# Patient Record
Sex: Male | Born: 1945
Health system: Southern US, Community
[De-identification: ages and names within clinical notes are randomized; demographics above are authoritative.]

## PROBLEM LIST (undated history)

## (undated) DIAGNOSIS — N320 Bladder-neck obstruction: Secondary | ICD-10-CM

## (undated) DIAGNOSIS — G40909 Epilepsy, unspecified, not intractable, without status epilepticus: Secondary | ICD-10-CM

## (undated) DIAGNOSIS — E7800 Pure hypercholesterolemia, unspecified: Secondary | ICD-10-CM

## (undated) DIAGNOSIS — C4491 Basal cell carcinoma of skin, unspecified: Secondary | ICD-10-CM

## (undated) DIAGNOSIS — G2581 Restless legs syndrome: Secondary | ICD-10-CM

## (undated) DIAGNOSIS — K5090 Crohn's disease, unspecified, without complications: Secondary | ICD-10-CM

## (undated) DIAGNOSIS — K219 Gastro-esophageal reflux disease without esophagitis: Secondary | ICD-10-CM

## (undated) DIAGNOSIS — J45909 Unspecified asthma, uncomplicated: Secondary | ICD-10-CM

## (undated) DIAGNOSIS — G4730 Sleep apnea, unspecified: Secondary | ICD-10-CM

## (undated) HISTORY — DX: Unspecified asthma, uncomplicated: J45.909

## (undated) HISTORY — DX: Basal cell carcinoma of skin, unspecified: C44.91

## (undated) HISTORY — DX: Gastro-esophageal reflux disease without esophagitis: K21.9

## (undated) HISTORY — DX: Bladder-neck obstruction: N32.0

## (undated) HISTORY — DX: Epilepsy, unspecified, not intractable, without status epilepticus: G40.909

## (undated) HISTORY — DX: Pure hypercholesterolemia, unspecified: E78.00

## (undated) HISTORY — DX: Sleep apnea, unspecified: G47.30

## (undated) HISTORY — DX: Crohn's disease, unspecified, without complications: K50.90

## (undated) HISTORY — DX: Restless legs syndrome: G25.81

---
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
## 1980-04-30 HISTORY — PX: KNEE ARTHROSCOPY: SUR90

## 1990-04-30 HISTORY — PX: APPENDECTOMY: SHX54

## 1997-04-30 HISTORY — PX: OTHER SURGICAL HISTORY: SHX169

## 2004-11-22 ENCOUNTER — Emergency Department: Payer: Self-pay | Admitting: General Practice

## 2004-11-23 ENCOUNTER — Ambulatory Visit: Payer: Self-pay | Admitting: General Practice

## 2005-05-02 ENCOUNTER — Ambulatory Visit: Payer: Self-pay | Admitting: Internal Medicine

## 2005-05-11 ENCOUNTER — Ambulatory Visit: Payer: Self-pay | Admitting: Internal Medicine

## 2005-07-20 ENCOUNTER — Ambulatory Visit: Payer: Self-pay | Admitting: Internal Medicine

## 2008-06-29 ENCOUNTER — Ambulatory Visit: Payer: Self-pay | Admitting: Unknown Physician Specialty

## 2010-09-27 ENCOUNTER — Ambulatory Visit: Payer: Self-pay | Admitting: Internal Medicine

## 2011-02-15 ENCOUNTER — Ambulatory Visit: Payer: Self-pay | Admitting: Oncology

## 2011-02-16 ENCOUNTER — Ambulatory Visit: Payer: Self-pay | Admitting: Oncology

## 2011-03-01 ENCOUNTER — Ambulatory Visit: Payer: Self-pay | Admitting: Oncology

## 2011-03-09 LAB — PATHOLOGY REPORT

## 2011-03-31 ENCOUNTER — Ambulatory Visit: Payer: Self-pay | Admitting: Oncology

## 2012-01-11 DIAGNOSIS — D131 Benign neoplasm of stomach: Secondary | ICD-10-CM | POA: Diagnosis not present

## 2012-01-11 DIAGNOSIS — K2940 Chronic atrophic gastritis without bleeding: Secondary | ICD-10-CM | POA: Diagnosis not present

## 2012-01-11 DIAGNOSIS — K2210 Ulcer of esophagus without bleeding: Secondary | ICD-10-CM | POA: Diagnosis not present

## 2012-02-02 DIAGNOSIS — J019 Acute sinusitis, unspecified: Secondary | ICD-10-CM | POA: Diagnosis not present

## 2012-02-02 DIAGNOSIS — J209 Acute bronchitis, unspecified: Secondary | ICD-10-CM | POA: Diagnosis not present

## 2012-02-11 ENCOUNTER — Telehealth: Payer: Self-pay | Admitting: Internal Medicine

## 2012-02-11 MED ORDER — PRAMIPEXOLE DIHYDROCHLORIDE 0.25 MG PO TABS: ORAL_TABLET | ORAL | Status: DC

## 2012-02-11 NOTE — Telephone Encounter (Signed)
Ok to refill x 3 

## 2012-02-11 NOTE — Telephone Encounter (Signed)
Pt called checking on his rx that pharmcy sent over [facility] [address] Pramipexole 0.25mg   Take 1 1/2 per day

## 2012-02-26 DIAGNOSIS — H2589 Other age-related cataract: Secondary | ICD-10-CM | POA: Diagnosis not present

## 2012-03-06 DIAGNOSIS — H2589 Other age-related cataract: Secondary | ICD-10-CM | POA: Diagnosis not present

## 2012-04-07 DIAGNOSIS — N281 Cyst of kidney, acquired: Secondary | ICD-10-CM | POA: Insufficient documentation

## 2012-04-07 DIAGNOSIS — R972 Elevated prostate specific antigen [PSA]: Secondary | ICD-10-CM | POA: Insufficient documentation

## 2012-04-07 DIAGNOSIS — N401 Enlarged prostate with lower urinary tract symptoms: Secondary | ICD-10-CM | POA: Insufficient documentation

## 2012-04-07 DIAGNOSIS — N411 Chronic prostatitis: Secondary | ICD-10-CM | POA: Insufficient documentation

## 2012-04-09 DIAGNOSIS — N281 Cyst of kidney, acquired: Secondary | ICD-10-CM | POA: Diagnosis not present

## 2012-04-09 DIAGNOSIS — R972 Elevated prostate specific antigen [PSA]: Secondary | ICD-10-CM | POA: Diagnosis not present

## 2012-04-09 DIAGNOSIS — N411 Chronic prostatitis: Secondary | ICD-10-CM | POA: Diagnosis not present

## 2012-04-09 DIAGNOSIS — N401 Enlarged prostate with lower urinary tract symptoms: Secondary | ICD-10-CM | POA: Diagnosis not present

## 2012-04-29 ENCOUNTER — Encounter: Payer: Self-pay | Admitting: Internal Medicine

## 2012-04-29 ENCOUNTER — Ambulatory Visit (INDEPENDENT_AMBULATORY_CARE_PROVIDER_SITE_OTHER): Payer: Medicare Other | Admitting: Internal Medicine

## 2012-04-29 VITALS — BP 112/68 | HR 63 | Temp 98.2°F | Ht 69.0 in | Wt 232.5 lb

## 2012-04-29 DIAGNOSIS — K219 Gastro-esophageal reflux disease without esophagitis: Secondary | ICD-10-CM | POA: Insufficient documentation

## 2012-04-29 DIAGNOSIS — G4730 Sleep apnea, unspecified: Secondary | ICD-10-CM | POA: Insufficient documentation

## 2012-04-29 DIAGNOSIS — M810 Age-related osteoporosis without current pathological fracture: Secondary | ICD-10-CM | POA: Diagnosis not present

## 2012-04-29 DIAGNOSIS — R945 Abnormal results of liver function studies: Secondary | ICD-10-CM

## 2012-04-29 DIAGNOSIS — J45909 Unspecified asthma, uncomplicated: Secondary | ICD-10-CM

## 2012-04-29 DIAGNOSIS — R635 Abnormal weight gain: Secondary | ICD-10-CM

## 2012-04-29 DIAGNOSIS — G2581 Restless legs syndrome: Secondary | ICD-10-CM

## 2012-04-29 DIAGNOSIS — R7989 Other specified abnormal findings of blood chemistry: Secondary | ICD-10-CM

## 2012-04-29 DIAGNOSIS — E7800 Pure hypercholesterolemia, unspecified: Secondary | ICD-10-CM | POA: Diagnosis not present

## 2012-05-05 ENCOUNTER — Other Ambulatory Visit (INDEPENDENT_AMBULATORY_CARE_PROVIDER_SITE_OTHER): Payer: Medicare Other

## 2012-05-05 DIAGNOSIS — R635 Abnormal weight gain: Secondary | ICD-10-CM

## 2012-05-05 DIAGNOSIS — E7800 Pure hypercholesterolemia, unspecified: Secondary | ICD-10-CM | POA: Diagnosis not present

## 2012-05-05 DIAGNOSIS — M810 Age-related osteoporosis without current pathological fracture: Secondary | ICD-10-CM

## 2012-05-05 LAB — CBC WITH DIFFERENTIAL/PLATELET
Basophils Relative: 1 % (ref 0.0–3.0)
Eosinophils Absolute: 0.2 10*3/uL (ref 0.0–0.7)
HCT: 40.8 % (ref 39.0–52.0)
Hemoglobin: 13.7 g/dL (ref 13.0–17.0)
Lymphs Abs: 1.2 10*3/uL (ref 0.7–4.0)
MCHC: 33.5 g/dL (ref 30.0–36.0)
MCV: 86.8 fl (ref 78.0–100.0)
Monocytes Absolute: 0.5 10*3/uL (ref 0.1–1.0)
Neutro Abs: 2.2 10*3/uL (ref 1.4–7.7)
RBC: 4.7 Mil/uL (ref 4.22–5.81)

## 2012-05-05 LAB — COMPREHENSIVE METABOLIC PANEL
AST: 59 U/L — ABNORMAL HIGH (ref 0–37)
Alkaline Phosphatase: 64 U/L (ref 39–117)
BUN: 15 mg/dL (ref 6–23)
Calcium: 9 mg/dL (ref 8.4–10.5)
Chloride: 103 mEq/L (ref 96–112)
Creatinine, Ser: 1.2 mg/dL (ref 0.4–1.5)
Total Bilirubin: 0.7 mg/dL (ref 0.3–1.2)

## 2012-05-05 LAB — LIPID PANEL
Cholesterol: 232 mg/dL — ABNORMAL HIGH (ref 0–200)
Total CHOL/HDL Ratio: 5
Triglycerides: 127 mg/dL (ref 0.0–149.0)

## 2012-05-06 ENCOUNTER — Encounter: Payer: Self-pay | Admitting: Internal Medicine

## 2012-05-06 ENCOUNTER — Telehealth: Payer: Self-pay | Admitting: Internal Medicine

## 2012-05-06 DIAGNOSIS — R7989 Other specified abnormal findings of blood chemistry: Secondary | ICD-10-CM

## 2012-05-06 DIAGNOSIS — D72819 Decreased white blood cell count, unspecified: Secondary | ICD-10-CM

## 2012-05-06 DIAGNOSIS — R945 Abnormal results of liver function studies: Secondary | ICD-10-CM

## 2012-05-06 NOTE — Telephone Encounter (Signed)
Pt notified of labs (via my chart) and need of a follow up non fasting lab within the next 2-3 weeks.  Please schedule an appt time for him to come in for non fasting labs and notify him of time.  Thanks.

## 2012-05-06 NOTE — Telephone Encounter (Signed)
Appointment 1/22 @ 3  Pt aware of appointment

## 2012-05-19 ENCOUNTER — Encounter: Payer: Self-pay | Admitting: Internal Medicine

## 2012-05-19 DIAGNOSIS — M810 Age-related osteoporosis without current pathological fracture: Secondary | ICD-10-CM | POA: Insufficient documentation

## 2012-05-19 DIAGNOSIS — R945 Abnormal results of liver function studies: Secondary | ICD-10-CM | POA: Insufficient documentation

## 2012-05-19 DIAGNOSIS — R7989 Other specified abnormal findings of blood chemistry: Secondary | ICD-10-CM | POA: Insufficient documentation

## 2012-05-19 NOTE — Assessment & Plan Note (Signed)
Low cholesterol diet.  Desires not to take cholesterol medication.  Check lipid panel.  

## 2012-05-19 NOTE — Assessment & Plan Note (Signed)
Recheck liver panel to confirm stable.

## 2012-05-19 NOTE — Assessment & Plan Note (Signed)
Doing well.  Continue inhalers.  Follow.   

## 2012-05-19 NOTE — Assessment & Plan Note (Signed)
CPAP.  [place].    

## 2012-05-19 NOTE — Assessment & Plan Note (Signed)
Takes calcium and vitamin D.  Desires no other medication.  Recheck bone density.  Check vitamin D level.

## 2012-05-19 NOTE — Assessment & Plan Note (Signed)
Symptoms controlled.  Follow.  Ulceration healed.    

## 2012-05-19 NOTE — Assessment & Plan Note (Signed)
On Requip.  Doing well.  Follow.   

## 2012-05-19 NOTE — Progress Notes (Signed)
  Subjective:    Patient ID: Joseph Kitten., male    DOB: 1945/05/19, 67 y.o.   MRN: [id number]  HPI 67 year old male with past history of asthma/reactive airways disease, sleep apnea, restless leg syndrome and hypercholesterolemia who comes in today for a physical exam.  He states he has been doing well.  The previous ulceration - healed.  He is eating and drinking well.  Not watching his diet.  No sob.  No chest pain or tightness.  Bowels doing well.  Has been seeing Dr Achilles Dunk for elevated PSA.  Highest 8.6.  Treated with cipro - 6.1.  Biopsy negative.  Treated with finasteride - psa 4.3.  Overall he feels good.  Had cataract extraction.  Doing well.    Past Medical History  Diagnosis Date  . Asthma   . Hypercholesterolemia   . Bladder outlet obstruction   . GERD (gastroesophageal reflux disease)     schatzki ring  . Skin cancer, basal cell   . Crohn's disease     appendix, s/p appendectomy  . Restless leg syndrome   . Seizure disorder     age 57, previously on phenobarbital  . Sleep apnea     Review of Systems Patient denies any headache, lightheadedness or dizziness.  No sinus or allergy symptoms.  No chest pain, tightness or palpitations.  No increased shortness of breath, cough or congestion.  No nausea or vomiting.  No acid reflux.  No dysphagia.  No abdominal pain or cramping.  No bowel change, such as diarrhea, constipation, BRBPR or melana.  No urine change.        Objective:   Physical Exam Filed Vitals:   04/29/12 1520  BP: 112/68  Pulse: 63  Temp: 98.2 F (73.39 C)   67 year old male in no acute distress.  HEENT:  Nares - clear.  Oropharynx - without lesions. NECK:  Supple.  Nontender.  No audible carotid bruit.  HEART:  Appears to be regular.   LUNGS:  No crackles or wheezing audible.  Respirations even and unlabored.   RADIAL PULSE:  Equal bilaterally.  ABDOMEN:  Soft.  Nontender.  Bowel sounds present and normal.  No audible abdominal bruit.  GU:  Exam  performed by Dr Achilles Dunk.    EXTREMITIES:  No increased edema present.  DP pulses palpable and equal bilaterally.         Assessment & Plan:  ABDOMINAL WALL HERNIA.  Repair by Dr Ashley Murrain.  Doing well.    CARDIOVASCULAR.  Asymptomatic.    HEALTH MAINTENANCE.  Physical today.  PSA and prostate checks - followed by Dr Achilles Dunk.  Colonoscopy just performed 2013.

## 2012-05-20 ENCOUNTER — Other Ambulatory Visit: Payer: Medicare Other

## 2012-05-21 ENCOUNTER — Other Ambulatory Visit: Payer: Medicare Other

## 2012-05-21 ENCOUNTER — Other Ambulatory Visit (INDEPENDENT_AMBULATORY_CARE_PROVIDER_SITE_OTHER): Payer: Medicare Other

## 2012-05-21 DIAGNOSIS — M810 Age-related osteoporosis without current pathological fracture: Secondary | ICD-10-CM | POA: Diagnosis not present

## 2012-05-21 DIAGNOSIS — R7989 Other specified abnormal findings of blood chemistry: Secondary | ICD-10-CM | POA: Diagnosis not present

## 2012-05-21 DIAGNOSIS — R945 Abnormal results of liver function studies: Secondary | ICD-10-CM

## 2012-05-21 DIAGNOSIS — D72819 Decreased white blood cell count, unspecified: Secondary | ICD-10-CM

## 2012-05-21 LAB — HEPATIC FUNCTION PANEL
Alkaline Phosphatase: 69 U/L (ref 39–117)
Bilirubin, Direct: 0 mg/dL (ref 0.0–0.3)
Total Bilirubin: 0.6 mg/dL (ref 0.3–1.2)

## 2012-05-21 LAB — CBC WITH DIFFERENTIAL/PLATELET
Basophils Absolute: 0 10*3/uL (ref 0.0–0.1)
Basophils Relative: 0.7 % (ref 0.0–3.0)
Eosinophils Absolute: 0.1 10*3/uL (ref 0.0–0.7)
Lymphocytes Relative: 34.4 % (ref 12.0–46.0)
MCHC: 33.6 g/dL (ref 30.0–36.0)
Neutrophils Relative %: 54.2 % (ref 43.0–77.0)
Platelets: 235 10*3/uL (ref 150.0–400.0)
RBC: 4.66 Mil/uL (ref 4.22–5.81)
RDW: 15.7 % — ABNORMAL HIGH (ref 11.5–14.6)

## 2012-05-22 ENCOUNTER — Telehealth: Payer: Self-pay | Admitting: Internal Medicine

## 2012-05-22 DIAGNOSIS — E7800 Pure hypercholesterolemia, unspecified: Secondary | ICD-10-CM

## 2012-05-22 DIAGNOSIS — R945 Abnormal results of liver function studies: Secondary | ICD-10-CM

## 2012-05-22 DIAGNOSIS — R7989 Other specified abnormal findings of blood chemistry: Secondary | ICD-10-CM

## 2012-05-22 NOTE — Telephone Encounter (Signed)
Pt aware of appointment 

## 2012-05-22 NOTE — Telephone Encounter (Signed)
I notified the pt of his labs via my chart.  He needs a follow up lab appt 1-2 days before his 10/17/12 appt.  Please schedule a fasting lab appt and notify him of the date and time.  Thanks.

## 2012-05-22 NOTE — Telephone Encounter (Signed)
Left message for pt to call office needs lab appointment prior to appointment 10/17/12

## 2012-05-22 NOTE — Telephone Encounter (Signed)
Scheduled

## 2012-05-27 ENCOUNTER — Telehealth: Payer: Self-pay | Admitting: Internal Medicine

## 2012-05-27 NOTE — Telephone Encounter (Signed)
Pt notified of bone density results via my chart.  (osteoporosis - improvement)

## 2012-06-04 ENCOUNTER — Encounter: Payer: Self-pay | Admitting: Internal Medicine

## 2012-06-14 ENCOUNTER — Other Ambulatory Visit: Payer: Self-pay

## 2012-07-25 ENCOUNTER — Ambulatory Visit: Payer: Federal, State, Local not specified - PPO | Admitting: Internal Medicine

## 2012-08-28 DIAGNOSIS — R945 Abnormal results of liver function studies: Secondary | ICD-10-CM | POA: Diagnosis not present

## 2012-08-28 DIAGNOSIS — Z713 Dietary counseling and surveillance: Secondary | ICD-10-CM | POA: Diagnosis not present

## 2012-08-28 DIAGNOSIS — R197 Diarrhea, unspecified: Secondary | ICD-10-CM | POA: Diagnosis not present

## 2012-08-28 DIAGNOSIS — K5090 Crohn's disease, unspecified, without complications: Secondary | ICD-10-CM | POA: Diagnosis not present

## 2012-09-03 ENCOUNTER — Ambulatory Visit: Payer: Medicare Other | Admitting: Internal Medicine

## 2012-09-05 ENCOUNTER — Other Ambulatory Visit: Payer: Self-pay | Admitting: *Deleted

## 2012-09-05 MED ORDER — PRAMIPEXOLE DIHYDROCHLORIDE 0.25 MG PO TABS: ORAL_TABLET | ORAL | Status: DC

## 2012-09-05 NOTE — Telephone Encounter (Signed)
Please Advise.... Pt has not been to be seen since 03/2012

## 2012-09-10 DIAGNOSIS — L738 Other specified follicular disorders: Secondary | ICD-10-CM | POA: Diagnosis not present

## 2012-09-10 DIAGNOSIS — Z1283 Encounter for screening for malignant neoplasm of skin: Secondary | ICD-10-CM | POA: Diagnosis not present

## 2012-09-10 DIAGNOSIS — L821 Other seborrheic keratosis: Secondary | ICD-10-CM | POA: Diagnosis not present

## 2012-09-10 DIAGNOSIS — D239 Other benign neoplasm of skin, unspecified: Secondary | ICD-10-CM | POA: Diagnosis not present

## 2012-09-22 ENCOUNTER — Other Ambulatory Visit: Payer: Self-pay | Admitting: Internal Medicine

## 2012-09-23 NOTE — Telephone Encounter (Signed)
Rx sent to pharmacy by escript  

## 2012-10-10 ENCOUNTER — Encounter: Payer: Self-pay | Admitting: Internal Medicine

## 2012-10-10 ENCOUNTER — Other Ambulatory Visit (INDEPENDENT_AMBULATORY_CARE_PROVIDER_SITE_OTHER): Payer: Medicare Other

## 2012-10-10 DIAGNOSIS — E7800 Pure hypercholesterolemia, unspecified: Secondary | ICD-10-CM | POA: Diagnosis not present

## 2012-10-10 DIAGNOSIS — R7989 Other specified abnormal findings of blood chemistry: Secondary | ICD-10-CM | POA: Diagnosis not present

## 2012-10-10 DIAGNOSIS — R945 Abnormal results of liver function studies: Secondary | ICD-10-CM

## 2012-10-10 LAB — HEPATIC FUNCTION PANEL
AST: 46 U/L — ABNORMAL HIGH (ref 0–37)
Albumin: 3.4 g/dL — ABNORMAL LOW (ref 3.5–5.2)
Alkaline Phosphatase: 68 U/L (ref 39–117)
Total Bilirubin: 0.8 mg/dL (ref 0.3–1.2)

## 2012-10-10 LAB — LIPID PANEL
HDL: 41.9 mg/dL (ref >39.00)
LDL Cholesterol: 130 mg/dL — ABNORMAL HIGH (ref 0–99)
Total CHOL/HDL Ratio: 5
Triglycerides: 127 mg/dL (ref 0.0–149.0)
VLDL: 25.4 mg/dL (ref 0.0–40.0)

## 2012-10-17 ENCOUNTER — Ambulatory Visit: Payer: Medicare Other | Admitting: Internal Medicine

## 2012-10-22 DIAGNOSIS — N281 Cyst of kidney, acquired: Secondary | ICD-10-CM | POA: Diagnosis not present

## 2012-10-22 DIAGNOSIS — N411 Chronic prostatitis: Secondary | ICD-10-CM | POA: Diagnosis not present

## 2012-10-22 DIAGNOSIS — N401 Enlarged prostate with lower urinary tract symptoms: Secondary | ICD-10-CM | POA: Diagnosis not present

## 2012-10-22 DIAGNOSIS — R972 Elevated prostate specific antigen [PSA]: Secondary | ICD-10-CM | POA: Diagnosis not present

## 2012-10-22 DIAGNOSIS — N139 Obstructive and reflux uropathy, unspecified: Secondary | ICD-10-CM | POA: Diagnosis not present

## 2012-11-10 DIAGNOSIS — R197 Diarrhea, unspecified: Secondary | ICD-10-CM | POA: Diagnosis not present

## 2012-11-13 ENCOUNTER — Encounter: Payer: Self-pay | Admitting: Internal Medicine

## 2012-11-13 ENCOUNTER — Ambulatory Visit (INDEPENDENT_AMBULATORY_CARE_PROVIDER_SITE_OTHER): Payer: Medicare Other | Admitting: Internal Medicine

## 2012-11-13 VITALS — BP 130/90 | HR 58 | Temp 97.9°F | Ht 69.0 in | Wt 219.5 lb

## 2012-11-13 DIAGNOSIS — E7800 Pure hypercholesterolemia, unspecified: Secondary | ICD-10-CM

## 2012-11-13 DIAGNOSIS — M810 Age-related osteoporosis without current pathological fracture: Secondary | ICD-10-CM

## 2012-11-13 DIAGNOSIS — K219 Gastro-esophageal reflux disease without esophagitis: Secondary | ICD-10-CM | POA: Diagnosis not present

## 2012-11-13 DIAGNOSIS — G2581 Restless legs syndrome: Secondary | ICD-10-CM

## 2012-11-13 DIAGNOSIS — R7989 Other specified abnormal findings of blood chemistry: Secondary | ICD-10-CM

## 2012-11-13 DIAGNOSIS — R5383 Other fatigue: Secondary | ICD-10-CM

## 2012-11-13 DIAGNOSIS — G4730 Sleep apnea, unspecified: Secondary | ICD-10-CM

## 2012-11-13 DIAGNOSIS — R945 Abnormal results of liver function studies: Secondary | ICD-10-CM

## 2012-11-13 DIAGNOSIS — R5381 Other malaise: Secondary | ICD-10-CM

## 2012-11-15 ENCOUNTER — Encounter: Payer: Self-pay | Admitting: Internal Medicine

## 2012-11-15 NOTE — Assessment & Plan Note (Signed)
Symptoms controlled.  Follow.  Ulceration healed.    

## 2012-11-15 NOTE — Assessment & Plan Note (Signed)
CPAP.  [place].    

## 2012-11-15 NOTE — Addendum Note (Signed)
Addended by: Charm Barges on: 11/15/2012 10:40 PM   Modules accepted: Orders

## 2012-11-15 NOTE — Assessment & Plan Note (Signed)
On Requip.  Doing well.  Follow.   

## 2012-11-15 NOTE — Assessment & Plan Note (Signed)
Low cholesterol diet.  Desires not to take cholesterol medication.  Follow lipid panel.  Lipid panel 10/10/12 revealed total cholesterol 197, triglycerides 127, HDL 42 and LDL 130.  Better.

## 2012-11-15 NOTE — Assessment & Plan Note (Signed)
Takes calcium and vitamin D.  Desires no other medication.  Follow vitamin D level.    

## 2012-11-15 NOTE — Assessment & Plan Note (Signed)
Doing well.  Continue inhalers.  Follow.   

## 2012-11-15 NOTE — Progress Notes (Signed)
Subjective:    Patient ID: Joseph Kitten., male    DOB: Jan 06, 1946, 67 y.o.   MRN: [id number]  HPI 67 year old male with past history of asthma/reactive airways disease, sleep apnea, restless leg syndrome and hypercholesterolemia who comes in today for a scheduled follow up.  He states he has been doing well.  The previous ulceration - healed.  He is eating and drinking well.  Trying to watch his diet.  Once a month for the previous five months, he  Has had some diarrhea.  Self limited.  First two episodes were associated with fever (101.8).  Minimal nausea.  Decreased appetite - during the episode.  No abdominal pain.  Once the flare has resolved, symptoms return to normal.  Saw Dr Barbie Banner [facility]).  Recently turned in stool studies.  Does not have results yet.  Better currently.  No sob.  No chest pain or tightness.  Has been seeing Dr Achilles Dunk for elevated PSA.  Highest 8.6.  Treated with cipro - 6.1.  Biopsy negative.  Treated with finasteride - psa 4.3.  Overall he feels good.  Had cataract extraction.  Doing well.  Has well water.  Tested negative.  He does have an area on his left foot.  Itches.  He scratches a lot.     Past Medical History  Diagnosis Date  . Asthma   . Hypercholesterolemia   . Bladder outlet obstruction   . GERD (gastroesophageal reflux disease)     schatzki ring  . Skin cancer, basal cell   . Crohn's disease     appendix, s/p appendectomy  . Restless leg syndrome   . Seizure disorder     age 38, previously on phenobarbital  . Sleep apnea     Outpatient Encounter Prescriptions as of 11/13/2012  Medication Sig Dispense Refill  . ADVAIR DISKUS 100-50 MCG/DOSE AEPB INHALE 1 PUFF EVERY DAY  180 each  0  . albuterol (PROVENTIL) 2 MG tablet Take 2 mg by mouth as needed.      . calcium gluconate 500 MG tablet Take 500 mg by mouth 2 (two) times daily.      . Cholecalciferol (VITAMIN D-3) 1000 UNITS CAPS Take 1 capsule by mouth daily.      . finasteride (PROSCAR) 5 MG  tablet Take 5 mg by mouth daily.      . Magnesium 250 MG TABS Two tablets q day      . omeprazole (PRILOSEC) 20 MG capsule Take 20 mg by mouth daily.      . pramipexole (MIRAPEX) 0.25 MG tablet Take 1 and 1/2 tablets daily  45 tablet  3  . [DISCONTINUED] aspirin 81 MG tablet Take 81 mg by mouth daily.      . [DISCONTINUED] fish oil-omega-3 fatty acids 1000 MG capsule Take 1 g by mouth daily.      . [DISCONTINUED] Flaxseed, Linseed, (FLAX SEED OIL) 1000 MG CAPS Take 1 capsule by mouth 2 (two) times daily.       No facility-administered encounter medications on file as of 11/13/2012.    Review of Systems Patient denies any headache, lightheadedness or dizziness.  No sinus or allergy symptoms.  No chest pain, tightness or palpitations.  No increased shortness of breath, cough or congestion.  Minimal nausea.  Decreased appetite - associated with these episodes.  No acid reflux.  No dysphagia.  No abdominal pain or cramping.  No bowel change, such as constipation, BRBPR or melana.  Does report some  intermittent diarrhea as outlined.  Left foot itching as outlined.  No urine change.        Objective:   Physical Exam  Filed Vitals:   11/13/12 1111  BP: 130/90  Pulse: 58  Temp: 97.9 F (36.6 C)   Blood pressure recheck;  132/78, pulse 90  67 year old male in no acute distress.  HEENT:  Nares - clear.  Oropharynx - without lesions. NECK:  Supple.  Nontender.  No audible carotid bruit.  HEART:  Appears to be regular.   LUNGS:  No crackles or wheezing audible.  Respirations even and unlabored.   RADIAL PULSE:  Equal bilaterally.  ABDOMEN:  Soft.  Nontender.  Bowel sounds present and normal.  No audible abdominal bruit.    EXTREMITIES:  No increased edema present.  DP pulses palpable and equal bilaterally.         Assessment & Plan:  DIARRHEA.  Persistent intermittent episodes.  Add Align daily.  Await stool test results.  Continue to follow up with Dr Barbie Banner.    LEFT FOOT ITCHING.   Unclear as the exact etiology. Apply Lotrimin cream bid.  Avoid scratching.  Keep dry.   ABDOMINAL WALL HERNIA.  Repair by Dr Ashley Murrain.  Doing well.    CARDIOVASCULAR.  Asymptomatic.    HEALTH MAINTENANCE.  Physical last visit.  PSA and prostate checks - followed by Dr Achilles Dunk.  Colonoscopy just performed 2013.

## 2012-11-15 NOTE — Assessment & Plan Note (Signed)
Follow liver panel to confirm stable.   

## 2012-11-30 ENCOUNTER — Other Ambulatory Visit: Payer: Self-pay | Admitting: Internal Medicine

## 2012-12-03 ENCOUNTER — Other Ambulatory Visit: Payer: Self-pay

## 2013-01-05 DIAGNOSIS — Z961 Presence of intraocular lens: Secondary | ICD-10-CM | POA: Diagnosis not present

## 2013-01-20 ENCOUNTER — Encounter: Payer: Self-pay | Admitting: Adult Health

## 2013-01-20 ENCOUNTER — Ambulatory Visit (INDEPENDENT_AMBULATORY_CARE_PROVIDER_SITE_OTHER): Payer: Medicare Other | Admitting: Adult Health

## 2013-01-20 VITALS — BP 124/74 | HR 62 | Temp 98.9°F | Resp 12 | Wt 222.0 lb

## 2013-01-20 DIAGNOSIS — J329 Chronic sinusitis, unspecified: Secondary | ICD-10-CM

## 2013-01-20 MED ORDER — AMOXICILLIN-POT CLAVULANATE 875-125 MG PO TABS: 1.0000 | ORAL_TABLET | Freq: Two times a day (BID) | ORAL | Status: DC

## 2013-01-20 NOTE — Progress Notes (Signed)
  Subjective:    Patient ID: Joseph Good., male    DOB: 02-12-1946, 67 y.o.   MRN: [id number]  HPI  Thursday and Fri - sore throat and low grade fever Saturday - sinus congestions Yesterday began with some chest congestion and coughing up yellow. Coughing during the night  He has taken robitussin with some improvement.  Review of Systems  Constitutional: Positive for fever.  HENT: Positive for congestion, rhinorrhea, postnasal drip and sinus pressure. Negative for sore throat.   Respiratory: Positive for cough. Negative for shortness of breath and wheezing.   Cardiovascular: Negative.   Gastrointestinal: Negative.   Psychiatric/Behavioral: Negative.        Objective:   Physical Exam  Constitutional: He is oriented to person, place, and time. He appears well-developed and well-nourished. No distress.  HENT:  Head: Normocephalic and atraumatic.  Mouth/Throat: No oropharyngeal exudate.  Cardiovascular: Normal rate, regular rhythm, normal heart sounds and intact distal pulses.  Exam reveals no gallop and no friction rub.   No murmur heard. Pulmonary/Chest: Effort normal and breath sounds normal. No respiratory distress. He has no wheezes. He has no rales.  Lymphadenopathy:    He has no cervical adenopathy.  Neurological: He is alert and oriented to person, place, and time.  Psychiatric: He has a normal mood and affect. His behavior is normal. Judgment and thought content normal.          Assessment & Plan:

## 2013-01-20 NOTE — Patient Instructions (Addendum)
  Start augmentin twice a day for 10 days  Continue Robitussin for cough and expectorant   Please call if no improvement in 3-4 days.

## 2013-01-20 NOTE — Assessment & Plan Note (Signed)
Start Augmentin bid x 10 days. Continue with Robitussin for cough. RTC if no improvement in 3-4 days.

## 2013-01-21 ENCOUNTER — Telehealth: Payer: Self-pay | Admitting: Internal Medicine

## 2013-01-21 NOTE — Telephone Encounter (Signed)
Discussed with pt that a diagnosis is on his visit note.

## 2013-01-21 NOTE — Telephone Encounter (Signed)
Pt saw Raquel 9/23.  States on discharge paperwork the diagnosis section states "none".  Pt is concerned about this regarding insurance and would like a call.

## 2013-01-28 ENCOUNTER — Encounter: Payer: Self-pay | Admitting: Adult Health

## 2013-01-28 ENCOUNTER — Ambulatory Visit (INDEPENDENT_AMBULATORY_CARE_PROVIDER_SITE_OTHER): Payer: Medicare Other | Admitting: Adult Health

## 2013-01-28 ENCOUNTER — Ambulatory Visit (INDEPENDENT_AMBULATORY_CARE_PROVIDER_SITE_OTHER)
Admission: RE | Admit: 2013-01-28 | Discharge: 2013-01-28 | Disposition: A | Discharge status: Home / Self Care | Payer: Medicare Other | Source: Ambulatory Visit | Attending: Adult Health | Admitting: Adult Health

## 2013-01-28 VITALS — BP 122/72 | HR 60 | Temp 98.1°F | Resp 12 | Wt 225.0 lb

## 2013-01-28 DIAGNOSIS — R05 Cough: Secondary | ICD-10-CM

## 2013-01-28 DIAGNOSIS — R059 Cough, unspecified: Secondary | ICD-10-CM

## 2013-01-28 MED ORDER — LEVOFLOXACIN 500 MG PO TABS: 500.0000 mg | ORAL_TABLET | Freq: Every day | ORAL | Status: DC

## 2013-01-28 MED ORDER — PREDNISONE 10 MG PO TABS: ORAL_TABLET | ORAL | Status: DC

## 2013-01-28 NOTE — Progress Notes (Signed)
  Subjective:    Patient ID: Joseph Good., male    DOB: 23-Jan-1946, 67 y.o.   MRN: [id number]  HPI  Patient is a pleasant 67 year old male who presents to clinic with complaints of little improvement on Augmentin. He is still coughing. On Augmentin x 8 days. He was last seen in clinic on 01/20/2013 and started on Augmentin for what appeared to be symptoms of sinusitis. During previous visit patient was complaining of sore throat, low-grade fever, sinus congestion as well as some chest congestion with productive cough of yellow sputum. Some of those symptoms have improved however the coughing persists. He no longer has a low-grade fever. He is still having sinus congestion and mild chest congestion.    Current Outpatient Prescriptions on File Prior to Visit  Medication Sig Dispense Refill  . ADVAIR DISKUS 100-50 MCG/DOSE AEPB INHALE 1 PUFF EVERY DAY  180 each  0  . albuterol (PROVENTIL) 2 MG tablet Take 2 mg by mouth as needed.      . calcium gluconate 500 MG tablet Take 500 mg by mouth 2 (two) times daily.      . Cholecalciferol (VITAMIN D-3) 1000 UNITS CAPS Take 1 capsule by mouth daily.      . finasteride (PROSCAR) 5 MG tablet Take 5 mg by mouth daily.      . Magnesium 250 MG TABS Two tablets q day      . omeprazole (PRILOSEC) 20 MG capsule Take 20 mg by mouth daily.      . pramipexole (MIRAPEX) 0.25 MG tablet TAKE 1 AND 1/2 TABLETS DAILY  45 tablet  5   No current facility-administered medications on file prior to visit.    Review of Systems  HENT: Positive for congestion, rhinorrhea, postnasal drip and sinus pressure.   Respiratory: Positive for cough. Negative for shortness of breath and wheezing.        Objective:   Physical Exam  Constitutional: He is oriented to person, place, and time. He appears well-developed and well-nourished. No distress.  Cardiovascular: Normal rate, regular rhythm and normal heart sounds.  Exam reveals no gallop and no friction rub.   No murmur  heard. Pulmonary/Chest: Effort normal and breath sounds normal. No respiratory distress. He has no wheezes. He has no rales.  Pt observed to have a congested cough.  Neurological: He is alert and oriented to person, place, and time.  Psychiatric: He has a normal mood and affect. His behavior is normal. Judgment and thought content normal.    BP 122/72  Pulse 60  Temp(Src) 98.1 F (36.7 C) (Oral)  Resp 12  Wt 225 lb (102.059 kg)  BMI 33.21 kg/m2  SpO2 97%       Assessment & Plan:

## 2013-01-28 NOTE — Patient Instructions (Addendum)
  Please go to our [facility] for chest xray.  I will notify you of results once I obtain them.  Stop Augmenting and start Levaquin 500 mg for 10 days.  Also start prednisone taper: Start with 60 mg (6 tablets) on the first day and taper by 10 mg (1 tablet) daily until complete  Drink plenty of fluids.  Albuterol inhaler every 6 hours for the next 3 days then as needed.  Continue with your cough syrup.

## 2013-01-28 NOTE — Assessment & Plan Note (Signed)
[facility] office for chest x-ray. Stop Augmentin and start Levaquin 500 mg daily x10 days. Continue with Robitussin for cough. Return to clinic if symptoms are not improved within 4-5 days.

## 2013-02-06 DIAGNOSIS — H5710 Ocular pain, unspecified eye: Secondary | ICD-10-CM | POA: Diagnosis not present

## 2013-03-05 ENCOUNTER — Other Ambulatory Visit: Payer: Self-pay

## 2013-05-08 ENCOUNTER — Other Ambulatory Visit: Payer: Self-pay | Admitting: Internal Medicine

## 2013-05-14 DIAGNOSIS — R972 Elevated prostate specific antigen [PSA]: Secondary | ICD-10-CM | POA: Diagnosis not present

## 2013-05-14 DIAGNOSIS — N401 Enlarged prostate with lower urinary tract symptoms: Secondary | ICD-10-CM | POA: Diagnosis not present

## 2013-05-14 DIAGNOSIS — N411 Chronic prostatitis: Secondary | ICD-10-CM | POA: Diagnosis not present

## 2013-05-14 DIAGNOSIS — N281 Cyst of kidney, acquired: Secondary | ICD-10-CM | POA: Diagnosis not present

## 2013-05-22 ENCOUNTER — Other Ambulatory Visit (INDEPENDENT_AMBULATORY_CARE_PROVIDER_SITE_OTHER): Payer: Medicare Other

## 2013-05-22 DIAGNOSIS — R5381 Other malaise: Secondary | ICD-10-CM | POA: Diagnosis not present

## 2013-05-22 DIAGNOSIS — R7989 Other specified abnormal findings of blood chemistry: Secondary | ICD-10-CM

## 2013-05-22 DIAGNOSIS — E7800 Pure hypercholesterolemia, unspecified: Secondary | ICD-10-CM

## 2013-05-22 DIAGNOSIS — G2581 Restless legs syndrome: Secondary | ICD-10-CM | POA: Diagnosis not present

## 2013-05-22 DIAGNOSIS — R5383 Other fatigue: Secondary | ICD-10-CM

## 2013-05-22 DIAGNOSIS — R945 Abnormal results of liver function studies: Secondary | ICD-10-CM

## 2013-05-22 LAB — COMPREHENSIVE METABOLIC PANEL
ALBUMIN: 3.8 g/dL (ref 3.5–5.2)
ALT: 23 U/L (ref 0–53)
AST: 57 U/L — ABNORMAL HIGH (ref 0–37)
Alkaline Phosphatase: 64 U/L (ref 39–117)
BUN: 13 mg/dL (ref 6–23)
CO2: 30 meq/L (ref 19–32)
Calcium: 9 mg/dL (ref 8.4–10.5)
Chloride: 103 mEq/L (ref 96–112)
Creatinine, Ser: 1.2 mg/dL (ref 0.4–1.5)
GFR: 63.1 mL/min (ref >60.00)
GLUCOSE: 86 mg/dL (ref 70–99)
POTASSIUM: 4.6 meq/L (ref 3.5–5.1)
SODIUM: 139 meq/L (ref 135–145)
TOTAL PROTEIN: 7.1 g/dL (ref 6.0–8.3)
Total Bilirubin: 1 mg/dL (ref 0.3–1.2)

## 2013-05-22 LAB — CBC WITH DIFFERENTIAL/PLATELET
BASOS PCT: 0.4 % (ref 0.0–3.0)
Basophils Absolute: 0 10*3/uL (ref 0.0–0.1)
EOS PCT: 3.3 % (ref 0.0–5.0)
Eosinophils Absolute: 0.2 10*3/uL (ref 0.0–0.7)
HCT: 45.5 % (ref 39.0–52.0)
Hemoglobin: 15.7 g/dL (ref 13.0–17.0)
Lymphocytes Relative: 25.4 % (ref 12.0–46.0)
Lymphs Abs: 1.2 10*3/uL (ref 0.7–4.0)
MCHC: 34.4 g/dL (ref 30.0–36.0)
MCV: 91.6 fl (ref 78.0–100.0)
MONO ABS: 0.5 10*3/uL (ref 0.1–1.0)
MONOS PCT: 11.3 % (ref 3.0–12.0)
Neutro Abs: 2.9 10*3/uL (ref 1.4–7.7)
Neutrophils Relative %: 59.6 % (ref 43.0–77.0)
PLATELETS: 230 10*3/uL (ref 150.0–400.0)
RBC: 4.97 Mil/uL (ref 4.22–5.81)
RDW: 13.8 % (ref 11.5–14.6)
WBC: 4.8 10*3/uL (ref 4.5–10.5)

## 2013-05-22 LAB — LIPID PANEL
Cholesterol: 213 mg/dL — ABNORMAL HIGH (ref 0–200)
HDL: 41.5 mg/dL (ref >39.00)
Total CHOL/HDL Ratio: 5
Triglycerides: 100 mg/dL (ref 0.0–149.0)
VLDL: 20 mg/dL (ref 0.0–40.0)

## 2013-05-22 LAB — TSH: TSH: 1.26 u[IU]/mL (ref 0.35–5.50)

## 2013-05-22 LAB — LDL CHOLESTEROL, DIRECT: Direct LDL: 154.7 mg/dL

## 2013-05-23 ENCOUNTER — Encounter: Payer: Self-pay | Admitting: Internal Medicine

## 2013-05-23 ENCOUNTER — Other Ambulatory Visit: Payer: Self-pay | Admitting: Internal Medicine

## 2013-05-29 ENCOUNTER — Encounter: Payer: Medicare Other | Admitting: Internal Medicine

## 2013-07-22 ENCOUNTER — Encounter: Payer: Self-pay | Admitting: Internal Medicine

## 2013-07-22 ENCOUNTER — Ambulatory Visit (INDEPENDENT_AMBULATORY_CARE_PROVIDER_SITE_OTHER): Payer: Medicare Other | Admitting: Internal Medicine

## 2013-07-22 VITALS — BP 130/70 | HR 79 | Temp 98.0°F | Ht 68.5 in | Wt 224.2 lb

## 2013-07-22 DIAGNOSIS — J45909 Unspecified asthma, uncomplicated: Secondary | ICD-10-CM | POA: Diagnosis not present

## 2013-07-22 DIAGNOSIS — E7800 Pure hypercholesterolemia, unspecified: Secondary | ICD-10-CM

## 2013-07-22 DIAGNOSIS — R7989 Other specified abnormal findings of blood chemistry: Secondary | ICD-10-CM

## 2013-07-22 DIAGNOSIS — K219 Gastro-esophageal reflux disease without esophagitis: Secondary | ICD-10-CM | POA: Diagnosis not present

## 2013-07-22 DIAGNOSIS — G4730 Sleep apnea, unspecified: Secondary | ICD-10-CM

## 2013-07-22 DIAGNOSIS — M810 Age-related osteoporosis without current pathological fracture: Secondary | ICD-10-CM

## 2013-07-22 DIAGNOSIS — R945 Abnormal results of liver function studies: Secondary | ICD-10-CM

## 2013-07-22 DIAGNOSIS — Z23 Encounter for immunization: Secondary | ICD-10-CM

## 2013-07-22 DIAGNOSIS — G2581 Restless legs syndrome: Secondary | ICD-10-CM

## 2013-07-22 NOTE — Progress Notes (Signed)
Pre-visit discussion using our clinic review tool. No additional management support is needed unless otherwise documented below in the visit note.  

## 2013-07-22 NOTE — Progress Notes (Signed)
Subjective:    Patient ID: Joseph Good., male    DOB: Sep 02, 1945, 68 y.o.   MRN: [id number]  HPI 68 year old male with past history of asthma/reactive airways disease, sleep apnea, restless leg syndrome and hypercholesterolemia who comes in today to follow up on these issues as well as for a complete physical exam.   He states he has been doing well.  The previous ulceration - healed.  He is eating and drinking well.  Trying to watch his diet now.  Just started.  Has lost some weight.   No sob.  No chest pain or tightness.  Has been seeing Dr Jacqlyn Larsen for elevated PSA.  Highest 8.6.  Treated with cipro - 6.1.  Biopsy negative.  Treated with finasteride - psa 4.3.  Overall he feels good.     Past Medical History  Diagnosis Date  . Asthma   . Hypercholesterolemia   . Bladder outlet obstruction   . GERD (gastroesophageal reflux disease)     schatzki ring  . Skin cancer, basal cell   . Crohn's disease     appendix, s/p appendectomy  . Restless leg syndrome   . Seizure disorder     age 87, previously on phenobarbital  . Sleep apnea     Outpatient Encounter Prescriptions as of 07/22/2013  Medication Sig  . ADVAIR DISKUS 100-50 MCG/DOSE AEPB INHALE 1 PUFF EVERY DAY  . albuterol (PROVENTIL) 2 MG tablet Take 2 mg by mouth as needed.  . calcium gluconate 500 MG tablet Take 500 mg by mouth 2 (two) times daily.  . Cholecalciferol (VITAMIN D-3) 1000 UNITS CAPS Take 1 capsule by mouth daily.  . finasteride (PROSCAR) 5 MG tablet Take 5 mg by mouth daily.  . Magnesium 250 MG TABS Two tablets q day  . omeprazole (PRILOSEC) 20 MG capsule Take 20 mg by mouth daily.  . pramipexole (MIRAPEX) 0.25 MG tablet TAKE 1 AND 1/2 TABLETS DAILY  . [DISCONTINUED] levofloxacin (LEVAQUIN) 500 MG tablet Take 1 tablet (500 mg total) by mouth daily. Take 1 tablet daily for 10 days.  . [DISCONTINUED] predniSONE (DELTASONE) 10 MG tablet Start with 60 mg (6 tablets) on the first day and taper by 10 mg (1 tablet) daily  until complete    Review of Systems Patient denies any headache, lightheadedness or dizziness.  No sinus or allergy symptoms.  No chest pain, tightness or palpitations.  No increased shortness of breath, cough or congestion.   No acid reflux.  No dysphagia.  No abdominal pain or cramping.  No nausea or vomiting.  No bowel change, such as constipation, BRBPR or melana.  No diarrhea.   No urine change.        Objective:   Physical Exam  Filed Vitals:   07/22/13 1408  BP: 130/70  Pulse: 79  Temp: 98 F (36.7 C)   Blood pressure recheck;  31/1  68 year old male in no acute distress.  HEENT:  Nares - clear.  Oropharynx - without lesions. NECK:  Supple.  Nontender.  No audible carotid bruit.  HEART:  Appears to be regular.   LUNGS:  No crackles or wheezing audible.  Respirations even and unlabored.   RADIAL PULSE:  Equal bilaterally.  ABDOMEN:  Soft.  Nontender.  Bowel sounds present and normal.  No audible abdominal bruit.    EXTREMITIES:  No increased edema present.  DP pulses palpable and equal bilaterally.         Assessment & Plan:  ABDOMINAL WALL HERNIA.  Repair by Dr Rogelia Boga.  Doing well.    CARDIOVASCULAR.  Asymptomatic.    HEALTH MAINTENANCE.  Physical today.  PSA and prostate checks - followed by Dr Jacqlyn Larsen.  Colonoscopy just performed 2013.

## 2013-07-25 ENCOUNTER — Encounter: Payer: Self-pay | Admitting: Internal Medicine

## 2013-07-25 NOTE — Assessment & Plan Note (Signed)
CPAP.  [place].

## 2013-07-25 NOTE — Assessment & Plan Note (Signed)
Takes calcium and vitamin D.  Desires no other medication.  Follow vitamin D level.    

## 2013-07-25 NOTE — Assessment & Plan Note (Signed)
Low cholesterol diet.  Desires not to take cholesterol medication.  Follow lipid panel.  Has recently adjusted his diet an dis losing weight.  Follow.

## 2013-07-25 NOTE — Assessment & Plan Note (Signed)
Follow liver panel to confirm stable.

## 2013-07-25 NOTE — Assessment & Plan Note (Signed)
Symptoms controlled.  Follow.  Ulceration healed.    

## 2013-07-25 NOTE — Assessment & Plan Note (Signed)
On Requip.  Doing well.  Follow.   

## 2013-07-25 NOTE — Assessment & Plan Note (Signed)
Doing well.  Continue inhalers.  Follow.   

## 2013-07-29 ENCOUNTER — Encounter: Payer: Medicare Other | Admitting: Internal Medicine

## 2013-09-16 DIAGNOSIS — D0439 Carcinoma in situ of skin of other parts of face: Secondary | ICD-10-CM | POA: Diagnosis not present

## 2013-09-16 DIAGNOSIS — D485 Neoplasm of uncertain behavior of skin: Secondary | ICD-10-CM | POA: Diagnosis not present

## 2013-09-16 DIAGNOSIS — L570 Actinic keratosis: Secondary | ICD-10-CM | POA: Diagnosis not present

## 2013-09-16 DIAGNOSIS — Z1283 Encounter for screening for malignant neoplasm of skin: Secondary | ICD-10-CM | POA: Diagnosis not present

## 2013-09-16 DIAGNOSIS — D0430 Carcinoma in situ of skin of unspecified part of face: Secondary | ICD-10-CM | POA: Diagnosis not present

## 2013-09-16 DIAGNOSIS — L821 Other seborrheic keratosis: Secondary | ICD-10-CM | POA: Diagnosis not present

## 2013-10-22 ENCOUNTER — Other Ambulatory Visit (INDEPENDENT_AMBULATORY_CARE_PROVIDER_SITE_OTHER): Payer: Medicare Other

## 2013-10-22 DIAGNOSIS — R945 Abnormal results of liver function studies: Secondary | ICD-10-CM

## 2013-10-22 DIAGNOSIS — M810 Age-related osteoporosis without current pathological fracture: Secondary | ICD-10-CM

## 2013-10-22 DIAGNOSIS — R7989 Other specified abnormal findings of blood chemistry: Secondary | ICD-10-CM | POA: Diagnosis not present

## 2013-10-22 DIAGNOSIS — E7800 Pure hypercholesterolemia, unspecified: Secondary | ICD-10-CM | POA: Diagnosis not present

## 2013-10-22 LAB — HEPATIC FUNCTION PANEL
ALT: 17 U/L (ref 0–53)
AST: 44 U/L — ABNORMAL HIGH (ref 0–37)
Albumin: 3.8 g/dL (ref 3.5–5.2)
Alkaline Phosphatase: 61 U/L (ref 39–117)
BILIRUBIN DIRECT: 0.1 mg/dL (ref 0.0–0.3)
Total Bilirubin: 0.7 mg/dL (ref 0.2–1.2)
Total Protein: 6.7 g/dL (ref 6.0–8.3)

## 2013-10-22 LAB — LIPID PANEL
CHOLESTEROL: 219 mg/dL — AB (ref 0–200)
HDL: 45.8 mg/dL (ref >39.00)
LDL Cholesterol: 151 mg/dL — ABNORMAL HIGH (ref 0–99)
NonHDL: 173.2
Total CHOL/HDL Ratio: 5
Triglycerides: 110 mg/dL (ref 0.0–149.0)
VLDL: 22 mg/dL (ref 0.0–40.0)

## 2013-10-23 LAB — VITAMIN D 25 HYDROXY (VIT D DEFICIENCY, FRACTURES): Vit D, 25-Hydroxy: 39 ng/mL (ref 30–89)

## 2013-10-24 ENCOUNTER — Encounter: Payer: Self-pay | Admitting: Internal Medicine

## 2013-11-16 ENCOUNTER — Other Ambulatory Visit: Payer: Self-pay | Admitting: Internal Medicine

## 2013-11-27 DIAGNOSIS — N411 Chronic prostatitis: Secondary | ICD-10-CM | POA: Diagnosis not present

## 2013-11-27 DIAGNOSIS — R972 Elevated prostate specific antigen [PSA]: Secondary | ICD-10-CM | POA: Diagnosis not present

## 2013-11-27 DIAGNOSIS — N400 Enlarged prostate without lower urinary tract symptoms: Secondary | ICD-10-CM | POA: Diagnosis not present

## 2013-11-27 DIAGNOSIS — N139 Obstructive and reflux uropathy, unspecified: Secondary | ICD-10-CM | POA: Diagnosis not present

## 2013-11-27 DIAGNOSIS — M545 Low back pain, unspecified: Secondary | ICD-10-CM | POA: Diagnosis not present

## 2013-11-27 DIAGNOSIS — N281 Cyst of kidney, acquired: Secondary | ICD-10-CM | POA: Diagnosis not present

## 2013-11-27 DIAGNOSIS — N401 Enlarged prostate with lower urinary tract symptoms: Secondary | ICD-10-CM | POA: Diagnosis not present

## 2013-11-27 DIAGNOSIS — R339 Retention of urine, unspecified: Secondary | ICD-10-CM | POA: Diagnosis not present

## 2013-11-27 DIAGNOSIS — G40909 Epilepsy, unspecified, not intractable, without status epilepticus: Secondary | ICD-10-CM | POA: Diagnosis not present

## 2013-11-29 DIAGNOSIS — R339 Retention of urine, unspecified: Secondary | ICD-10-CM | POA: Insufficient documentation

## 2013-12-01 DIAGNOSIS — Z85828 Personal history of other malignant neoplasm of skin: Secondary | ICD-10-CM | POA: Insufficient documentation

## 2013-12-01 DIAGNOSIS — C44320 Squamous cell carcinoma of skin of unspecified parts of face: Secondary | ICD-10-CM | POA: Diagnosis not present

## 2013-12-17 ENCOUNTER — Other Ambulatory Visit: Payer: Self-pay | Admitting: *Deleted

## 2013-12-17 MED ORDER — PRAVASTATIN SODIUM 10 MG PO TABS: 10.0000 mg | ORAL_TABLET | Freq: Every day | ORAL | Status: DC

## 2013-12-17 MED ORDER — FLUTICASONE-SALMETEROL 100-50 MCG/DOSE IN AEPB: INHALATION_SPRAY | RESPIRATORY_TRACT | Status: DC

## 2013-12-17 NOTE — Telephone Encounter (Signed)
Prescription sent in for pravastatin 10mg  #30 with one refill.  Pt agreed to try pravastatin.  See my chart message.

## 2013-12-22 ENCOUNTER — Other Ambulatory Visit: Payer: Self-pay | Admitting: *Deleted

## 2013-12-22 MED ORDER — PRAMIPEXOLE DIHYDROCHLORIDE 0.25 MG PO TABS: ORAL_TABLET | ORAL | Status: DC

## 2014-01-22 ENCOUNTER — Encounter: Payer: Self-pay | Admitting: Internal Medicine

## 2014-01-22 ENCOUNTER — Ambulatory Visit (INDEPENDENT_AMBULATORY_CARE_PROVIDER_SITE_OTHER): Payer: Medicare Other | Admitting: Internal Medicine

## 2014-01-22 VITALS — BP 110/64 | HR 59 | Temp 97.7°F | Resp 14 | Ht 68.5 in | Wt 219.8 lb

## 2014-01-22 DIAGNOSIS — Z23 Encounter for immunization: Secondary | ICD-10-CM | POA: Diagnosis not present

## 2014-01-22 DIAGNOSIS — E7800 Pure hypercholesterolemia, unspecified: Secondary | ICD-10-CM

## 2014-01-22 DIAGNOSIS — R945 Abnormal results of liver function studies: Secondary | ICD-10-CM

## 2014-01-22 DIAGNOSIS — M810 Age-related osteoporosis without current pathological fracture: Secondary | ICD-10-CM

## 2014-01-22 DIAGNOSIS — J45909 Unspecified asthma, uncomplicated: Secondary | ICD-10-CM

## 2014-01-22 DIAGNOSIS — G4730 Sleep apnea, unspecified: Secondary | ICD-10-CM

## 2014-01-22 DIAGNOSIS — G2581 Restless legs syndrome: Secondary | ICD-10-CM

## 2014-01-22 DIAGNOSIS — R7989 Other specified abnormal findings of blood chemistry: Secondary | ICD-10-CM | POA: Diagnosis not present

## 2014-01-22 DIAGNOSIS — K219 Gastro-esophageal reflux disease without esophagitis: Secondary | ICD-10-CM

## 2014-01-22 DIAGNOSIS — J4520 Mild intermittent asthma, uncomplicated: Secondary | ICD-10-CM

## 2014-01-22 LAB — HEPATIC FUNCTION PANEL
ALT: 21 U/L (ref 0–53)
AST: 58 U/L — ABNORMAL HIGH (ref 0–37)
Albumin: 3.9 g/dL (ref 3.5–5.2)
Alkaline Phosphatase: 66 U/L (ref 39–117)
Bilirubin, Direct: 0.1 mg/dL (ref 0.0–0.3)
Total Bilirubin: 0.6 mg/dL (ref 0.2–1.2)
Total Protein: 6.8 g/dL (ref 6.0–8.3)

## 2014-01-22 NOTE — Progress Notes (Signed)
Pre visit review using our clinic review tool, if applicable. No additional management support is needed unless otherwise documented below in the visit note. 

## 2014-01-24 ENCOUNTER — Other Ambulatory Visit: Payer: Self-pay | Admitting: Internal Medicine

## 2014-01-24 ENCOUNTER — Encounter: Payer: Self-pay | Admitting: Internal Medicine

## 2014-01-24 DIAGNOSIS — R945 Abnormal results of liver function studies: Secondary | ICD-10-CM

## 2014-01-24 DIAGNOSIS — R7989 Other specified abnormal findings of blood chemistry: Secondary | ICD-10-CM

## 2014-01-24 NOTE — Assessment & Plan Note (Signed)
Takes calcium and vitamin D.  Desires no other medication.  Follow vitamin D level.

## 2014-01-24 NOTE — Assessment & Plan Note (Signed)
Doing well.  Continue inhalers.  Follow.

## 2014-01-24 NOTE — Progress Notes (Signed)
Subjective:    Patient ID: Joseph Good., male    DOB: 1945/07/24, 68 y.o.   MRN: [id number]  HPI 68 year old male with past history of asthma/reactive airways disease, sleep apnea, restless leg syndrome and hypercholesterolemia who comes in today for a scheduled follow up.  He states he has been doing well.  The previous ulceration - healed.  He is eating and drinking well.  Trying to watch his diet better.  No sob.  No chest pain or tightness.  Has been seeing Dr Jacqlyn Larsen for elevated PSA.  Highest 8.6.  Treated with cipro - 6.1.  Biopsy negative.  Treated with finasteride - psa decreased.  Overall he feels good.  On pravastatin.  Tolerating.     Past Medical History  Diagnosis Date  . Asthma   . Hypercholesterolemia   . Bladder outlet obstruction   . GERD (gastroesophageal reflux disease)     schatzki ring  . Skin cancer, basal cell   . Crohn's disease     appendix, s/p appendectomy  . Restless leg syndrome   . Seizure disorder     age 52, previously on phenobarbital  . Sleep apnea     Outpatient Encounter Prescriptions as of 01/22/2014  Medication Sig  . albuterol (PROVENTIL) 2 MG tablet Take 2 mg by mouth as needed.  . calcium gluconate 500 MG tablet Take 500 mg by mouth 2 (two) times daily.  . Cholecalciferol (VITAMIN D-3) 1000 UNITS CAPS Take 1 capsule by mouth daily.  . finasteride (PROSCAR) 5 MG tablet Take 5 mg by mouth daily.  . Fluticasone-Salmeterol (ADVAIR DISKUS) 100-50 MCG/DOSE AEPB INHALE 1 PUFF EVERY DAY  . Magnesium 250 MG TABS Two tablets q day  . omeprazole (PRILOSEC) 20 MG capsule Take 20 mg by mouth daily.  . pramipexole (MIRAPEX) 0.25 MG tablet TAKE 1 AND 1/2 TABLETS DAILY  . pravastatin (PRAVACHOL) 10 MG tablet Take 1 tablet (10 mg total) by mouth daily.    Review of Systems Patient denies any headache, lightheadedness or dizziness.  No sinus or allergy symptoms.  No chest pain, tightness or palpitations.  No increased shortness of breath, cough or  congestion.   No acid reflux.  No dysphagia.  No abdominal pain or cramping.  No nausea or vomiting.  No bowel change, such as constipation, BRBPR or melana.  No diarrhea.   No urine change.  Tolerating pravastatin.       Objective:   Physical Exam  Filed Vitals:   01/22/14 1405  BP: 110/64  Pulse: 59  Temp: 97.7 F (36.5 C)  Resp: 14   Blood pressure recheck;  43/62  68 year old male in no acute distress.  HEENT:  Nares - clear.  Oropharynx - without lesions. NECK:  Supple.  Nontender.  No audible carotid bruit.  HEART:  Appears to be regular.   LUNGS:  No crackles or wheezing audible.  Respirations even and unlabored.   RADIAL PULSE:  Equal bilaterally.  ABDOMEN:  Soft.  Nontender.  Bowel sounds present and normal.  No audible abdominal bruit.    EXTREMITIES:  No increased edema present.  DP pulses palpable and equal bilaterally.         Assessment & Plan:  ABDOMINAL WALL HERNIA.  Repair by Dr Rogelia Boga.  Doing well.    CARDIOVASCULAR.  Asymptomatic.    HEALTH MAINTENANCE.  Physical 07/22/13.  PSA and prostate checks - followed by Dr Jacqlyn Larsen.  PSA down.   Colonoscopy -  2013.   

## 2014-01-24 NOTE — Assessment & Plan Note (Signed)
On Requip.  Doing well.  Follow.

## 2014-01-24 NOTE — Assessment & Plan Note (Signed)
Using CPAP.  [place].

## 2014-01-24 NOTE — Assessment & Plan Note (Signed)
Low cholesterol diet.  On pravastatin.  Tolerating.  Follow lipid panel.  Check liver panel today.

## 2014-01-24 NOTE — Progress Notes (Signed)
F/u liver panel ordered.  

## 2014-01-24 NOTE — Assessment & Plan Note (Signed)
Symptoms controlled.  Follow.  Ulceration healed.

## 2014-01-24 NOTE — Assessment & Plan Note (Addendum)
Follow liver panel to confirm stable.  Recheck today since starting pravastatin.

## 2014-01-25 ENCOUNTER — Encounter: Payer: Self-pay | Admitting: *Deleted

## 2014-01-27 DIAGNOSIS — H2510 Age-related nuclear cataract, unspecified eye: Secondary | ICD-10-CM | POA: Diagnosis not present

## 2014-01-27 DIAGNOSIS — Z961 Presence of intraocular lens: Secondary | ICD-10-CM | POA: Diagnosis not present

## 2014-02-15 ENCOUNTER — Other Ambulatory Visit: Payer: Self-pay | Admitting: Internal Medicine

## 2014-02-22 ENCOUNTER — Other Ambulatory Visit: Payer: Federal, State, Local not specified - PPO

## 2014-02-23 ENCOUNTER — Other Ambulatory Visit (INDEPENDENT_AMBULATORY_CARE_PROVIDER_SITE_OTHER): Payer: Medicare Other

## 2014-02-23 DIAGNOSIS — R7989 Other specified abnormal findings of blood chemistry: Secondary | ICD-10-CM

## 2014-02-23 DIAGNOSIS — R945 Abnormal results of liver function studies: Secondary | ICD-10-CM

## 2014-02-24 ENCOUNTER — Encounter: Payer: Self-pay | Admitting: Internal Medicine

## 2014-02-24 LAB — HEPATIC FUNCTION PANEL
ALT: 23 U/L (ref 0–53)
AST: 59 U/L — ABNORMAL HIGH (ref 0–37)
Albumin: 3.3 g/dL — ABNORMAL LOW (ref 3.5–5.2)
Alkaline Phosphatase: 65 U/L (ref 39–117)
BILIRUBIN DIRECT: 0.1 mg/dL (ref 0.0–0.3)
BILIRUBIN TOTAL: 0.8 mg/dL (ref 0.2–1.2)
Total Protein: 6.7 g/dL (ref 6.0–8.3)

## 2014-04-07 ENCOUNTER — Telehealth: Payer: Self-pay | Admitting: *Deleted

## 2014-04-07 DIAGNOSIS — R945 Abnormal results of liver function studies: Secondary | ICD-10-CM

## 2014-04-07 DIAGNOSIS — E7800 Pure hypercholesterolemia, unspecified: Secondary | ICD-10-CM

## 2014-04-07 DIAGNOSIS — R7989 Other specified abnormal findings of blood chemistry: Secondary | ICD-10-CM

## 2014-04-07 NOTE — Telephone Encounter (Signed)
Pt is coming tomorrow what labs and dx?  

## 2014-04-08 ENCOUNTER — Other Ambulatory Visit (INDEPENDENT_AMBULATORY_CARE_PROVIDER_SITE_OTHER): Payer: Medicare Other

## 2014-04-08 DIAGNOSIS — R945 Abnormal results of liver function studies: Secondary | ICD-10-CM

## 2014-04-08 DIAGNOSIS — D0439 Carcinoma in situ of skin of other parts of face: Secondary | ICD-10-CM | POA: Diagnosis not present

## 2014-04-08 DIAGNOSIS — E7800 Pure hypercholesterolemia, unspecified: Secondary | ICD-10-CM

## 2014-04-08 DIAGNOSIS — R7989 Other specified abnormal findings of blood chemistry: Secondary | ICD-10-CM | POA: Diagnosis not present

## 2014-04-08 LAB — COMPREHENSIVE METABOLIC PANEL
ALK PHOS: 65 U/L (ref 39–117)
ALT: 25 U/L (ref 0–53)
AST: 58 U/L — AB (ref 0–37)
Albumin: 3.8 g/dL (ref 3.5–5.2)
BUN: 15 mg/dL (ref 6–23)
CO2: 30 mEq/L (ref 19–32)
CREATININE: 1 mg/dL (ref 0.4–1.5)
Calcium: 8.9 mg/dL (ref 8.4–10.5)
Chloride: 101 mEq/L (ref 96–112)
GFR: 77.37 mL/min (ref >60.00)
Glucose, Bld: 85 mg/dL (ref 70–99)
POTASSIUM: 4.5 meq/L (ref 3.5–5.1)
Sodium: 136 mEq/L (ref 135–145)
Total Bilirubin: 1 mg/dL (ref 0.2–1.2)
Total Protein: 6.5 g/dL (ref 6.0–8.3)

## 2014-04-08 LAB — LIPID PANEL
CHOL/HDL RATIO: 4
Cholesterol: 209 mg/dL — ABNORMAL HIGH (ref 0–200)
HDL: 48.4 mg/dL (ref >39.00)
LDL CALC: 143 mg/dL — AB (ref 0–99)
NONHDL: 160.6
TRIGLYCERIDES: 90 mg/dL (ref 0.0–149.0)
VLDL: 18 mg/dL (ref 0.0–40.0)

## 2014-04-08 NOTE — Telephone Encounter (Signed)
Order placed for labs.

## 2014-04-09 ENCOUNTER — Telehealth: Payer: Self-pay | Admitting: Internal Medicine

## 2014-04-09 ENCOUNTER — Encounter: Payer: Self-pay | Admitting: Internal Medicine

## 2014-04-09 DIAGNOSIS — E7800 Pure hypercholesterolemia, unspecified: Secondary | ICD-10-CM

## 2014-04-09 DIAGNOSIS — R945 Abnormal results of liver function studies: Secondary | ICD-10-CM

## 2014-04-09 DIAGNOSIS — R7989 Other specified abnormal findings of blood chemistry: Secondary | ICD-10-CM

## 2014-04-09 NOTE — Telephone Encounter (Signed)
Pt notified of lab results via my chart.  Needs a non fasting lab appointment in 2 months.  Please schedule and contact him with an appt date and time.  Thanks.    Dr Nicki Reaper

## 2014-05-12 DIAGNOSIS — Z86010 Personal history of colonic polyps: Secondary | ICD-10-CM | POA: Insufficient documentation

## 2014-05-12 DIAGNOSIS — Z8709 Personal history of other diseases of the respiratory system: Secondary | ICD-10-CM | POA: Insufficient documentation

## 2014-05-12 DIAGNOSIS — Z8719 Personal history of other diseases of the digestive system: Secondary | ICD-10-CM | POA: Insufficient documentation

## 2014-05-12 DIAGNOSIS — E7800 Pure hypercholesterolemia, unspecified: Secondary | ICD-10-CM | POA: Insufficient documentation

## 2014-05-13 DIAGNOSIS — R05 Cough: Secondary | ICD-10-CM | POA: Diagnosis not present

## 2014-05-13 DIAGNOSIS — R945 Abnormal results of liver function studies: Secondary | ICD-10-CM | POA: Diagnosis not present

## 2014-05-13 DIAGNOSIS — Z8719 Personal history of other diseases of the digestive system: Secondary | ICD-10-CM | POA: Diagnosis not present

## 2014-05-14 MED ORDER — FLUTICASONE-SALMETEROL 100-50 MCG/DOSE IN AEPB: INHALATION_SPRAY | RESPIRATORY_TRACT | Status: DC

## 2014-06-10 ENCOUNTER — Other Ambulatory Visit (INDEPENDENT_AMBULATORY_CARE_PROVIDER_SITE_OTHER): Payer: Medicare Other

## 2014-06-10 DIAGNOSIS — R945 Abnormal results of liver function studies: Secondary | ICD-10-CM

## 2014-06-10 DIAGNOSIS — R7989 Other specified abnormal findings of blood chemistry: Secondary | ICD-10-CM | POA: Diagnosis not present

## 2014-06-10 DIAGNOSIS — E7800 Pure hypercholesterolemia, unspecified: Secondary | ICD-10-CM

## 2014-06-10 LAB — HEPATIC FUNCTION PANEL
ALT: 28 U/L (ref 0–53)
AST: 63 U/L — ABNORMAL HIGH (ref 0–37)
Albumin: 3.9 g/dL (ref 3.5–5.2)
Alkaline Phosphatase: 66 U/L (ref 39–117)
BILIRUBIN DIRECT: 0.1 mg/dL (ref 0.0–0.3)
TOTAL PROTEIN: 7.2 g/dL (ref 6.0–8.3)
Total Bilirubin: 0.8 mg/dL (ref 0.2–1.2)

## 2014-06-11 ENCOUNTER — Telehealth: Payer: Self-pay | Admitting: Internal Medicine

## 2014-06-11 ENCOUNTER — Encounter: Payer: Self-pay | Admitting: Internal Medicine

## 2014-06-11 DIAGNOSIS — R7989 Other specified abnormal findings of blood chemistry: Secondary | ICD-10-CM

## 2014-06-11 DIAGNOSIS — G4730 Sleep apnea, unspecified: Secondary | ICD-10-CM

## 2014-06-11 DIAGNOSIS — R945 Abnormal results of liver function studies: Secondary | ICD-10-CM

## 2014-06-11 DIAGNOSIS — E7800 Pure hypercholesterolemia, unspecified: Secondary | ICD-10-CM

## 2014-06-11 NOTE — Telephone Encounter (Signed)
Pt notified of lab results via my chart.  Needs fasting labs 1-2 days before 07/29/14 appt.  Please schedule and contact pt with a lab appt date and time.   Thanks.

## 2014-07-02 DIAGNOSIS — R05 Cough: Secondary | ICD-10-CM | POA: Diagnosis not present

## 2014-07-02 DIAGNOSIS — K319 Disease of stomach and duodenum, unspecified: Secondary | ICD-10-CM | POA: Diagnosis not present

## 2014-07-02 DIAGNOSIS — K2210 Ulcer of esophagus without bleeding: Secondary | ICD-10-CM | POA: Diagnosis not present

## 2014-07-02 DIAGNOSIS — K229 Disease of esophagus, unspecified: Secondary | ICD-10-CM | POA: Diagnosis not present

## 2014-07-02 DIAGNOSIS — K317 Polyp of stomach and duodenum: Secondary | ICD-10-CM | POA: Diagnosis not present

## 2014-07-02 DIAGNOSIS — K222 Esophageal obstruction: Secondary | ICD-10-CM | POA: Diagnosis not present

## 2014-07-26 ENCOUNTER — Other Ambulatory Visit (INDEPENDENT_AMBULATORY_CARE_PROVIDER_SITE_OTHER): Payer: Medicare Other

## 2014-07-26 ENCOUNTER — Encounter: Payer: Self-pay | Admitting: Internal Medicine

## 2014-07-26 DIAGNOSIS — R7989 Other specified abnormal findings of blood chemistry: Secondary | ICD-10-CM

## 2014-07-26 DIAGNOSIS — E7800 Pure hypercholesterolemia, unspecified: Secondary | ICD-10-CM

## 2014-07-26 DIAGNOSIS — G4730 Sleep apnea, unspecified: Secondary | ICD-10-CM

## 2014-07-26 DIAGNOSIS — R945 Abnormal results of liver function studies: Secondary | ICD-10-CM

## 2014-07-26 LAB — LIPID PANEL
Cholesterol: 197 mg/dL (ref 0–200)
HDL: 53.5 mg/dL (ref >39.00)
LDL CALC: 123 mg/dL — AB (ref 0–99)
NonHDL: 143.5
Total CHOL/HDL Ratio: 4
Triglycerides: 105 mg/dL (ref 0.0–149.0)
VLDL: 21 mg/dL (ref 0.0–40.0)

## 2014-07-26 LAB — BASIC METABOLIC PANEL
BUN: 13 mg/dL (ref 6–23)
CO2: 30 mEq/L (ref 19–32)
Calcium: 9.5 mg/dL (ref 8.4–10.5)
Chloride: 100 mEq/L (ref 96–112)
Creatinine, Ser: 1.11 mg/dL (ref 0.40–1.50)
GFR: 70.12 mL/min (ref >60.00)
GLUCOSE: 99 mg/dL (ref 70–99)
POTASSIUM: 4.4 meq/L (ref 3.5–5.1)
Sodium: 136 mEq/L (ref 135–145)

## 2014-07-26 LAB — HEPATIC FUNCTION PANEL
ALBUMIN: 4 g/dL (ref 3.5–5.2)
ALK PHOS: 68 U/L (ref 39–117)
ALT: 24 U/L (ref 0–53)
AST: 53 U/L — AB (ref 0–37)
BILIRUBIN TOTAL: 0.7 mg/dL (ref 0.2–1.2)
Bilirubin, Direct: 0.1 mg/dL (ref 0.0–0.3)
Total Protein: 7.3 g/dL (ref 6.0–8.3)

## 2014-07-28 DIAGNOSIS — N401 Enlarged prostate with lower urinary tract symptoms: Secondary | ICD-10-CM | POA: Diagnosis not present

## 2014-07-28 DIAGNOSIS — R972 Elevated prostate specific antigen [PSA]: Secondary | ICD-10-CM | POA: Diagnosis not present

## 2014-07-28 DIAGNOSIS — R339 Retention of urine, unspecified: Secondary | ICD-10-CM | POA: Diagnosis not present

## 2014-07-28 DIAGNOSIS — N411 Chronic prostatitis: Secondary | ICD-10-CM | POA: Diagnosis not present

## 2014-07-28 DIAGNOSIS — N400 Enlarged prostate without lower urinary tract symptoms: Secondary | ICD-10-CM | POA: Diagnosis not present

## 2014-07-29 ENCOUNTER — Ambulatory Visit (INDEPENDENT_AMBULATORY_CARE_PROVIDER_SITE_OTHER): Payer: Medicare Other | Admitting: Internal Medicine

## 2014-07-29 ENCOUNTER — Encounter: Payer: Self-pay | Admitting: Internal Medicine

## 2014-07-29 VITALS — BP 124/72 | HR 73 | Temp 98.3°F | Resp 12 | Ht 68.5 in | Wt 229.8 lb

## 2014-07-29 DIAGNOSIS — M810 Age-related osteoporosis without current pathological fracture: Secondary | ICD-10-CM | POA: Diagnosis not present

## 2014-07-29 DIAGNOSIS — R7989 Other specified abnormal findings of blood chemistry: Secondary | ICD-10-CM

## 2014-07-29 DIAGNOSIS — K219 Gastro-esophageal reflux disease without esophagitis: Secondary | ICD-10-CM | POA: Diagnosis not present

## 2014-07-29 DIAGNOSIS — G4730 Sleep apnea, unspecified: Secondary | ICD-10-CM

## 2014-07-29 DIAGNOSIS — Z0000 Encounter for general adult medical examination without abnormal findings: Secondary | ICD-10-CM

## 2014-07-29 DIAGNOSIS — E7800 Pure hypercholesterolemia, unspecified: Secondary | ICD-10-CM

## 2014-07-29 DIAGNOSIS — R945 Abnormal results of liver function studies: Secondary | ICD-10-CM

## 2014-07-29 DIAGNOSIS — J4520 Mild intermittent asthma, uncomplicated: Secondary | ICD-10-CM

## 2014-07-29 DIAGNOSIS — Z1211 Encounter for screening for malignant neoplasm of colon: Secondary | ICD-10-CM

## 2014-07-29 NOTE — Progress Notes (Signed)
Pre visit review using our clinic review tool, if applicable. No additional management support is needed unless otherwise documented below in the visit note. 

## 2014-08-01 ENCOUNTER — Encounter: Payer: Self-pay | Admitting: Internal Medicine

## 2014-08-01 DIAGNOSIS — Z0000 Encounter for general adult medical examination without abnormal findings: Secondary | ICD-10-CM | POA: Insufficient documentation

## 2014-08-01 NOTE — Progress Notes (Signed)
Patient ID: Joseph Good., male   DOB: 25-Jun-1945, 69 y.o.   MRN: [id number]   Subjective:    Patient ID: Joseph Good., male    DOB: [birth year], 69 y.o.   MRN: [id number]  HPI  Patient here for his scheduled physical exam with me.  Just saw Dr Jacqlyn Larsen yesterday.  He follows his prostate and psa's.  States he is doing well.  They have moved.  Still trying to go through things at their other house.  Some increased work and stress related to this.  He feels he is doing well.  No cardiac symptoms with increased activity or exertion.  Breathing stable.  Discussed diet and exercise.  Bowels stable.  No blood.  Using CPAP.    Past Medical History  Diagnosis Date  . Asthma   . Hypercholesterolemia   . Bladder outlet obstruction   . GERD (gastroesophageal reflux disease)     schatzki ring  . Skin cancer, basal cell   . Crohn's disease     appendix, s/p appendectomy  . Restless leg syndrome   . Seizure disorder     age 45, previously on phenobarbital  . Sleep apnea     Outpatient Encounter Prescriptions as of 07/29/2014  Medication Sig  . albuterol (PROVENTIL HFA;VENTOLIN HFA) 108 (90 BASE) MCG/ACT inhaler Inhale 2 puffs into the lungs every 6 (six) hours as needed for wheezing or shortness of breath.  . calcium gluconate 500 MG tablet Take 500 mg by mouth 2 (two) times daily.  . Cholecalciferol (VITAMIN D-3) 1000 UNITS CAPS Take 1 capsule by mouth daily.  . finasteride (PROSCAR) 5 MG tablet Take 5 mg by mouth daily.  . Fluticasone-Salmeterol (ADVAIR DISKUS) 100-50 MCG/DOSE AEPB INHALE 1 PUFF EVERY DAY  . Magnesium 250 MG TABS Two tablets q day  . omeprazole (PRILOSEC) 20 MG capsule Take 20 mg by mouth daily.  . pramipexole (MIRAPEX) 0.25 MG tablet TAKE 1 AND 1/2 TABLETS DAILY  . pravastatin (PRAVACHOL) 10 MG tablet TAKE 1 TABLET (10 MG TOTAL) BY MOUTH DAILY.  Marland Kitchen Glucosamine & Fish Oil 500-400-60-40 MG CAPS Take by mouth.  . [DISCONTINUED] albuterol (PROVENTIL) 2 MG tablet Take 2 mg  by mouth as needed.    Review of Systems  Constitutional: Negative for appetite change and unexpected weight change.  HENT: Negative for congestion and sinus pressure.   Eyes: Negative for pain and visual disturbance.  Respiratory: Negative for cough, chest tightness and shortness of breath.   Cardiovascular: Negative for chest pain, palpitations and leg swelling.  Gastrointestinal: Negative for nausea, vomiting, abdominal pain and diarrhea.  Genitourinary: Negative for dysuria and difficulty urinating.  Musculoskeletal: Negative for back pain and joint swelling.  Skin: Negative for color change and rash.  Neurological: Negative for dizziness, light-headedness and headaches.  Hematological: Negative for adenopathy. Does not bruise/bleed easily.  Psychiatric/Behavioral: Negative for dysphoric mood and agitation.       Objective:     Blood pressure recheck:  120/72  Physical Exam  Constitutional: He is oriented to person, place, and time. He appears well-developed and well-nourished. No distress.  HENT:  Head: Normocephalic and atraumatic.  Nose: Nose normal.  Mouth/Throat: Oropharynx is clear and moist. No oropharyngeal exudate.  Eyes: Conjunctivae are normal. Right eye exhibits no discharge. Left eye exhibits no discharge.  Neck: Neck supple. No thyromegaly present.  Cardiovascular: Normal rate and regular rhythm.   Pulmonary/Chest: Breath sounds normal. No respiratory distress. He has no wheezes.  Abdominal: Soft.  Bowel sounds are normal. There is no tenderness.  Musculoskeletal: He exhibits no edema or tenderness.  Lymphadenopathy:    He has no cervical adenopathy.  Neurological: He is alert and oriented to person, place, and time.  Skin: Skin is warm and dry. No rash noted.  Psychiatric: He has a normal mood and affect. His behavior is normal.    BP 124/72 mmHg  Pulse 73  Temp(Src) 98.3 F (36.8 C) (Oral)  Resp 12  Ht 5' 8.5" (1.74 m)  Wt 229 lb 12.8 oz (104.237 kg)   BMI 34.43 kg/m2  SpO2 97% Wt Readings from Last 3 Encounters:  07/29/14 229 lb 12.8 oz (104.237 kg)  01/22/14 219 lb 12 oz (99.678 kg)  07/22/13 224 lb 4 oz (101.719 kg)     Lab Results  Component Value Date   WBC 4.8 05/22/2013   HGB 15.7 05/22/2013   HCT 45.5 05/22/2013   PLT 230.0 05/22/2013   GLUCOSE 99 07/26/2014   CHOL 197 07/26/2014   TRIG 105.0 07/26/2014   HDL 53.50 07/26/2014   LDLDIRECT 154.7 05/22/2013   LDLCALC 123* 07/26/2014   ALT 24 07/26/2014   AST 53* 07/26/2014   NA 136 07/26/2014   K 4.4 07/26/2014   CL 100 07/26/2014   CREATININE 1.11 07/26/2014   BUN 13 07/26/2014   CO2 30 07/26/2014   TSH 1.26 05/22/2013       Assessment & Plan:   Problem List Items Addressed This Visit    Abnormal liver function test    Liver panel just checked - stable.  Follow.        Asthma    Breathing doing well.  Follow.       Relevant Medications   albuterol (PROVENTIL HFA;VENTOLIN HFA) 108 (90 BASE) MCG/ACT inhaler   GERD (gastroesophageal reflux disease)    Just evaluated by GI.  Had EGD 07/02/14 - hiatal hernia with multiple gastric polyps.  No evidence of dysplasia or Barrett's on pathology.  Symptoms controlled.  On prilosec.  Needs to continue.  Has return of symptoms even if he misses a dose.  Follow.       Relevant Orders   CBC with Differential/Platelet   Health care maintenance    Physical here today.  Just saw Dr Jacqlyn Larsen yesterday.  He follows prostate and PSA checks.  Colonoscopy 2013.  IFOB today.       Hypercholesterolemia    On pravastatin.  Follow lipid panel and liver function tests.  Low cholesterol diet and exercise.  LDL just checked and improved - 123.  Follow.        Relevant Orders   Comprehensive metabolic panel   Lipid panel   Osteoporosis    Continue calcium and vitamin D and weight bearing exercise.  Desires no other medication.  Follow.        Relevant Orders   Vit D  25 hydroxy (rtn osteoporosis monitoring)   Sleep apnea     Using CPAP.  Follow.       Relevant Orders   TSH    Other Visit Diagnoses    Colon cancer screening    -  Primary    Relevant Orders    Fecal occult blood, imunochemical       Einar Pheasant, MD

## 2014-08-01 NOTE — Assessment & Plan Note (Signed)
Physical here today.  Just saw Dr Jacqlyn Larsen yesterday.  He follows prostate and PSA checks.  Colonoscopy 2013.  IFOB today.

## 2014-08-01 NOTE — Assessment & Plan Note (Signed)
Liver panel just checked - stable.  Follow.

## 2014-08-01 NOTE — Assessment & Plan Note (Signed)
On pravastatin.  Follow lipid panel and liver function tests.  Low cholesterol diet and exercise.  LDL just checked and improved - 123.  Follow.

## 2014-08-01 NOTE — Assessment & Plan Note (Signed)
Just evaluated by GI.  Had EGD 07/02/14 - hiatal hernia with multiple gastric polyps.  No evidence of dysplasia or Barrett's on pathology.  Symptoms controlled.  On prilosec.  Needs to continue.  Has return of symptoms even if he misses a dose.  Follow.

## 2014-08-01 NOTE — Assessment & Plan Note (Signed)
Continue calcium and vitamin D and weight bearing exercise.  Desires no other medication.  Follow.

## 2014-08-01 NOTE — Assessment & Plan Note (Signed)
Breathing doing well.  Follow.   

## 2014-08-01 NOTE — Assessment & Plan Note (Signed)
Using CPAP.  Follow.

## 2014-08-06 ENCOUNTER — Other Ambulatory Visit (INDEPENDENT_AMBULATORY_CARE_PROVIDER_SITE_OTHER): Payer: Medicare Other

## 2014-08-06 DIAGNOSIS — Z1211 Encounter for screening for malignant neoplasm of colon: Secondary | ICD-10-CM | POA: Diagnosis not present

## 2014-08-06 LAB — FECAL OCCULT BLOOD, IMMUNOCHEMICAL: Fecal Occult Bld: NEGATIVE

## 2014-08-07 ENCOUNTER — Encounter: Payer: Self-pay | Admitting: Internal Medicine

## 2014-08-09 NOTE — Telephone Encounter (Signed)
unread mychart message mailed to patient

## 2014-08-16 ENCOUNTER — Ambulatory Visit: Payer: Federal, State, Local not specified - PPO | Admitting: Internal Medicine

## 2014-08-16 ENCOUNTER — Encounter: Payer: Self-pay | Admitting: Internal Medicine

## 2014-08-16 ENCOUNTER — Ambulatory Visit (INDEPENDENT_AMBULATORY_CARE_PROVIDER_SITE_OTHER): Payer: Medicare Other | Admitting: Internal Medicine

## 2014-08-16 VITALS — BP 131/69 | HR 78 | Temp 99.2°F | Ht 68.5 in | Wt 230.2 lb

## 2014-08-16 DIAGNOSIS — J069 Acute upper respiratory infection, unspecified: Secondary | ICD-10-CM | POA: Diagnosis not present

## 2014-08-16 MED ORDER — CEFDINIR 300 MG PO CAPS: 300.0000 mg | ORAL_CAPSULE | Freq: Two times a day (BID) | ORAL | Status: DC

## 2014-08-16 MED ORDER — ALBUTEROL SULFATE HFA 108 (90 BASE) MCG/ACT IN AERS
2.0000 | INHALATION_SPRAY | Freq: Four times a day (QID) | RESPIRATORY_TRACT | Status: DC | PRN
Start: 2014-08-16 — End: 2018-03-11

## 2014-08-16 MED ORDER — LEVOFLOXACIN 0.5 % OP SOLN: 1.0000 [drp] | Freq: Four times a day (QID) | OPHTHALMIC | Status: DC

## 2014-08-16 NOTE — Progress Notes (Signed)
Patient ID: Joseph Glimpse., male   DOB: 1945-09-05, 69 y.o.   MRN: [id number]   Subjective:    Patient ID: Joseph Glimpse., male    DOB: 11/21/1945, 69 y.o.   MRN: [id number]  HPI  Patient here as a work in with concerns regarding sore throat, chest congestion and productive cough.  States was working outside this week.  Dust flying.  Developed sore throat several days ago.  Fever - tmax 100.8.  Symptoms have been gradually worsening.  Increased nasal congestion and drainage.  Increased chest congestion and cough - productive of colored mucus.  Eyes draining.  Mucus.  Taking tussin.  No vomiting or diarrhea.  Has been using abx eye drops and steroid eye drops.  Wife reports some wheezing.     Past Medical History  Diagnosis Date  . Asthma   . Hypercholesterolemia   . Bladder outlet obstruction   . GERD (gastroesophageal reflux disease)     schatzki ring  . Skin cancer, basal cell   . Crohn's disease     appendix, s/p appendectomy  . Restless leg syndrome   . Seizure disorder     age 84, previously on phenobarbital  . Sleep apnea     Current Outpatient Prescriptions on File Prior to Visit  Medication Sig Dispense Refill  . calcium gluconate 500 MG tablet Take 500 mg by mouth 2 (two) times daily.    . Cholecalciferol (VITAMIN D-3) 1000 UNITS CAPS Take 1 capsule by mouth daily.    . finasteride (PROSCAR) 5 MG tablet Take 5 mg by mouth daily.    . Fluticasone-Salmeterol (ADVAIR DISKUS) 100-50 MCG/DOSE AEPB INHALE 1 PUFF EVERY DAY 180 each 3  . Glucosamine & Fish Oil 500-400-60-40 MG CAPS Take by mouth.    . Magnesium 250 MG TABS Two tablets q day    . omeprazole (PRILOSEC) 20 MG capsule Take 20 mg by mouth daily.    . pramipexole (MIRAPEX) 0.25 MG tablet TAKE 1 AND 1/2 TABLETS DAILY 135 tablet 2  . pravastatin (PRAVACHOL) 10 MG tablet TAKE 1 TABLET (10 MG TOTAL) BY MOUTH DAILY. 30 tablet 5   No current facility-administered medications on file prior to visit.    Review of  Systems  Constitutional: Negative for appetite change and unexpected weight change.  HENT: Positive for congestion, postnasal drip, sinus pressure and sore throat.   Respiratory: Positive for cough (productive of colored mucus. ) and wheezing. Negative for chest tightness and shortness of breath.   Cardiovascular: Negative for chest pain, palpitations and leg swelling.  Gastrointestinal: Negative for nausea, vomiting and diarrhea.  Neurological: Negative for dizziness and headaches.       Objective:    Physical Exam  Constitutional: He appears well-developed and well-nourished. No distress.  HENT:  Mouth/Throat: Oropharynx is clear and moist.  Nares - slightly erythematous turbinates.   Neck: Neck supple.  Cardiovascular: Normal rate and regular rhythm.   Pulmonary/Chest: Effort normal and breath sounds normal. No respiratory distress. He has no wheezes.  Abdominal: Soft. Bowel sounds are normal.  Lymphadenopathy:    He has no cervical adenopathy.  Psychiatric: He has a normal mood and affect. His behavior is normal.    BP 131/69 mmHg  Pulse 78  Temp(Src) 99.2 F (37.3 C) (Oral)  Ht 5' 8.5" (1.74 m)  Wt 230 lb 4 oz (104.441 kg)  BMI 34.50 kg/m2  SpO2 96% Wt Readings from Last 3 Encounters:  08/16/14 230 lb 4 oz (104.441  kg)  07/29/14 229 lb 12.8 oz (104.237 kg)  01/22/14 219 lb 12 oz (99.678 kg)     Lab Results  Component Value Date   WBC 4.8 05/22/2013   HGB 15.7 05/22/2013   HCT 45.5 05/22/2013   PLT 230.0 05/22/2013   GLUCOSE 99 07/26/2014   CHOL 197 07/26/2014   TRIG 105.0 07/26/2014   HDL 53.50 07/26/2014   LDLDIRECT 154.7 05/22/2013   LDLCALC 123* 07/26/2014   ALT 24 07/26/2014   AST 53* 07/26/2014   NA 136 07/26/2014   K 4.4 07/26/2014   CL 100 07/26/2014   CREATININE 1.11 07/26/2014   BUN 13 07/26/2014   CO2 30 07/26/2014   TSH 1.26 05/22/2013       Assessment & Plan:   Problem List Items Addressed This Visit    URI (upper respiratory  infection) - Primary    With increased congestion and productive cough.  Fever.  Do not feel cxr warranted.  Omnicef as directed.  Saline nasal spray and nasacort as directed.  Mucinex in the am and robitussin in the evening.  Rest.  Fluids.  Explained to him if symptoms changed, worsened or did not resolve - needs to be reevaluated.        Relevant Medications   cefdinir (OMNICEF) 300 MG capsule       Einar Pheasant, Joseph Good

## 2014-08-16 NOTE — Progress Notes (Signed)
Pre visit review using our clinic review tool, if applicable. No additional management support is needed unless otherwise documented below in the visit note. 

## 2014-08-17 NOTE — Telephone Encounter (Signed)
See other my chart message.  Addressed.

## 2014-08-18 ENCOUNTER — Ambulatory Visit (INDEPENDENT_AMBULATORY_CARE_PROVIDER_SITE_OTHER)
Admission: RE | Admit: 2014-08-18 | Discharge: 2014-08-18 | Disposition: A | Discharge status: Home / Self Care | Payer: Medicare Other | Source: Ambulatory Visit | Attending: Internal Medicine | Admitting: Internal Medicine

## 2014-08-18 ENCOUNTER — Ambulatory Visit (INDEPENDENT_AMBULATORY_CARE_PROVIDER_SITE_OTHER): Payer: Medicare Other | Admitting: Internal Medicine

## 2014-08-18 ENCOUNTER — Encounter: Payer: Self-pay | Admitting: Internal Medicine

## 2014-08-18 ENCOUNTER — Encounter: Payer: Self-pay | Admitting: *Deleted

## 2014-08-18 VITALS — BP 122/78 | HR 98 | Temp 98.8°F | Ht 68.5 in | Wt 230.2 lb

## 2014-08-18 DIAGNOSIS — R6889 Other general symptoms and signs: Secondary | ICD-10-CM | POA: Diagnosis not present

## 2014-08-18 DIAGNOSIS — R05 Cough: Secondary | ICD-10-CM

## 2014-08-18 DIAGNOSIS — J069 Acute upper respiratory infection, unspecified: Secondary | ICD-10-CM

## 2014-08-18 DIAGNOSIS — R509 Fever, unspecified: Secondary | ICD-10-CM | POA: Diagnosis not present

## 2014-08-18 DIAGNOSIS — R059 Cough, unspecified: Secondary | ICD-10-CM

## 2014-08-18 LAB — POCT INFLUENZA A/B
INFLUENZA B, POC: NEGATIVE
Influenza A, POC: NEGATIVE

## 2014-08-18 MED ORDER — LEVOFLOXACIN 500 MG PO TABS: 500.0000 mg | ORAL_TABLET | Freq: Every day | ORAL | Status: DC

## 2014-08-18 MED ORDER — PREDNISONE 10 MG PO TABS: ORAL_TABLET | ORAL | Status: DC

## 2014-08-18 NOTE — Assessment & Plan Note (Signed)
Worsening symptoms.  Increased cough - productive.  Check cxr.  Stop omnicef.  Start levaquin 500ng q day.  Prednisone taper as directed.  Increase advair to bid.  Rescue inhaler if needed.  Continue tussin as he is doing.  Rest.  Fluids.

## 2014-08-18 NOTE — Assessment & Plan Note (Signed)
With increased congestion and productive cough.  Fever.  Do not feel cxr warranted.  Omnicef as directed.  Saline nasal spray and nasacort as directed.  Mucinex in the am and robitussin in the evening.  Rest.  Fluids.  Explained to him if symptoms changed, worsened or did not resolve - needs to be reevaluated.

## 2014-08-18 NOTE — Progress Notes (Signed)
Patient ID: Joseph Glimpse., male   DOB: 01-31-1946, 69 y.o.   MRN: [id number]   Subjective:    Patient ID: Joseph Glimpse., male    DOB: 12-08-1945, 69 y.o.   MRN: [id number]  HPI  Patient here as a work in with concerns regarding persistent fever, cough and congestion.  Was seen a few days ago.  Placed on omnicef.  Persistent worsening symptoms.  Increased cough productive of thick mucus.  Chokes on mucus.  No sinus pressure. Tmax yesterday 102.  101.2 this am.  Some wheezing.  No chest tightness.  No sob.  Eating and drinking well.  No nausea or vomiting.  No diarrhea.     Past Medical History  Diagnosis Date  . Asthma   . Hypercholesterolemia   . Bladder outlet obstruction   . GERD (gastroesophageal reflux disease)     schatzki ring  . Skin cancer, basal cell   . Crohn's disease     appendix, s/p appendectomy  . Restless leg syndrome   . Seizure disorder     age 21, previously on phenobarbital  . Sleep apnea     Current Outpatient Prescriptions on File Prior to Visit  Medication Sig Dispense Refill  . albuterol (PROVENTIL HFA;VENTOLIN HFA) 108 (90 BASE) MCG/ACT inhaler Inhale 2 puffs into the lungs every 6 (six) hours as needed for wheezing or shortness of breath. 1 Inhaler 1  . calcium gluconate 500 MG tablet Take 500 mg by mouth 2 (two) times daily.    . Cholecalciferol (VITAMIN D-3) 1000 UNITS CAPS Take 1 capsule by mouth daily.    . finasteride (PROSCAR) 5 MG tablet Take 5 mg by mouth daily.    . Fluticasone-Salmeterol (ADVAIR DISKUS) 100-50 MCG/DOSE AEPB INHALE 1 PUFF EVERY DAY 180 each 3  . Glucosamine & Fish Oil 500-400-60-40 MG CAPS Take by mouth.    . levofloxacin (QUIXIN) 0.5 % ophthalmic solution Place 1 drop into both eyes every 6 (six) hours. 5 mL 0  . Magnesium 250 MG TABS Two tablets q day    . omeprazole (PRILOSEC) 20 MG capsule Take 20 mg by mouth daily.    . pramipexole (MIRAPEX) 0.25 MG tablet TAKE 1 AND 1/2 TABLETS DAILY 135 tablet 2  . pravastatin  (PRAVACHOL) 10 MG tablet TAKE 1 TABLET (10 MG TOTAL) BY MOUTH DAILY. 30 tablet 5   No current facility-administered medications on file prior to visit.    Review of Systems  Constitutional: Negative for fever and appetite change.  HENT: Positive for congestion. Negative for sinus pressure and sore throat (better now. ).   Respiratory: Positive for cough (productive mucus production.  colored.  thick.  ) and wheezing. Negative for chest tightness and shortness of breath.   Cardiovascular: Negative for chest pain and leg swelling.  Gastrointestinal: Negative for nausea, vomiting and diarrhea.       Objective:    Physical Exam  HENT:  Mouth/Throat: Oropharynx is clear and moist.  Nares - slightly erythematous turbinates.  No sinus tenderness.   Eyes:  Previous redness and discharge.    Neck: Neck supple.  Cardiovascular: Normal rate and regular rhythm.   Pulmonary/Chest:  Increased cough with forced expiration.  No increased wheezing.  Minimal with forced expiration.   Lymphadenopathy:    He has no cervical adenopathy.    BP 122/78 mmHg  Pulse 98  Temp(Src) 98.8 F (37.1 C) (Oral)  Ht 5' 8.5" (1.74 m)  Wt 230 lb 4 oz (104.441  kg)  BMI 34.50 kg/m2  SpO2 95% Wt Readings from Last 3 Encounters:  08/18/14 230 lb 4 oz (104.441 kg)  08/16/14 230 lb 4 oz (104.441 kg)  07/29/14 229 lb 12.8 oz (104.237 kg)     Lab Results  Component Value Date   WBC 4.8 05/22/2013   HGB 15.7 05/22/2013   HCT 45.5 05/22/2013   PLT 230.0 05/22/2013   GLUCOSE 99 07/26/2014   CHOL 197 07/26/2014   TRIG 105.0 07/26/2014   HDL 53.50 07/26/2014   LDLDIRECT 154.7 05/22/2013   LDLCALC 123* 07/26/2014   ALT 24 07/26/2014   AST 53* 07/26/2014   NA 136 07/26/2014   K 4.4 07/26/2014   CL 100 07/26/2014   CREATININE 1.11 07/26/2014   BUN 13 07/26/2014   CO2 30 07/26/2014   TSH 1.26 05/22/2013    Dg Chest 2 View  08/18/2014   CLINICAL DATA:  Fever.  Productive cough .  EXAM: CHEST  2 VIEW   COMPARISON:  01/28/2013.  FINDINGS: Mediastinum and hilar structures normal. Heart size normal. Mild right base subsegmental atelectasis versus infiltrate. No pleural effusion or pneumothorax. No acute bony abnormality .  IMPRESSION: Mild right base subsegmental atelectasis versus infiltrate.   Electronically Signed   By: Marcello Moores  Register   On: 08/18/2014 14:54       Assessment & Plan:   Problem List Items Addressed This Visit    URI (upper respiratory infection)    Worsening symptoms.  Increased cough - productive.  Check cxr.  Stop omnicef.  Start levaquin 500ng q day.  Prednisone taper as directed.  Increase advair to bid.  Rescue inhaler if needed.  Continue tussin as he is doing.  Rest.  Fluids.         Other Visit Diagnoses    Flu-like symptoms    -  Primary    Relevant Orders    POCT Influenza A/B (Completed)    Cough        Relevant Orders    DG Chest 2 View (Completed)    Fever, unspecified fever cause        Relevant Orders    DG Chest 2 View (Completed)        Einar Pheasant, MD

## 2014-08-18 NOTE — Progress Notes (Signed)
Pre visit review using our clinic review tool, if applicable. No additional management support is needed unless otherwise documented below in the visit note. 

## 2014-09-12 ENCOUNTER — Other Ambulatory Visit: Payer: Self-pay | Admitting: Internal Medicine

## 2014-09-22 DIAGNOSIS — Z85828 Personal history of other malignant neoplasm of skin: Secondary | ICD-10-CM | POA: Diagnosis not present

## 2014-09-22 DIAGNOSIS — Z1283 Encounter for screening for malignant neoplasm of skin: Secondary | ICD-10-CM | POA: Diagnosis not present

## 2014-09-22 DIAGNOSIS — L570 Actinic keratosis: Secondary | ICD-10-CM | POA: Diagnosis not present

## 2014-09-22 DIAGNOSIS — Z08 Encounter for follow-up examination after completed treatment for malignant neoplasm: Secondary | ICD-10-CM | POA: Diagnosis not present

## 2014-10-04 ENCOUNTER — Other Ambulatory Visit: Payer: Self-pay | Admitting: Internal Medicine

## 2014-10-05 ENCOUNTER — Other Ambulatory Visit: Payer: Self-pay | Admitting: *Deleted

## 2014-10-05 MED ORDER — PRAVASTATIN SODIUM 10 MG PO TABS: ORAL_TABLET | ORAL | Status: DC

## 2014-12-28 DIAGNOSIS — H6123 Impacted cerumen, bilateral: Secondary | ICD-10-CM | POA: Diagnosis not present

## 2014-12-28 DIAGNOSIS — H903 Sensorineural hearing loss, bilateral: Secondary | ICD-10-CM | POA: Diagnosis not present

## 2015-01-28 ENCOUNTER — Ambulatory Visit (INDEPENDENT_AMBULATORY_CARE_PROVIDER_SITE_OTHER): Payer: Medicare Other | Admitting: Internal Medicine

## 2015-01-28 ENCOUNTER — Encounter: Payer: Self-pay | Admitting: Internal Medicine

## 2015-01-28 VITALS — BP 126/70 | HR 73 | Temp 98.0°F | Wt 236.0 lb

## 2015-01-28 DIAGNOSIS — R938 Abnormal findings on diagnostic imaging of other specified body structures: Secondary | ICD-10-CM

## 2015-01-28 DIAGNOSIS — R7989 Other specified abnormal findings of blood chemistry: Secondary | ICD-10-CM | POA: Diagnosis not present

## 2015-01-28 DIAGNOSIS — R945 Abnormal results of liver function studies: Secondary | ICD-10-CM

## 2015-01-28 DIAGNOSIS — R9389 Abnormal findings on diagnostic imaging of other specified body structures: Secondary | ICD-10-CM

## 2015-01-28 DIAGNOSIS — E7800 Pure hypercholesterolemia, unspecified: Secondary | ICD-10-CM

## 2015-01-28 DIAGNOSIS — K219 Gastro-esophageal reflux disease without esophagitis: Secondary | ICD-10-CM

## 2015-01-28 DIAGNOSIS — Z23 Encounter for immunization: Secondary | ICD-10-CM

## 2015-01-28 DIAGNOSIS — G2581 Restless legs syndrome: Secondary | ICD-10-CM

## 2015-01-28 DIAGNOSIS — G4730 Sleep apnea, unspecified: Secondary | ICD-10-CM

## 2015-01-28 DIAGNOSIS — J4520 Mild intermittent asthma, uncomplicated: Secondary | ICD-10-CM | POA: Diagnosis not present

## 2015-01-28 NOTE — Progress Notes (Signed)
Patient ID: Joseph Glimpse., male   DOB: 1946-03-23, 69 y.o.   MRN: [id number]   Subjective:    Patient ID: Joseph Glimpse., male    DOB: 03/18/1946, 69 y.o.   MRN: [id number]  HPI  Patient with past history of GERD, abnormal liver function tests, sleep apnea and hypercholesterolemia.  He comes in today for scheduled follow up.  He is accompanied by his wife.  History obtained from both of them.  He is doing well.  Working.  Handling stress.  Discussed diet and exercise.  No cardiac symptoms with increased activity or exertion.  No sob.  No upper GI symptoms reported.  No abdominal pain or cramping.  Bowels stable.  Previously treated for pneumonia.  Symptoms resolved.  Never had repeat cxr.     Past Medical History  Diagnosis Date  . Asthma   . Hypercholesterolemia   . Bladder outlet obstruction   . GERD (gastroesophageal reflux disease)     schatzki ring  . Skin cancer, basal cell   . Crohn's disease ([place])     appendix, s/p appendectomy  . Restless leg syndrome   . Seizure disorder [facility])     age 71, previously on phenobarbital  . Sleep apnea    Past Surgical History  Procedure Laterality Date  . Tonsillectomy and adenoidectomy  1956  . Nasal polyp surgery  1963  . Knee arthroscopy  1982  . Appendectomy  1992    lysis of adhesions (bowel blockage)  . Inguinal hernia surgery  1999    left   Family History  Problem Relation Age of Onset  . Heart disease Father   . Congestive Heart Failure Mother   . Colon cancer Neg Hx   . Prostate cancer Neg Hx    Social History   Social History  . Marital Status: Married    Spouse Name: N/A  . Number of Children: 0  . Years of Education: N/A   Social History Main Topics  . Smoking status: Never Smoker   . Smokeless tobacco: Never Used  . Alcohol Use: No  . Drug Use: No  . Sexual Activity: Not Asked   Other Topics Concern  . None   Social History Narrative    Outpatient Encounter Prescriptions as of 01/28/2015    Medication Sig  . albuterol (PROVENTIL HFA;VENTOLIN HFA) 108 (90 BASE) MCG/ACT inhaler Inhale 2 puffs into the lungs every 6 (six) hours as needed for wheezing or shortness of breath.  . calcium gluconate 500 MG tablet Take 500 mg by mouth 2 (two) times daily.  . Cholecalciferol (VITAMIN D-3) 1000 UNITS CAPS Take 1 capsule by mouth daily.  . finasteride (PROSCAR) 5 MG tablet Take 5 mg by mouth daily.  . Fluticasone-Salmeterol (ADVAIR DISKUS) 100-50 MCG/DOSE AEPB INHALE 1 PUFF EVERY DAY  . Glucosamine & Fish Oil 500-400-60-40 MG CAPS Take by mouth.  . levofloxacin (LEVAQUIN) 500 MG tablet Take 1 tablet (500 mg total) by mouth daily. (Patient not taking: Reported on 01/28/2015)  . levofloxacin (QUIXIN) 0.5 % ophthalmic solution Place 1 drop into both eyes every 6 (six) hours. (Patient not taking: Reported on 01/28/2015)  . Magnesium 250 MG TABS Two tablets q day  . omeprazole (PRILOSEC) 20 MG capsule Take 20 mg by mouth daily.  . pramipexole (MIRAPEX) 0.25 MG tablet TAKE 1 AND 1/2 TABLETS DAILY  . pravastatin (PRAVACHOL) 10 MG tablet TAKE 1 TABLET (10 MG TOTAL) BY MOUTH DAILY.  Marland Kitchen predniSONE (DELTASONE) 10 MG  tablet Take 6 tablets x 1 day and then decrease by 1/2 tablet per day until down to zero mg. (Patient not taking: Reported on 01/28/2015)   No facility-administered encounter medications on file as of 01/28/2015.    Review of Systems  Constitutional: Negative for appetite change and unexpected weight change.  HENT: Negative for congestion and sinus pressure.   Eyes: Negative for pain and visual disturbance.  Respiratory: Negative for cough, chest tightness and shortness of breath.   Cardiovascular: Negative for chest pain, palpitations and leg swelling.  Gastrointestinal: Negative for nausea, vomiting, abdominal pain and diarrhea.  Genitourinary: Negative for dysuria and difficulty urinating.  Musculoskeletal: Negative for back pain and joint swelling.  Skin: Negative for color change and  rash.  Neurological: Negative for dizziness, light-headedness and headaches.  Psychiatric/Behavioral: Negative for dysphoric mood and agitation.       Objective:    Physical Exam  Constitutional: He appears well-developed and well-nourished. No distress.  HENT:  Nose: Nose normal.  Mouth/Throat: Oropharynx is clear and moist.  Eyes: Conjunctivae are normal. Right eye exhibits no discharge. Left eye exhibits no discharge.  Neck: Neck supple. No thyromegaly present.  Cardiovascular: Normal rate and regular rhythm.   Pulmonary/Chest: Effort normal and breath sounds normal. No respiratory distress.  Abdominal: Soft. Bowel sounds are normal. There is no tenderness.  Musculoskeletal: He exhibits no edema or tenderness.  Lymphadenopathy:    He has no cervical adenopathy.  Skin: No rash noted. No erythema.  Psychiatric: He has a normal mood and affect. His behavior is normal.    BP 126/70 mmHg  Pulse 73  Temp(Src) 98 F (36.7 C) (Oral)  Wt 236 lb (107.049 kg)  SpO2 98% Wt Readings from Last 3 Encounters:  01/28/15 236 lb (107.049 kg)  08/18/14 230 lb 4 oz (104.441 kg)  08/16/14 230 lb 4 oz (104.441 kg)     Lab Results  Component Value Date   WBC 4.8 05/22/2013   HGB 15.7 05/22/2013   HCT 45.5 05/22/2013   PLT 230.0 05/22/2013   GLUCOSE 99 07/26/2014   CHOL 197 07/26/2014   TRIG 105.0 07/26/2014   HDL 53.50 07/26/2014   LDLDIRECT 154.7 05/22/2013   LDLCALC 123* 07/26/2014   ALT 24 07/26/2014   AST 53* 07/26/2014   NA 136 07/26/2014   K 4.4 07/26/2014   CL 100 07/26/2014   CREATININE 1.11 07/26/2014   BUN 13 07/26/2014   CO2 30 07/26/2014   TSH 1.26 05/22/2013    Dg Chest 2 View  08/18/2014   CLINICAL DATA:  Fever.  Productive cough .  EXAM: CHEST  2 VIEW  COMPARISON:  01/28/2013.  FINDINGS: Mediastinum and hilar structures normal. Heart size normal. Mild right base subsegmental atelectasis versus infiltrate. No pleural effusion or pneumothorax. No acute bony  abnormality .  IMPRESSION: Mild right base subsegmental atelectasis versus infiltrate.   Electronically Signed   By: Marcello Moores  Register   On: 08/18/2014 14:54       Assessment & Plan:   Problem List Items Addressed This Visit    Abnormal liver function test    Liver panel has been stable.  Follow liver function tests.   Lab Results  Component Value Date   ALT 24 07/26/2014   AST 53* 07/26/2014   ALKPHOS 68 07/26/2014   BILITOT 0.7 07/26/2014        Asthma    Breathing stable.  Received flu shot today.  Pneumovax will be given at his lab appt.  GERD (gastroesophageal reflux disease)    Evaluated by GI.  Had EGD 07/02/14 - hiatal hernia with multiple gastric polyps.  Symptoms controlled.  On prilosec.        Hypercholesterolemia    Low cholesterol diet and exercise.  On pravastatin.  Follow lipid panel and liver function tests.        Restless leg syndrome    On requip.  Doing well.  Follow.       Sleep apnea    Using CPAP.  Follow.        Other Visit Diagnoses    Abnormal CXR    -  Primary    Relevant Orders    DG Chest 2 View        Einar Pheasant, MD

## 2015-01-31 ENCOUNTER — Encounter: Payer: Self-pay | Admitting: Internal Medicine

## 2015-01-31 ENCOUNTER — Ambulatory Visit (INDEPENDENT_AMBULATORY_CARE_PROVIDER_SITE_OTHER)
Admission: RE | Admit: 2015-01-31 | Discharge: 2015-01-31 | Disposition: A | Discharge status: Home / Self Care | Payer: Medicare Other | Source: Ambulatory Visit | Attending: Internal Medicine | Admitting: Internal Medicine

## 2015-01-31 DIAGNOSIS — Z23 Encounter for immunization: Secondary | ICD-10-CM | POA: Diagnosis not present

## 2015-01-31 DIAGNOSIS — R9389 Abnormal findings on diagnostic imaging of other specified body structures: Secondary | ICD-10-CM

## 2015-01-31 DIAGNOSIS — R938 Abnormal findings on diagnostic imaging of other specified body structures: Secondary | ICD-10-CM

## 2015-01-31 DIAGNOSIS — R918 Other nonspecific abnormal finding of lung field: Secondary | ICD-10-CM | POA: Diagnosis not present

## 2015-01-31 NOTE — Assessment & Plan Note (Signed)
Using CPAP.  Follow.  

## 2015-01-31 NOTE — Assessment & Plan Note (Signed)
Liver panel has been stable.  Follow liver function tests.   Lab Results  Component Value Date   ALT 24 07/26/2014   AST 53* 07/26/2014   ALKPHOS 68 07/26/2014   BILITOT 0.7 07/26/2014

## 2015-01-31 NOTE — Assessment & Plan Note (Signed)
Evaluated by GI.  Had EGD 07/02/14 - hiatal hernia with multiple gastric polyps.  Symptoms controlled.  On prilosec.

## 2015-01-31 NOTE — Assessment & Plan Note (Signed)
Low cholesterol diet and exercise.  On pravastatin.  Follow lipid panel and liver function tests.   

## 2015-01-31 NOTE — Addendum Note (Signed)
Addended byCloyd Stagers B on: 01/31/2015 08:19 AM   Modules accepted: Orders

## 2015-01-31 NOTE — Assessment & Plan Note (Signed)
On requip.  Doing well.  Follow.

## 2015-01-31 NOTE — Assessment & Plan Note (Addendum)
Breathing stable.  Received flu shot today.  Pneumovax will be given at his lab appt.

## 2015-02-01 ENCOUNTER — Encounter: Payer: Self-pay | Admitting: Internal Medicine

## 2015-02-11 ENCOUNTER — Other Ambulatory Visit (INDEPENDENT_AMBULATORY_CARE_PROVIDER_SITE_OTHER): Payer: Medicare Other

## 2015-02-11 DIAGNOSIS — E7800 Pure hypercholesterolemia, unspecified: Secondary | ICD-10-CM

## 2015-02-11 DIAGNOSIS — M810 Age-related osteoporosis without current pathological fracture: Secondary | ICD-10-CM | POA: Diagnosis not present

## 2015-02-11 DIAGNOSIS — G4730 Sleep apnea, unspecified: Secondary | ICD-10-CM

## 2015-02-11 DIAGNOSIS — K219 Gastro-esophageal reflux disease without esophagitis: Secondary | ICD-10-CM | POA: Diagnosis not present

## 2015-02-11 DIAGNOSIS — Z23 Encounter for immunization: Secondary | ICD-10-CM

## 2015-02-11 LAB — CBC WITH DIFFERENTIAL/PLATELET
BASOS PCT: 0.7 % (ref 0.0–3.0)
Basophils Absolute: 0 10*3/uL (ref 0.0–0.1)
Eosinophils Absolute: 0.2 10*3/uL (ref 0.0–0.7)
Eosinophils Relative: 2.4 % (ref 0.0–5.0)
HCT: 45.9 % (ref 39.0–52.0)
HEMOGLOBIN: 15.7 g/dL (ref 13.0–17.0)
Lymphocytes Relative: 21.6 % (ref 12.0–46.0)
Lymphs Abs: 1.4 10*3/uL (ref 0.7–4.0)
MCHC: 34.2 g/dL (ref 30.0–36.0)
MCV: 92.6 fl (ref 78.0–100.0)
MONO ABS: 0.6 10*3/uL (ref 0.1–1.0)
Monocytes Relative: 9.2 % (ref 3.0–12.0)
NEUTROS PCT: 66.1 % (ref 43.0–77.0)
Neutro Abs: 4.4 10*3/uL (ref 1.4–7.7)
Platelets: 239 10*3/uL (ref 150.0–400.0)
RBC: 4.96 Mil/uL (ref 4.22–5.81)
RDW: 13.5 % (ref 11.5–15.5)
WBC: 6.7 10*3/uL (ref 4.0–10.5)

## 2015-02-11 LAB — LIPID PANEL
CHOL/HDL RATIO: 4
Cholesterol: 200 mg/dL (ref 0–200)
HDL: 49.5 mg/dL (ref >39.00)
LDL CALC: 128 mg/dL — AB (ref 0–99)
NONHDL: 150.41
Triglycerides: 114 mg/dL (ref 0.0–149.0)
VLDL: 22.8 mg/dL (ref 0.0–40.0)

## 2015-02-11 LAB — TSH: TSH: 2 u[IU]/mL (ref 0.35–4.50)

## 2015-02-11 LAB — VITAMIN D 25 HYDROXY (VIT D DEFICIENCY, FRACTURES): VITD: 26.3 ng/mL — AB (ref 30.00–100.00)

## 2015-02-11 LAB — COMPREHENSIVE METABOLIC PANEL
ALBUMIN: 3.8 g/dL (ref 3.5–5.2)
ALT: 19 U/L (ref 0–53)
AST: 42 U/L — AB (ref 0–37)
Alkaline Phosphatase: 70 U/L (ref 39–117)
BUN: 14 mg/dL (ref 6–23)
CHLORIDE: 102 meq/L (ref 96–112)
CO2: 30 mEq/L (ref 19–32)
CREATININE: 1.12 mg/dL (ref 0.40–1.50)
Calcium: 9.3 mg/dL (ref 8.4–10.5)
GFR: 69.28 mL/min (ref >60.00)
GLUCOSE: 84 mg/dL (ref 70–99)
Potassium: 4.4 mEq/L (ref 3.5–5.1)
Sodium: 139 mEq/L (ref 135–145)
Total Bilirubin: 0.8 mg/dL (ref 0.2–1.2)
Total Protein: 6.6 g/dL (ref 6.0–8.3)

## 2015-02-11 NOTE — Addendum Note (Signed)
Addended by: Bevelyn Ngo on: 02/11/2015 09:48 AM   Modules accepted: Orders

## 2015-02-13 ENCOUNTER — Encounter: Payer: Self-pay | Admitting: Internal Medicine

## 2015-04-01 ENCOUNTER — Other Ambulatory Visit: Payer: Self-pay | Admitting: Internal Medicine

## 2015-06-04 ENCOUNTER — Encounter: Payer: Self-pay | Admitting: Internal Medicine

## 2015-06-06 ENCOUNTER — Other Ambulatory Visit: Payer: Self-pay | Admitting: *Deleted

## 2015-06-06 MED ORDER — PRAMIPEXOLE DIHYDROCHLORIDE 0.25 MG PO TABS: 0.3750 mg | ORAL_TABLET | Freq: Every day | ORAL | Status: DC

## 2015-06-06 MED ORDER — PRAVASTATIN SODIUM 10 MG PO TABS: ORAL_TABLET | ORAL | Status: DC

## 2015-06-07 ENCOUNTER — Other Ambulatory Visit: Payer: Self-pay | Admitting: Internal Medicine

## 2015-06-28 ENCOUNTER — Emergency Department
Admission: EM | Admit: 2015-06-28 | Discharge: 2015-06-28 | Disposition: A | Discharge status: Home / Self Care | Payer: Medicare Other | Attending: Emergency Medicine | Admitting: Emergency Medicine

## 2015-06-28 ENCOUNTER — Encounter: Payer: Self-pay | Admitting: Medical Oncology

## 2015-06-28 DIAGNOSIS — A029 Salmonella infection, unspecified: Secondary | ICD-10-CM | POA: Diagnosis not present

## 2015-06-28 DIAGNOSIS — Z7951 Long term (current) use of inhaled steroids: Secondary | ICD-10-CM | POA: Diagnosis not present

## 2015-06-28 DIAGNOSIS — R197 Diarrhea, unspecified: Secondary | ICD-10-CM

## 2015-06-28 DIAGNOSIS — Z79899 Other long term (current) drug therapy: Secondary | ICD-10-CM | POA: Insufficient documentation

## 2015-06-28 DIAGNOSIS — R509 Fever, unspecified: Secondary | ICD-10-CM | POA: Diagnosis not present

## 2015-06-28 LAB — COMPREHENSIVE METABOLIC PANEL
ALK PHOS: 75 U/L (ref 38–126)
ALT: 21 U/L (ref 17–63)
ANION GAP: 9 (ref 5–15)
AST: 53 U/L — ABNORMAL HIGH (ref 15–41)
Albumin: 4.1 g/dL (ref 3.5–5.0)
BILIRUBIN TOTAL: 0.7 mg/dL (ref 0.3–1.2)
BUN: 11 mg/dL (ref 6–20)
CALCIUM: 9.1 mg/dL (ref 8.9–10.3)
CO2: 26 mmol/L (ref 22–32)
CREATININE: 1.26 mg/dL — AB (ref 0.61–1.24)
Chloride: 100 mmol/L — ABNORMAL LOW (ref 101–111)
GFR calc non Af Amer: 57 mL/min — ABNORMAL LOW (ref >60)
GLUCOSE: 95 mg/dL (ref 65–99)
Potassium: 3.2 mmol/L — ABNORMAL LOW (ref 3.5–5.1)
Sodium: 135 mmol/L (ref 135–145)
TOTAL PROTEIN: 8 g/dL (ref 6.5–8.1)

## 2015-06-28 LAB — GASTROINTESTINAL PANEL BY PCR, STOOL (REPLACES STOOL CULTURE)
ADENOVIRUS F40/41: NOT DETECTED
Astrovirus: NOT DETECTED
CAMPYLOBACTER SPECIES: NOT DETECTED
CRYPTOSPORIDIUM: NOT DETECTED
Cyclospora cayetanensis: NOT DETECTED
E. coli O157: NOT DETECTED
ENTEROAGGREGATIVE E COLI (EAEC): NOT DETECTED
ENTEROPATHOGENIC E COLI (EPEC): NOT DETECTED
Entamoeba histolytica: NOT DETECTED
Enterotoxigenic E coli (ETEC): NOT DETECTED
GIARDIA LAMBLIA: NOT DETECTED
NOROVIRUS GI/GII: NOT DETECTED
PLESIMONAS SHIGELLOIDES: NOT DETECTED
ROTAVIRUS A: NOT DETECTED
SHIGA LIKE TOXIN PRODUCING E COLI (STEC): NOT DETECTED
Salmonella species: DETECTED — AB
Sapovirus (I, II, IV, and V): NOT DETECTED
Shigella/Enteroinvasive E coli (EIEC): NOT DETECTED
Vibrio cholerae: NOT DETECTED
Vibrio species: NOT DETECTED
YERSINIA ENTEROCOLITICA: NOT DETECTED

## 2015-06-28 LAB — CBC
HCT: 47.3 % (ref 40.0–52.0)
HEMOGLOBIN: 16.6 g/dL (ref 13.0–18.0)
MCH: 31.5 pg (ref 26.0–34.0)
MCHC: 35 g/dL (ref 32.0–36.0)
MCV: 89.8 fL (ref 80.0–100.0)
PLATELETS: 251 10*3/uL (ref 150–440)
RBC: 5.27 MIL/uL (ref 4.40–5.90)
RDW: 13.2 % (ref 11.5–14.5)
WBC: 7.2 10*3/uL (ref 3.8–10.6)

## 2015-06-28 LAB — LIPASE, BLOOD: Lipase: 38 U/L (ref 11–51)

## 2015-06-28 MED ORDER — CIPROFLOXACIN HCL 500 MG PO TABS: 500.0000 mg | ORAL_TABLET | Freq: Two times a day (BID) | ORAL | Status: DC

## 2015-06-28 NOTE — ED Notes (Signed)
Pt reports that he began having diarrhea Friday. Denies pain. Denies vomiting.

## 2015-06-28 NOTE — ED Provider Notes (Signed)
[facility] Emergency Department Provider Note  ____________________________________________    I have reviewed the triage vital signs and the nursing notes.   HISTORY  Chief Complaint Diarrhea    HPI Joseph Good. is a 70 y.o. male who presents with complaints of diarrhea for 2 days. Patient reports fevers at home. He denies abdominal pain. No nausea or vomiting. He reports he's had this several times in the past and is not sure what causes it. He has appointment with his gastroenterologist next week but is not sure what to do in the meantime. He has not been taking Imodium because he has been told not to take salicylates because of a distant history of an esophageal ulcer. Currently feels well. He denies dizziness. No blood in his stool. No travel. No camping     Past Medical History  Diagnosis Date  . Asthma   . Hypercholesterolemia   . Bladder outlet obstruction   . GERD (gastroesophageal reflux disease)     schatzki ring  . Skin cancer, basal cell   . Crohn's disease ([place])     appendix, s/p appendectomy  . Restless leg syndrome   . Seizure disorder [facility])     age 14, previously on phenobarbital  . Sleep apnea     Patient Active Problem List   Diagnosis Date Noted  . URI (upper respiratory infection) 08/18/2014  . Health care maintenance 08/01/2014  . Abnormal liver function test 05/19/2012  . Osteoporosis 05/19/2012  . Asthma 04/29/2012  . Hypercholesterolemia 04/29/2012  . GERD (gastroesophageal reflux disease) 04/29/2012  . Restless leg syndrome 04/29/2012  . Sleep apnea 04/29/2012    Past Surgical History  Procedure Laterality Date  . Tonsillectomy and adenoidectomy  1956  . Nasal polyp surgery  1963  . Knee arthroscopy  1982  . Appendectomy  1992    lysis of adhesions (bowel blockage)  . Inguinal hernia surgery  1999    left    Current Outpatient Rx  Name  Route  Sig  Dispense  Refill  . albuterol (PROVENTIL  HFA;VENTOLIN HFA) 108 (90 BASE) MCG/ACT inhaler   Inhalation   Inhale 2 puffs into the lungs every 6 (six) hours as needed for wheezing or shortness of breath.   1 Inhaler   1   . calcium gluconate 500 MG tablet   Oral   Take 500 mg by mouth 2 (two) times daily.         . Cholecalciferol (VITAMIN D-3) 1000 UNITS CAPS   Oral   Take 1 capsule by mouth daily.         . finasteride (PROSCAR) 5 MG tablet   Oral   Take 5 mg by mouth daily.         . Fluticasone-Salmeterol (ADVAIR DISKUS) 100-50 MCG/DOSE AEPB      INHALE 1 PUFF EVERY DAY   180 each   3   . Glucosamine & Fish Oil 500-400-60-40 MG CAPS   Oral   Take by mouth.         . levofloxacin (LEVAQUIN) 500 MG tablet   Oral   Take 1 tablet (500 mg total) by mouth daily. Patient not taking: Reported on 01/28/2015   10 tablet   0   . levofloxacin (QUIXIN) 0.5 % ophthalmic solution   Both Eyes   Place 1 drop into both eyes every 6 (six) hours. Patient not taking: Reported on 01/28/2015   5 mL   0   . Magnesium  250 MG TABS      Two tablets q day         . omeprazole (PRILOSEC) 20 MG capsule   Oral   Take 20 mg by mouth daily.         . pramipexole (MIRAPEX) 0.25 MG tablet   Oral   Take 1.5 tablets (0.375 mg total) by mouth daily.   135 tablet   3   . pramipexole (MIRAPEX) 0.25 MG tablet      TAKE 1 AND 1/2 TABLETS DAILY   135 tablet   2   . pravastatin (PRAVACHOL) 10 MG tablet      TAKE 1 TABLET (10 MG TOTAL) BY MOUTH DAILY.   90 tablet   3   . predniSONE (DELTASONE) 10 MG tablet      Take 6 tablets x 1 day and then decrease by 1/2 tablet per day until down to zero mg. Patient not taking: Reported on 01/28/2015   39 tablet   0     Allergies Seldane  Family History  Problem Relation Age of Onset  . Heart disease Father   . Congestive Heart Failure Mother   . Colon cancer Neg Hx   . Prostate cancer Neg Hx     Social History Social History  Substance Use Topics  . Smoking  status: Never Smoker   . Smokeless tobacco: Never Used  . Alcohol Use: No    Review of Systems  Constitutional: Intermittent fevers Eyes: Negative for visual changes. ENT: Negative for sore throat Cardiovascular: Negative for chest pain. Respiratory: Negative for shortness of breath. No cough Gastrointestinal: Negative for abdominal pain Genitourinary: Negative for dysuria. Musculoskeletal: Negative for back pain. Skin: Negative for rash. Neurological: Negative for headaches  Psychiatric: No anxiety    ____________________________________________   PHYSICAL EXAM:  VITAL SIGNS: ED Triage Vitals  Enc Vitals Group     BP 06/28/15 1213 151/101 mmHg     Pulse Rate 06/28/15 1213 79     Resp 06/28/15 1213 18     Temp 06/28/15 1213 98.7 F (37.1 C)     Temp Source 06/28/15 1213 Oral     SpO2 06/28/15 1213 97 %     Weight 06/28/15 1213 222 lb (100.699 kg)     Height 06/28/15 1213 5\' 9"  (1.753 m)     Head Cir --      Peak Flow --      Pain Score --      Pain Loc --      Pain Edu? --      Excl. in [place]? --      Constitutional: Alert and oriented. Well appearing and in no distress. Pleasant and interactive Eyes: Conjunctivae are normal.  ENT   Head: Normocephalic and atraumatic.   Mouth/Throat: Mucous membranes are moist. Cardiovascular: Normal rate, regular rhythm. Normal and symmetric distal pulses are present in all extremities. Respiratory: Normal respiratory effort without tachypnea nor retractions. Breath sounds are clear and equal bilaterally.  Gastrointestinal: Soft and non-tender in all quadrants. No distention. There is no CVA tenderness. No pulsatile mass Genitourinary: deferred Musculoskeletal: Nontender with normal range of motion in all extremities. No lower extremity tenderness nor edema. Neurologic:  Normal speech and language. No gross focal neurologic deficits are appreciated. Skin:  Skin is warm, dry and intact. No rash noted. Psychiatric: Mood  and affect are normal. Patient exhibits appropriate insight and judgment.  ____________________________________________    LABS (pertinent positives/negatives)  Labs Reviewed  COMPREHENSIVE METABOLIC  PANEL - Abnormal; Notable for the following:    Potassium 3.2 (*)    Chloride 100 (*)    Creatinine, Ser 1.26 (*)    AST 53 (*)    GFR calc non Af Amer 57 (*)    All other components within normal limits  GASTROINTESTINAL PANEL BY PCR, STOOL (REPLACES STOOL CULTURE)  LIPASE, BLOOD  CBC    ____________________________________________   EKG  None  ____________________________________________    RADIOLOGY I have personally reviewed any xrays that were ordered on this patient: None  ____________________________________________   PROCEDURES  Procedure(s) performed: none  Critical Care performed: none  ____________________________________________   INITIAL IMPRESSION / ASSESSMENT AND PLAN / ED COURSE  Pertinent labs & imaging results that were available during my care of the patient were reviewed by me and considered in my medical decision making (see chart for details).  Patient presents with complaints of 2 days of diarrhea. His vital signs are reassuring. He does have a mild elevation in his creatinine and a slight decrease in potassium which is to be expected. CBC is reassuring. We will send stool culture. Advised that trial of Imodium is probably okay and may help the symptoms. I will Rx antibiotics to take if no improvement of his symptoms over the next 48 hours   ----------------------------------------- 6:31 PM on 06/28/2015 -----------------------------------------  Stool culture returned positive for Salmonella, I called the patient and informed him to start taking the Cipro now  ____________________________________________   FINAL CLINICAL IMPRESSION(S) / ED DIAGNOSES  Final diagnoses:  Diarrhea, unspecified type   Salmonella   Lavonia Drafts,  MD 06/28/15 [phone]

## 2015-06-28 NOTE — Discharge Instructions (Signed)

## 2015-07-28 ENCOUNTER — Encounter: Payer: Self-pay | Admitting: Internal Medicine

## 2015-07-28 ENCOUNTER — Ambulatory Visit (INDEPENDENT_AMBULATORY_CARE_PROVIDER_SITE_OTHER): Payer: Medicare Other | Admitting: Internal Medicine

## 2015-07-28 VITALS — BP 120/80 | HR 67 | Temp 98.1°F | Resp 18 | Ht 68.0 in | Wt 235.4 lb

## 2015-07-28 DIAGNOSIS — K219 Gastro-esophageal reflux disease without esophagitis: Secondary | ICD-10-CM | POA: Diagnosis not present

## 2015-07-28 DIAGNOSIS — G4730 Sleep apnea, unspecified: Secondary | ICD-10-CM

## 2015-07-28 DIAGNOSIS — J4520 Mild intermittent asthma, uncomplicated: Secondary | ICD-10-CM

## 2015-07-28 DIAGNOSIS — E7800 Pure hypercholesterolemia, unspecified: Secondary | ICD-10-CM

## 2015-07-28 DIAGNOSIS — G2581 Restless legs syndrome: Secondary | ICD-10-CM

## 2015-07-28 DIAGNOSIS — R945 Abnormal results of liver function studies: Secondary | ICD-10-CM

## 2015-07-28 DIAGNOSIS — R7989 Other specified abnormal findings of blood chemistry: Secondary | ICD-10-CM

## 2015-07-28 NOTE — Progress Notes (Signed)
Pre-visit discussion using our clinic review tool. No additional management support is needed unless otherwise documented below in the visit note.  

## 2015-07-28 NOTE — Progress Notes (Signed)
Patient ID: Joseph Glimpse., male   DOB: [birth year], 70 y.o.   MRN: [id number]   Subjective:    Patient ID: Joseph Glimpse., male    DOB: [birth year], 70 y.o.   MRN: [id number]  HPI  Patient here for a scheduled follow up.  He is doing well.  Feels good.  No chest pain or tightness.  No sob.  No acid reflux.  No abdominal pain or cramping.  Bowels stable.  Not watching his diet as well.  Not exercising as much.  Plans to start exercising and watching diet more.     Past Medical History  Diagnosis Date  . Asthma   . Hypercholesterolemia   . Bladder outlet obstruction   . GERD (gastroesophageal reflux disease)     schatzki ring  . Skin cancer, basal cell   . Crohn's disease ([place])     appendix, s/p appendectomy  . Restless leg syndrome   . Seizure disorder [facility])     age 42, previously on phenobarbital  . Sleep apnea    Past Surgical History  Procedure Laterality Date  . Tonsillectomy and adenoidectomy  1956  . Nasal polyp surgery  1963  . Knee arthroscopy  1982  . Appendectomy  1992    lysis of adhesions (bowel blockage)  . Inguinal hernia surgery  1999    left   Family History  Problem Relation Age of Onset  . Heart disease Father   . Congestive Heart Failure Mother   . Colon cancer Neg Hx   . Prostate cancer Neg Hx    Social History   Social History  . Marital Status: Married    Spouse Name: N/A  . Number of Children: 0  . Years of Education: N/A   Social History Main Topics  . Smoking status: Never Smoker   . Smokeless tobacco: Never Used  . Alcohol Use: No  . Drug Use: No  . Sexual Activity: Not Asked   Other Topics Concern  . None   Social History Narrative    Outpatient Encounter Prescriptions as of 07/28/2015  Medication Sig  . albuterol (PROVENTIL HFA;VENTOLIN HFA) 108 (90 BASE) MCG/ACT inhaler Inhale 2 puffs into the lungs every 6 (six) hours as needed for wheezing or shortness of breath.  . calcium gluconate 500 MG tablet Take 500 mg by  mouth 2 (two) times daily.  . Cholecalciferol (VITAMIN D-3) 1000 UNITS CAPS Take 1 capsule by mouth daily.  . finasteride (PROSCAR) 5 MG tablet Take 5 mg by mouth daily.  . Fluticasone-Salmeterol (ADVAIR DISKUS) 100-50 MCG/DOSE AEPB INHALE 1 PUFF EVERY DAY  . Magnesium 250 MG TABS Two tablets q day  . omeprazole (PRILOSEC) 20 MG capsule Take 20 mg by mouth daily.  . pramipexole (MIRAPEX) 0.25 MG tablet TAKE 1 AND 1/2 TABLETS DAILY  . pravastatin (PRAVACHOL) 10 MG tablet TAKE 1 TABLET (10 MG TOTAL) BY MOUTH DAILY.  . [DISCONTINUED] ciprofloxacin (CIPRO) 500 MG tablet Take 1 tablet (500 mg total) by mouth 2 (two) times daily.  . [DISCONTINUED] Glucosamine & Fish Oil 500-400-60-40 MG CAPS Take by mouth.  . [DISCONTINUED] levofloxacin (LEVAQUIN) 500 MG tablet Take 1 tablet (500 mg total) by mouth daily. (Patient not taking: Reported on 01/28/2015)  . [DISCONTINUED] levofloxacin (QUIXIN) 0.5 % ophthalmic solution Place 1 drop into both eyes every 6 (six) hours. (Patient not taking: Reported on 01/28/2015)  . [DISCONTINUED] pramipexole (MIRAPEX) 0.25 MG tablet Take 1.5 tablets (0.375 mg total) by mouth daily.  . [  DISCONTINUED] predniSONE (DELTASONE) 10 MG tablet Take 6 tablets x 1 day and then decrease by 1/2 tablet per day until down to zero mg. (Patient not taking: Reported on 01/28/2015)   No facility-administered encounter medications on file as of 07/28/2015.    Review of Systems  Constitutional: Negative for appetite change and unexpected weight change.  HENT: Negative for congestion and sinus pressure.   Respiratory: Negative for cough, chest tightness and shortness of breath.   Cardiovascular: Negative for chest pain, palpitations and leg swelling.  Gastrointestinal: Negative for nausea, vomiting, abdominal pain and diarrhea.  Genitourinary: Negative for dysuria and difficulty urinating.  Musculoskeletal: Negative for back pain and joint swelling.  Skin: Negative for color change and rash.    Neurological: Negative for dizziness, light-headedness and headaches.  Psychiatric/Behavioral: Negative for dysphoric mood and agitation.       Objective:    Physical Exam  Constitutional: He appears well-developed and well-nourished. No distress.  HENT:  Nose: Nose normal.  Mouth/Throat: Oropharynx is clear and moist.  Neck: Neck supple. No thyromegaly present.  Cardiovascular: Normal rate and regular rhythm.   Pulmonary/Chest: Effort normal and breath sounds normal. No respiratory distress.  Abdominal: Soft. Bowel sounds are normal. There is no tenderness.  Musculoskeletal: He exhibits no edema or tenderness.  Lymphadenopathy:    He has no cervical adenopathy.  Skin: No rash noted. No erythema.  Psychiatric: He has a normal mood and affect. His behavior is normal.    BP 120/80 mmHg  Pulse 67  Temp(Src) 98.1 F (36.7 C) (Oral)  Resp 18  Ht 5\' 8"  (1.727 m)  Wt 235 lb 6 oz (106.765 kg)  BMI 35.80 kg/m2  SpO2 96% Wt Readings from Last 3 Encounters:  07/28/15 235 lb 6 oz (106.765 kg)  06/28/15 222 lb (100.699 kg)  01/28/15 236 lb (107.049 kg)     Lab Results  Component Value Date   WBC 7.2 06/28/2015   HGB 16.6 06/28/2015   HCT 47.3 06/28/2015   PLT 251 06/28/2015   GLUCOSE 95 06/28/2015   CHOL 200 02/11/2015   TRIG 114.0 02/11/2015   HDL 49.50 02/11/2015   LDLDIRECT 154.7 05/22/2013   LDLCALC 128* 02/11/2015   ALT 21 06/28/2015   AST 53* 06/28/2015   NA 135 06/28/2015   K 3.2* 06/28/2015   CL 100* 06/28/2015   CREATININE 1.26* 06/28/2015   BUN 11 06/28/2015   CO2 26 06/28/2015   TSH 2.00 02/11/2015       Assessment & Plan:   Problem List Items Addressed This Visit    Abnormal liver function test    Liver function tests stable.  Has been slightly elevated.  Will follow.        Relevant Orders   Comprehensive metabolic panel   Asthma    Breathing stable.  On advair.  Follow.        GERD (gastroesophageal reflux disease)    On omeprazole.   Follow.  Upper symptoms controlled.        Hypercholesterolemia - Primary    Low cholesterol diet and exercise.  Follow lipid panel.        Relevant Orders   Lipid panel   Restless leg syndrome    mirapex working well.  Follow.        Sleep apnea    Using CPAP.            Einar Pheasant, MD

## 2015-07-31 ENCOUNTER — Emergency Department
Admission: EM | Admit: 2015-07-31 | Discharge: 2015-07-31 | Disposition: A | Discharge status: Home / Self Care | Payer: Medicare Other | Attending: Emergency Medicine | Admitting: Emergency Medicine

## 2015-07-31 ENCOUNTER — Encounter: Payer: Self-pay | Admitting: Emergency Medicine

## 2015-07-31 ENCOUNTER — Emergency Department: Payer: Medicare Other

## 2015-07-31 DIAGNOSIS — E7800 Pure hypercholesterolemia, unspecified: Secondary | ICD-10-CM | POA: Insufficient documentation

## 2015-07-31 DIAGNOSIS — G40909 Epilepsy, unspecified, not intractable, without status epilepticus: Secondary | ICD-10-CM | POA: Diagnosis not present

## 2015-07-31 DIAGNOSIS — Z888 Allergy status to other drugs, medicaments and biological substances status: Secondary | ICD-10-CM | POA: Insufficient documentation

## 2015-07-31 DIAGNOSIS — R0602 Shortness of breath: Secondary | ICD-10-CM | POA: Diagnosis not present

## 2015-07-31 DIAGNOSIS — R112 Nausea with vomiting, unspecified: Secondary | ICD-10-CM

## 2015-07-31 DIAGNOSIS — J45909 Unspecified asthma, uncomplicated: Secondary | ICD-10-CM | POA: Diagnosis not present

## 2015-07-31 DIAGNOSIS — Z79899 Other long term (current) drug therapy: Secondary | ICD-10-CM | POA: Insufficient documentation

## 2015-07-31 DIAGNOSIS — C4491 Basal cell carcinoma of skin, unspecified: Secondary | ICD-10-CM | POA: Diagnosis not present

## 2015-07-31 DIAGNOSIS — E871 Hypo-osmolality and hyponatremia: Secondary | ICD-10-CM | POA: Diagnosis not present

## 2015-07-31 DIAGNOSIS — R197 Diarrhea, unspecified: Secondary | ICD-10-CM | POA: Insufficient documentation

## 2015-07-31 DIAGNOSIS — R509 Fever, unspecified: Secondary | ICD-10-CM | POA: Diagnosis not present

## 2015-07-31 DIAGNOSIS — R05 Cough: Secondary | ICD-10-CM | POA: Diagnosis not present

## 2015-07-31 LAB — CBC
HEMATOCRIT: 46 % (ref 40.0–52.0)
HEMOGLOBIN: 15.8 g/dL (ref 13.0–18.0)
MCH: 32.1 pg (ref 26.0–34.0)
MCHC: 34.4 g/dL (ref 32.0–36.0)
MCV: 93.2 fL (ref 80.0–100.0)
Platelets: 184 10*3/uL (ref 150–440)
RBC: 4.94 MIL/uL (ref 4.40–5.90)
RDW: 13.7 % (ref 11.5–14.5)
WBC: 18.8 10*3/uL — AB (ref 3.8–10.6)

## 2015-07-31 LAB — HEPATIC FUNCTION PANEL
ALBUMIN: 3.9 g/dL (ref 3.5–5.0)
ALK PHOS: 67 U/L (ref 38–126)
ALT: 20 U/L (ref 17–63)
AST: 61 U/L — AB (ref 15–41)
BILIRUBIN TOTAL: 0.9 mg/dL (ref 0.3–1.2)
Bilirubin, Direct: 0.1 mg/dL (ref 0.1–0.5)
Indirect Bilirubin: 0.8 mg/dL (ref 0.3–0.9)
Total Protein: 7.4 g/dL (ref 6.5–8.1)

## 2015-07-31 LAB — BASIC METABOLIC PANEL
ANION GAP: 6 (ref 5–15)
BUN: 16 mg/dL (ref 6–20)
CALCIUM: 8.8 mg/dL — AB (ref 8.9–10.3)
CHLORIDE: 100 mmol/L — AB (ref 101–111)
CO2: 25 mmol/L (ref 22–32)
Creatinine, Ser: 1.05 mg/dL (ref 0.61–1.24)
GFR calc non Af Amer: >60 mL/min (ref >60)
Glucose, Bld: 104 mg/dL — ABNORMAL HIGH (ref 65–99)
Potassium: 3.7 mmol/L (ref 3.5–5.1)
Sodium: 131 mmol/L — ABNORMAL LOW (ref 135–145)

## 2015-07-31 LAB — RAPID INFLUENZA A&B ANTIGENS: Influenza A (ARMC): NEGATIVE

## 2015-07-31 LAB — RAPID INFLUENZA A&B ANTIGENS (ARMC ONLY): INFLUENZA B (ARMC): NEGATIVE

## 2015-07-31 MED ORDER — ONDANSETRON HCL 4 MG/2ML IJ SOLN
4.0000 mg | Freq: Once | INTRAMUSCULAR | Status: AC
  Administered 2015-07-31: 4 mg via INTRAVENOUS
  Filled 2015-07-31: qty 2

## 2015-07-31 MED ORDER — ONDANSETRON 4 MG PO TBDP: 4.0000 mg | ORAL_TABLET | Freq: Three times a day (TID) | ORAL | Status: DC | PRN

## 2015-07-31 MED ORDER — LOPERAMIDE HCL 2 MG PO TABS: 2.0000 mg | ORAL_TABLET | Freq: Four times a day (QID) | ORAL | Status: DC | PRN

## 2015-07-31 MED ORDER — SODIUM CHLORIDE 0.9 % IV BOLUS (SEPSIS)
1000.0000 mL | Freq: Once | INTRAVENOUS | Status: AC
  Administered 2015-07-31: 1000 mL via INTRAVENOUS

## 2015-07-31 NOTE — ED Notes (Signed)
Patient transported to x-ray. ?

## 2015-07-31 NOTE — Assessment & Plan Note (Signed)
Low cholesterol diet and exercise.  Follow lipid panel.   

## 2015-07-31 NOTE — Assessment & Plan Note (Signed)
Liver function tests stable.  Has been slightly elevated.  Will follow.

## 2015-07-31 NOTE — ED Notes (Signed)
Patient Arrives to [facility] ED with c/o N/V/D, Fever, Cough, Chills since this morning. Patient has recent history of Salmonella .

## 2015-07-31 NOTE — ED Provider Notes (Signed)
[facility] Emergency Department Provider Note  ____________________________________________  Time seen: Approximately 3:51 PM  I have reviewed the triage vital signs and the nursing notes.   HISTORY  Chief Complaint Influenza    HPI Joseph Kase. is a 70 y.o. male with a history of asthma presenting for nausea vomiting diarrhea and shortness of breath. The patient reports that this morning he had several episodes of nausea and vomiting as well as loose stool that was nonbloody. There was no associated abdominal pain. He was trying to stay well-hydrated and drink a large amount of water and Gatorade, and developed shortness of breath. He had a large bout of emesis "almost a gallon worth" and after the emesis his shortness of breath completely resolved. He has not had any cough or cold symptoms, congestion or rhinorrhea, fever or chills. No chest pain, palpitations, lightheadedness or syncope. At this time, the patient's symptoms are completely gone and he has not had any further episodes of vomiting or diarrhea since his arrival to the emergency department.   Past Medical History  Diagnosis Date  . Asthma   . Hypercholesterolemia   . Bladder outlet obstruction   . GERD (gastroesophageal reflux disease)     schatzki ring  . Skin cancer, basal cell   . Crohn's disease ([place])     appendix, s/p appendectomy  . Restless leg syndrome   . Seizure disorder [facility])     age 52, previously on phenobarbital  . Sleep apnea     Patient Active Problem List   Diagnosis Date Noted  . URI (upper respiratory infection) 08/18/2014  . Health care maintenance 08/01/2014  . Abnormal liver function test 05/19/2012  . Osteoporosis 05/19/2012  . Asthma 04/29/2012  . Hypercholesterolemia 04/29/2012  . GERD (gastroesophageal reflux disease) 04/29/2012  . Restless leg syndrome 04/29/2012  . Sleep apnea 04/29/2012    Past Surgical History  Procedure Laterality Date  .  Tonsillectomy and adenoidectomy  1956  . Nasal polyp surgery  1963  . Knee arthroscopy  1982  . Appendectomy  1992    lysis of adhesions (bowel blockage)  . Inguinal hernia surgery  1999    left    Current Outpatient Rx  Name  Route  Sig  Dispense  Refill  . albuterol (PROVENTIL HFA;VENTOLIN HFA) 108 (90 BASE) MCG/ACT inhaler   Inhalation   Inhale 2 puffs into the lungs every 6 (six) hours as needed for wheezing or shortness of breath.   1 Inhaler   1   . calcium gluconate 500 MG tablet   Oral   Take 500 mg by mouth 2 (two) times daily.         . Cholecalciferol (VITAMIN D-3) 1000 UNITS CAPS   Oral   Take 1 capsule by mouth daily.         . finasteride (PROSCAR) 5 MG tablet   Oral   Take 5 mg by mouth daily.         . Fluticasone-Salmeterol (ADVAIR DISKUS) 100-50 MCG/DOSE AEPB      INHALE 1 PUFF EVERY DAY   180 each   3   . loperamide (IMODIUM A-D) 2 MG tablet   Oral   Take 1 tablet (2 mg total) by mouth 4 (four) times daily as needed for diarrhea or loose stools.   12 tablet   0   . Magnesium 250 MG TABS      Two tablets q day         .  omeprazole (PRILOSEC) 20 MG capsule   Oral   Take 20 mg by mouth daily.         . ondansetron (ZOFRAN ODT) 4 MG disintegrating tablet   Oral   Take 1 tablet (4 mg total) by mouth every 8 (eight) hours as needed for nausea or vomiting.   20 tablet   0   . pramipexole (MIRAPEX) 0.25 MG tablet      TAKE 1 AND 1/2 TABLETS DAILY   135 tablet   2   . pravastatin (PRAVACHOL) 10 MG tablet      TAKE 1 TABLET (10 MG TOTAL) BY MOUTH DAILY.   90 tablet   3     Allergies Seldane  Family History  Problem Relation Age of Onset  . Heart disease Father   . Congestive Heart Failure Mother   . Colon cancer Neg Hx   . Prostate cancer Neg Hx     Social History Social History  Substance Use Topics  . Smoking status: Never Smoker   . Smokeless tobacco: Never Used  . Alcohol Use: No    Review of  Systems Constitutional: No fever/chills. No lightheadedness or syncope. Eyes: No visual changes. No eye discharge. ENT: No sore throat. No congestion or rhinorrhea. Cardiovascular: Denies chest pain. Denies palpitations. Respiratory: Positive shortness of breath.  No cough. Gastrointestinal: No abdominal pain.  Positive nausea, positive vomiting.  As it diarrhea.  No constipation. Genitourinary: Negative for dysuria. Musculoskeletal: Negative for back pain. Skin: Negative for rash. Neurological: Negative for headaches. No focal numbness, tingling or weakness.   10-point ROS otherwise negative.  ____________________________________________   PHYSICAL EXAM:  VITAL SIGNS: ED Triage Vitals  Enc Vitals Group     BP 07/31/15 1345 148/76 mmHg     Pulse Rate 07/31/15 1345 90     Resp 07/31/15 1345 20     Temp 07/31/15 1345 99.2 F (37.3 C)     Temp Source 07/31/15 1345 Oral     SpO2 07/31/15 1345 94 %     Weight 07/31/15 1345 225 lb (102.059 kg)     Height 07/31/15 1345 5\' 8"  (1.727 m)     Head Cir --      Peak Flow --      Pain Score --      Pain Loc --      Pain Edu? --      Excl. in [place]? --     Constitutional: Alert and oriented. Well appearing and in no acute distress. Answers questions appropriately. Eyes: Conjunctivae are normal.  EOMI. No scleral icterus. Head: Atraumatic. Nose: No congestion/rhinnorhea. Mouth/Throat: Mucous membranes are moist.  Neck: No stridor.  Supple.  JVD Cardiovascular: Normal rate, regular rhythm. No murmurs, rubs or gallops.  Respiratory: Normal respiratory effort.  No accessory muscle use or retractions. Lungs CTAB.  No wheezes, rales or ronchi. Gastrointestinal: Obese. Soft, nontender and nondistended.  No guarding or rebound.  No peritoneal signs. Musculoskeletal: No LE edema. No ttp in the calves or palpable cords.  Negative Homan's sign. Neurologic:  A&Ox3.  Speech is clear.  Face and smile are symmetric.  EOMI.  Moves all extremities  well. Skin:  Skin is warm, dry and intact. No rash noted. Psychiatric: Mood and affect are normal. Speech and behavior are normal.  Normal judgement.  ____________________________________________   LABS (all labs ordered are listed, but only abnormal results are displayed)  Labs Reviewed  CBC - Abnormal; Notable for the following:    WBC  18.8 (*)    All other components within normal limits  BASIC METABOLIC PANEL - Abnormal; Notable for the following:    Sodium 131 (*)    Chloride 100 (*)    Glucose, Bld 104 (*)    Calcium 8.8 (*)    All other components within normal limits  HEPATIC FUNCTION PANEL - Abnormal; Notable for the following:    AST 61 (*)    All other components within normal limits  RAPID INFLUENZA A&B ANTIGENS ([facility] ONLY)   ____________________________________________  EKG  ED ECG REPORT I, Eula Listen, the attending physician, personally viewed and interpreted this ECG.   Date: 07/31/2015  EKG Time: 1600  Rate: 77  Rhythm: normal sinus rhythm  Axis: leftward  Intervals:first-degree A-V block   ST&T Change: No ST elevation.  ____________________________________________  RADIOLOGY  No results found.  ____________________________________________   PROCEDURES  Procedure(s) performed: None  Critical Care performed: No ____________________________________________   INITIAL IMPRESSION / ASSESSMENT AND PLAN / ED COURSE  Pertinent labs & imaging results that were available during my care of the patient were reviewed by me and considered in my medical decision making (see chart for details).  70 y.o. male with a self-limited nausea vomiting and diarrhea, as well as an episode of shortness of breath associated with a large oral intake prior to the shortness of breath and completely resolving after vomiting.  Overall, the patient is well-appearing and his vital signs are stable. He has not had any fever here, or hypoxia. His influenza test is  negative. I will get a chest x-ray, basic labs, and an EKG. If he continues to be asymptomatic and his workup is reassuring, I will plan to discharge him home. He will have close PMD follow-up. We discussed return precautions as well as follow-up instructions.  ----------------------------------------- 4:28 PM on 07/31/2015 -----------------------------------------  The patient has reassuring labs except for a mild hyponatremia. Now that he is able to tolerate by mouth, think he will be able to correct this and his creatinine today is normal. He can follow up with his primary care physician to have it rechecked tomorrow. Influenza test is negative. He does have an elevated white blood cell count, which is likely a reactive marker of his acute nausea vomiting and diarrheal illness. I am awaiting the results of his chest x-ray, but if this is reassuring, I will plan to send the patient home as he is currently symptom-free. Again, he will be given abdominal as well as cardiac and respiratory return precautions, and instructions to follow up with his PMD tomorrow. ____________________________________________  FINAL CLINICAL IMPRESSION(S) / ED DIAGNOSES  Final diagnoses:  Hyponatremia  Nausea vomiting and diarrhea  Shortness of breath      NEW MEDICATIONS STARTED DURING THIS VISIT:  New Prescriptions   LOPERAMIDE (IMODIUM A-D) 2 MG TABLET    Take 1 tablet (2 mg total) by mouth 4 (four) times daily as needed for diarrhea or loose stools.   ONDANSETRON (ZOFRAN ODT) 4 MG DISINTEGRATING TABLET    Take 1 tablet (4 mg total) by mouth every 8 (eight) hours as needed for nausea or vomiting.     Eula Listen, MD 07/31/15 1630

## 2015-07-31 NOTE — Discharge Instructions (Signed)
Please take a clear liquid diet for the next 24-48 hours, then advance to a bland BRAT diet as tolerated.  Please make an appointment to see your primary care physician tomorrow. Your primary care physician will need to recheck your sodium level, and reevaluate you to make sure that you are feeling better.  Return to the emergency department if your develop severe pain, shortness of breath, lightheadedness or fainting, fever, or any other symptoms concerning to you.

## 2015-07-31 NOTE — Assessment & Plan Note (Signed)
mirapex working well.  Follow.

## 2015-07-31 NOTE — Assessment & Plan Note (Signed)
Breathing stable.  On advair.  Follow.

## 2015-07-31 NOTE — Assessment & Plan Note (Signed)
On omeprazole.  Follow.  Upper symptoms controlled.

## 2015-07-31 NOTE — Assessment & Plan Note (Signed)
Using CPAP 

## 2015-08-01 ENCOUNTER — Telehealth: Payer: Self-pay | Admitting: Internal Medicine

## 2015-08-01 NOTE — Telephone Encounter (Signed)
Spoke with Patient, diarrhea remains has had two BM's today, coughing is hurting his chest, but non productive and no SOB today. No vomiting, tolerating solid foods today, 98.5 temperature.  Urinating okay.  Patient  Was told he needed to follow up today with his PCP.  Please advise.

## 2015-08-01 NOTE — Telephone Encounter (Signed)
Pt called regarding going to the ED yesterday. Dx for SOB,diarrhea,vomiting,weakness. Pt was told he does not have the flu. Pt needs a appt to see Dr Nicki Reaper. Pt does not want to see no other provider. I advised pt that we have other providers that can see him tomorrow pt refused. Call pt @ [phone]. Thank you!

## 2015-08-01 NOTE — Telephone Encounter (Signed)
See below

## 2015-08-01 NOTE — Telephone Encounter (Signed)
Need to know how he is doing now.  What symptoms is he having now?  Eating?  Any further vomiting, diarrhea, sob, etc?

## 2015-08-01 NOTE — Telephone Encounter (Signed)
I can work him in the spot tomorrow.  I gave you the information.  See if ok with tomorrow appt.

## 2015-08-01 NOTE — Telephone Encounter (Signed)
ED note in chart for viewing.  Please advise a place on the schedule as he is requesting a follow up with only you. thanks

## 2015-08-02 ENCOUNTER — Encounter: Payer: Self-pay | Admitting: Internal Medicine

## 2015-08-02 ENCOUNTER — Ambulatory Visit
Admission: RE | Admit: 2015-08-02 | Discharge: 2015-08-02 | Disposition: A | Discharge status: Home / Self Care | Payer: Medicare Other | Source: Ambulatory Visit | Attending: Internal Medicine | Admitting: Internal Medicine

## 2015-08-02 ENCOUNTER — Ambulatory Visit (INDEPENDENT_AMBULATORY_CARE_PROVIDER_SITE_OTHER): Payer: Medicare Other | Admitting: Internal Medicine

## 2015-08-02 VITALS — BP 110/70 | HR 73 | Temp 99.0°F | Resp 18 | Ht 68.0 in | Wt 229.5 lb

## 2015-08-02 DIAGNOSIS — D72829 Elevated white blood cell count, unspecified: Secondary | ICD-10-CM | POA: Insufficient documentation

## 2015-08-02 DIAGNOSIS — R7989 Other specified abnormal findings of blood chemistry: Secondary | ICD-10-CM

## 2015-08-02 DIAGNOSIS — E871 Hypo-osmolality and hyponatremia: Secondary | ICD-10-CM

## 2015-08-02 DIAGNOSIS — R197 Diarrhea, unspecified: Secondary | ICD-10-CM

## 2015-08-02 DIAGNOSIS — R945 Abnormal results of liver function studies: Secondary | ICD-10-CM

## 2015-08-02 DIAGNOSIS — R109 Unspecified abdominal pain: Secondary | ICD-10-CM | POA: Diagnosis not present

## 2015-08-02 DIAGNOSIS — K219 Gastro-esophageal reflux disease without esophagitis: Secondary | ICD-10-CM | POA: Diagnosis not present

## 2015-08-02 DIAGNOSIS — R112 Nausea with vomiting, unspecified: Secondary | ICD-10-CM | POA: Diagnosis not present

## 2015-08-02 LAB — CBC WITH DIFFERENTIAL/PLATELET
BASOS ABS: 0 10*3/uL (ref 0.0–0.1)
Basophils Relative: 0 % (ref 0.0–3.0)
EOS ABS: 0.2 10*3/uL (ref 0.0–0.7)
Eosinophils Relative: 1.3 % (ref 0.0–5.0)
HEMATOCRIT: 44.1 % (ref 39.0–52.0)
Hemoglobin: 14.8 g/dL (ref 13.0–17.0)
Lymphs Abs: 0.7 10*3/uL (ref 0.7–4.0)
MCHC: 33.6 g/dL (ref 30.0–36.0)
MCV: 93.3 fl (ref 78.0–100.0)
MONO ABS: 1.1 10*3/uL — AB (ref 0.1–1.0)
Monocytes Relative: 8.7 % (ref 3.0–12.0)
NEUTROS ABS: 10.9 10*3/uL — AB (ref 1.4–7.7)
Neutrophils Relative %: 84.8 % — ABNORMAL HIGH (ref 43.0–77.0)
PLATELETS: 189 10*3/uL (ref 150.0–400.0)
RBC: 4.72 Mil/uL (ref 4.22–5.81)
RDW: 13.7 % (ref 11.5–15.5)
WBC: 12.8 10*3/uL — AB (ref 4.0–10.5)

## 2015-08-02 LAB — BASIC METABOLIC PANEL
BUN: 12 mg/dL (ref 6–23)
CHLORIDE: 100 meq/L (ref 96–112)
CO2: 31 mEq/L (ref 19–32)
Calcium: 8.7 mg/dL (ref 8.4–10.5)
Creatinine, Ser: 1.16 mg/dL (ref 0.40–1.50)
GFR: 66.44 mL/min (ref >60.00)
Glucose, Bld: 91 mg/dL (ref 70–99)
POTASSIUM: 3.3 meq/L — AB (ref 3.5–5.1)
Sodium: 136 mEq/L (ref 135–145)

## 2015-08-02 LAB — HEPATIC FUNCTION PANEL
ALT: 107 U/L — ABNORMAL HIGH (ref 0–53)
AST: 136 U/L — ABNORMAL HIGH (ref 0–37)
Albumin: 3.8 g/dL (ref 3.5–5.2)
Alkaline Phosphatase: 62 U/L (ref 39–117)
BILIRUBIN DIRECT: 0.3 mg/dL (ref 0.0–0.3)
BILIRUBIN TOTAL: 1 mg/dL (ref 0.2–1.2)
TOTAL PROTEIN: 7 g/dL (ref 6.0–8.3)

## 2015-08-02 NOTE — Progress Notes (Signed)
Pre-visit discussion using our clinic review tool. No additional management support is needed unless otherwise documented below in the visit note.  

## 2015-08-02 NOTE — Progress Notes (Signed)
Patient ID: Joseph Glimpse., male   DOB: 1945/07/03, 70 y.o.   MRN: [id number]   Subjective:    Patient ID: Joseph Glimpse., male    DOB: 1945/05/31, 70 y.o.   MRN: [id number]  HPI  Patient here for ER follow up.  Was seen in ER 07/31/15.  Was evaluated for nausea, vomiting and sob.  See ER notes.  Reports that this started a few days ago with him feeling uncomfortable.  Then developed nausea and vomiting.  Had three episodes of emesis - small amount.  Drank gatorade.  No nausea or vomiting now.  Reports persistent diarrhea.  Multiple stools.  Belching and gas.  Some right side pain previously with increased cough.  Cough and sob resolved.  Mainly presents with persistent diarrhea.  Is better.     Past Medical History  Diagnosis Date  . Asthma   . Hypercholesterolemia   . Bladder outlet obstruction   . GERD (gastroesophageal reflux disease)     schatzki ring  . Skin cancer, basal cell   . Crohn's disease ([place])     appendix, s/p appendectomy  . Restless leg syndrome   . Seizure disorder [facility])     age 13, previously on phenobarbital  . Sleep apnea    Past Surgical History  Procedure Laterality Date  . Tonsillectomy and adenoidectomy  1956  . Nasal polyp surgery  1963  . Knee arthroscopy  1982  . Appendectomy  1992    lysis of adhesions (bowel blockage)  . Inguinal hernia surgery  1999    left   Family History  Problem Relation Age of Onset  . Heart disease Father   . Congestive Heart Failure Mother   . Colon cancer Neg Hx   . Prostate cancer Neg Hx    Social History   Social History  . Marital Status: Married    Spouse Name: N/A  . Number of Children: 0  . Years of Education: N/A   Social History Main Topics  . Smoking status: Never Smoker   . Smokeless tobacco: Never Used  . Alcohol Use: No  . Drug Use: No  . Sexual Activity: Not Asked   Other Topics Concern  . None   Social History Narrative    Outpatient Encounter Prescriptions as of 08/02/2015    Medication Sig  . albuterol (PROVENTIL HFA;VENTOLIN HFA) 108 (90 BASE) MCG/ACT inhaler Inhale 2 puffs into the lungs every 6 (six) hours as needed for wheezing or shortness of breath.  . calcium gluconate 500 MG tablet Take 500 mg by mouth 2 (two) times daily.  . Cholecalciferol (VITAMIN D-3) 1000 UNITS CAPS Take 1 capsule by mouth daily.  . finasteride (PROSCAR) 5 MG tablet Take 5 mg by mouth daily.  . Fluticasone-Salmeterol (ADVAIR DISKUS) 100-50 MCG/DOSE AEPB INHALE 1 PUFF EVERY DAY  . Magnesium 250 MG TABS Two tablets q day  . omeprazole (PRILOSEC) 20 MG capsule Take 20 mg by mouth daily.  . pramipexole (MIRAPEX) 0.25 MG tablet TAKE 1 AND 1/2 TABLETS DAILY  . pravastatin (PRAVACHOL) 10 MG tablet TAKE 1 TABLET (10 MG TOTAL) BY MOUTH DAILY.  . [DISCONTINUED] ondansetron (ZOFRAN ODT) 4 MG disintegrating tablet Take 1 tablet (4 mg total) by mouth every 8 (eight) hours as needed for nausea or vomiting.  . loperamide (IMODIUM A-D) 2 MG tablet Take 1 tablet (2 mg total) by mouth 4 (four) times daily as needed for diarrhea or loose stools. (Patient not taking: Reported on 08/02/2015)  No facility-administered encounter medications on file as of 08/02/2015.    Review of Systems  Constitutional: Negative for fever and unexpected weight change.  HENT: Negative for congestion and sinus pressure.   Respiratory: Negative for cough (no cough now. ), chest tightness and shortness of breath (no sob now. ).   Cardiovascular: Negative for chest pain and leg swelling.  Gastrointestinal: Positive for diarrhea.       Previous RUQ pain.  No vomiting now.  No nausea now.    Genitourinary: Negative for dysuria and difficulty urinating.  Musculoskeletal: Negative for back pain and joint swelling.  Skin: Negative for color change and rash.  Neurological: Negative for dizziness, light-headedness and headaches.  Psychiatric/Behavioral: Negative for dysphoric mood and agitation.       Objective:    Physical  Exam  Constitutional: He appears well-developed and well-nourished. No distress.  Cardiovascular: Normal rate and regular rhythm.   Pulmonary/Chest: Effort normal and breath sounds normal. No respiratory distress.  Abdominal: Soft. Bowel sounds are normal.  No significant abdominal pain.   Musculoskeletal: He exhibits no edema or tenderness.  Skin: No rash noted. No erythema.  Psychiatric: He has a normal mood and affect. His behavior is normal.    BP 110/70 mmHg  Pulse 73  Temp(Src) 99 F (37.2 C) (Oral)  Resp 18  Ht 5\' 8"  (1.727 m)  Wt 229 lb 8 oz (104.101 kg)  BMI 34.90 kg/m2  SpO2 95% Wt Readings from Last 3 Encounters:  08/02/15 229 lb 8 oz (104.101 kg)  07/31/15 225 lb (102.059 kg)  07/28/15 235 lb 6 oz (106.765 kg)     Lab Results  Component Value Date   WBC 12.8* 08/02/2015   HGB 14.8 08/02/2015   HCT 44.1 08/02/2015   PLT 189.0 08/02/2015   GLUCOSE 82 08/05/2015   CHOL 149 08/05/2015   TRIG 99.0 08/05/2015   HDL 34.50* 08/05/2015   LDLDIRECT 154.7 05/22/2013   LDLCALC 94 08/05/2015   ALT 84* 08/05/2015   ALT 84* 08/05/2015   AST 87* 08/05/2015   AST 87* 08/05/2015   NA 138 08/05/2015   K 3.5 08/05/2015   K 3.5 08/05/2015   CL 97 08/05/2015   CREATININE 0.97 08/05/2015   BUN 14 08/05/2015   CO2 34* 08/05/2015   TSH 2.00 02/11/2015    Dg Chest 2 View  07/31/2015  CLINICAL DATA:  Nausea, vomiting and diarrhea with fever and cough. EXAM: CHEST  2 VIEW COMPARISON:  From 01/31/2015 FINDINGS: Lungs are adequately inflated without consolidation or effusion. For cardiomediastinal silhouette is within normal. There mild degenerate changes of the spine. IMPRESSION: No active cardiopulmonary disease. Electronically Signed   By: Marin Olp M.D.   On: 07/31/2015 16:26       Assessment & Plan:   Problem List Items Addressed This Visit    Abnormal liver function test    Recheck liver panel today.       Diarrhea - Primary    Will check stool studies.  Start  probiotic.  Had recent episode of persistent diarrhea and found to have salmonella.  Check abdominal xray.  May be viral gastroenteritis.  Stay hydrated.  Bland foods.  Call with update.        Relevant Orders   Hepatic function panel (Completed)   DG Abd 1 View (Completed)   C. difficile GDH and Toxin A/B (Completed)   Stool Culture   Ova and Parasite Examination   GERD (gastroesophageal reflux disease)  Increase prilosec to bid for now.  Follow.        Hyponatremia    Recheck sodium today.  Eating.  Diarrhea is better.        Relevant Orders   Basic metabolic panel (Completed)   DG Abd 1 View (Completed)   C. difficile GDH and Toxin A/B (Completed)   Stool Culture   Ova and Parasite Examination   Leukocytosis    Found in ER.  Recheck cbc today.        Relevant Orders   CBC with Differential/Platelet (Completed)   DG Abd 1 View (Completed)   C. difficile GDH and Toxin A/B (Completed)   Stool Culture   Ova and Parasite Examination       Einar Pheasant, MD

## 2015-08-03 ENCOUNTER — Other Ambulatory Visit: Payer: Self-pay | Admitting: Internal Medicine

## 2015-08-03 ENCOUNTER — Encounter: Payer: Self-pay | Admitting: Internal Medicine

## 2015-08-03 ENCOUNTER — Encounter: Payer: Self-pay | Admitting: *Deleted

## 2015-08-03 DIAGNOSIS — R7989 Other specified abnormal findings of blood chemistry: Secondary | ICD-10-CM

## 2015-08-03 DIAGNOSIS — E871 Hypo-osmolality and hyponatremia: Secondary | ICD-10-CM | POA: Diagnosis not present

## 2015-08-03 DIAGNOSIS — R197 Diarrhea, unspecified: Secondary | ICD-10-CM | POA: Diagnosis not present

## 2015-08-03 DIAGNOSIS — R110 Nausea: Secondary | ICD-10-CM

## 2015-08-03 DIAGNOSIS — R945 Abnormal results of liver function studies: Secondary | ICD-10-CM

## 2015-08-03 DIAGNOSIS — E875 Hyperkalemia: Secondary | ICD-10-CM

## 2015-08-03 DIAGNOSIS — D72829 Elevated white blood cell count, unspecified: Secondary | ICD-10-CM | POA: Diagnosis not present

## 2015-08-03 NOTE — Progress Notes (Signed)
Order placed for labs.

## 2015-08-04 LAB — C. DIFFICILE GDH AND TOXIN A/B
C. DIFF TOXIN A/B: NOT DETECTED
C. difficile GDH: NOT DETECTED

## 2015-08-04 NOTE — Telephone Encounter (Signed)
Order placed for abdominal ultrasound.   

## 2015-08-05 ENCOUNTER — Other Ambulatory Visit (INDEPENDENT_AMBULATORY_CARE_PROVIDER_SITE_OTHER): Payer: Medicare Other

## 2015-08-05 DIAGNOSIS — E7800 Pure hypercholesterolemia, unspecified: Secondary | ICD-10-CM

## 2015-08-05 DIAGNOSIS — R7989 Other specified abnormal findings of blood chemistry: Secondary | ICD-10-CM

## 2015-08-05 DIAGNOSIS — E875 Hyperkalemia: Secondary | ICD-10-CM

## 2015-08-05 DIAGNOSIS — R945 Abnormal results of liver function studies: Secondary | ICD-10-CM

## 2015-08-05 LAB — COMPREHENSIVE METABOLIC PANEL
ALT: 84 U/L — AB (ref 0–53)
AST: 87 U/L — ABNORMAL HIGH (ref 0–37)
Albumin: 3.9 g/dL (ref 3.5–5.2)
Alkaline Phosphatase: 89 U/L (ref 39–117)
BUN: 14 mg/dL (ref 6–23)
CALCIUM: 9.1 mg/dL (ref 8.4–10.5)
CO2: 34 mEq/L — ABNORMAL HIGH (ref 19–32)
Chloride: 97 mEq/L (ref 96–112)
Creatinine, Ser: 0.97 mg/dL (ref 0.40–1.50)
GFR: 81.67 mL/min (ref >60.00)
Glucose, Bld: 82 mg/dL (ref 70–99)
POTASSIUM: 3.5 meq/L (ref 3.5–5.1)
Sodium: 138 mEq/L (ref 135–145)
TOTAL PROTEIN: 6.7 g/dL (ref 6.0–8.3)
Total Bilirubin: 0.8 mg/dL (ref 0.2–1.2)

## 2015-08-05 LAB — HEPATIC FUNCTION PANEL
ALK PHOS: 89 U/L (ref 39–117)
ALT: 84 U/L — AB (ref 0–53)
AST: 87 U/L — AB (ref 0–37)
Albumin: 3.9 g/dL (ref 3.5–5.2)
BILIRUBIN DIRECT: 0.2 mg/dL (ref 0.0–0.3)
BILIRUBIN TOTAL: 0.8 mg/dL (ref 0.2–1.2)
Total Protein: 6.7 g/dL (ref 6.0–8.3)

## 2015-08-05 LAB — LIPID PANEL
Cholesterol: 149 mg/dL (ref 0–200)
HDL: 34.5 mg/dL — AB (ref >39.00)
LDL Cholesterol: 94 mg/dL (ref 0–99)
NonHDL: 114.04
TRIGLYCERIDES: 99 mg/dL (ref 0.0–149.0)
Total CHOL/HDL Ratio: 4
VLDL: 19.8 mg/dL (ref 0.0–40.0)

## 2015-08-05 LAB — POTASSIUM: POTASSIUM: 3.5 meq/L (ref 3.5–5.1)

## 2015-08-06 ENCOUNTER — Encounter: Payer: Self-pay | Admitting: Internal Medicine

## 2015-08-07 ENCOUNTER — Encounter: Payer: Self-pay | Admitting: Internal Medicine

## 2015-08-07 NOTE — Assessment & Plan Note (Signed)
Increase prilosec to bid for now.  Follow.

## 2015-08-07 NOTE — Assessment & Plan Note (Signed)
Recheck liver panel today.  

## 2015-08-07 NOTE — Assessment & Plan Note (Signed)
Found in ER.  Recheck cbc today.

## 2015-08-07 NOTE — Assessment & Plan Note (Signed)
Recheck sodium today.  Eating.  Diarrhea is better.

## 2015-08-07 NOTE — Assessment & Plan Note (Signed)
Will check stool studies.  Start probiotic.  Had recent episode of persistent diarrhea and found to have salmonella.  Check abdominal xray.  May be viral gastroenteritis.  Stay hydrated.  Bland foods.  Call with update.

## 2015-08-10 ENCOUNTER — Other Ambulatory Visit (INDEPENDENT_AMBULATORY_CARE_PROVIDER_SITE_OTHER): Payer: Medicare Other

## 2015-08-10 ENCOUNTER — Encounter: Payer: Self-pay | Admitting: *Deleted

## 2015-08-10 DIAGNOSIS — E876 Hypokalemia: Secondary | ICD-10-CM | POA: Diagnosis not present

## 2015-08-11 ENCOUNTER — Other Ambulatory Visit (INDEPENDENT_AMBULATORY_CARE_PROVIDER_SITE_OTHER): Payer: Medicare Other

## 2015-08-11 ENCOUNTER — Other Ambulatory Visit: Payer: Federal, State, Local not specified - PPO

## 2015-08-11 ENCOUNTER — Telehealth: Payer: Self-pay | Admitting: *Deleted

## 2015-08-11 ENCOUNTER — Other Ambulatory Visit: Payer: Self-pay | Admitting: Internal Medicine

## 2015-08-11 ENCOUNTER — Encounter: Payer: Self-pay | Admitting: Internal Medicine

## 2015-08-11 DIAGNOSIS — R7989 Other specified abnormal findings of blood chemistry: Secondary | ICD-10-CM

## 2015-08-11 DIAGNOSIS — R945 Abnormal results of liver function studies: Secondary | ICD-10-CM

## 2015-08-11 LAB — HEPATIC FUNCTION PANEL
ALK PHOS: 69 U/L (ref 39–117)
ALT: 44 U/L (ref 0–53)
AST: 60 U/L — ABNORMAL HIGH (ref 0–37)
Albumin: 3.8 g/dL (ref 3.5–5.2)
BILIRUBIN DIRECT: 0.1 mg/dL (ref 0.0–0.3)
TOTAL PROTEIN: 6.6 g/dL (ref 6.0–8.3)
Total Bilirubin: 0.4 mg/dL (ref 0.2–1.2)

## 2015-08-11 LAB — POTASSIUM: Potassium: 3.8 mEq/L (ref 3.5–5.1)

## 2015-08-11 NOTE — Telephone Encounter (Signed)
Add on request faxed.

## 2015-08-11 NOTE — Telephone Encounter (Signed)
-----   Message from Einar Pheasant, MD sent at 08/11/2015  1:24 PM EDT ----- Regarding: add on lab Please add liver panel to blood drawn.  Thanks.   Dr Nicki Reaper

## 2015-08-11 NOTE — Progress Notes (Signed)
Order placed for liver panel.  

## 2015-08-12 ENCOUNTER — Ambulatory Visit
Admission: RE | Admit: 2015-08-12 | Discharge: 2015-08-12 | Disposition: A | Discharge status: Home / Self Care | Payer: Medicare Other | Source: Ambulatory Visit | Attending: Internal Medicine | Admitting: Internal Medicine

## 2015-08-12 DIAGNOSIS — K8020 Calculus of gallbladder without cholecystitis without obstruction: Secondary | ICD-10-CM | POA: Diagnosis not present

## 2015-08-12 DIAGNOSIS — R197 Diarrhea, unspecified: Secondary | ICD-10-CM | POA: Insufficient documentation

## 2015-08-12 DIAGNOSIS — R110 Nausea: Secondary | ICD-10-CM | POA: Insufficient documentation

## 2015-08-12 DIAGNOSIS — R945 Abnormal results of liver function studies: Secondary | ICD-10-CM

## 2015-08-12 DIAGNOSIS — R7989 Other specified abnormal findings of blood chemistry: Secondary | ICD-10-CM | POA: Insufficient documentation

## 2015-08-13 ENCOUNTER — Encounter: Payer: Self-pay | Admitting: Internal Medicine

## 2015-08-15 ENCOUNTER — Encounter: Payer: Self-pay | Admitting: Internal Medicine

## 2015-09-14 ENCOUNTER — Ambulatory Visit (INDEPENDENT_AMBULATORY_CARE_PROVIDER_SITE_OTHER): Payer: Medicare Other

## 2015-09-14 VITALS — BP 110/70 | HR 71 | Temp 97.1°F | Resp 14 | Ht 68.0 in | Wt 237.4 lb

## 2015-09-14 DIAGNOSIS — Z0000 Encounter for general adult medical examination without abnormal findings: Secondary | ICD-10-CM

## 2015-09-14 NOTE — Progress Notes (Addendum)
Subjective:   Joseph Good. is a 70 y.o. male who presents for an Initial Medicare Annual Wellness Visit.  Review of Systems   Cardiac Risk Factors include: advanced age (>37men, >78 women);male gender    Objective:    Today's Vitals   09/14/15 1532  BP: 110/70  Pulse: 71  Temp: 97.1 F (36.2 C)  TempSrc: Oral  Resp: 14  Height: 5\' 8"  (1.727 m)  Weight: 237 lb 6.4 oz (107.684 kg)  SpO2: 96%   Body mass index is 36.1 kg/(m^2).  Current Medications (verified) Outpatient Encounter Prescriptions as of 09/14/2015  Medication Sig  . albuterol (PROVENTIL HFA;VENTOLIN HFA) 108 (90 BASE) MCG/ACT inhaler Inhale 2 puffs into the lungs every 6 (six) hours as needed for wheezing or shortness of breath.  . calcium gluconate 500 MG tablet Take 500 mg by mouth 2 (two) times daily.  . Cholecalciferol (VITAMIN D-3) 1000 UNITS CAPS Take 1 capsule by mouth daily.  . finasteride (PROSCAR) 5 MG tablet Take 5 mg by mouth daily.  . Fluticasone-Salmeterol (ADVAIR DISKUS) 100-50 MCG/DOSE AEPB INHALE 1 PUFF EVERY DAY  . Magnesium 250 MG TABS Two tablets q day  . omeprazole (PRILOSEC) 20 MG capsule Take 20 mg by mouth daily.  . pramipexole (MIRAPEX) 0.25 MG tablet TAKE 1 AND 1/2 TABLETS DAILY  . pravastatin (PRAVACHOL) 10 MG tablet TAKE 1 TABLET (10 MG TOTAL) BY MOUTH DAILY.  . [DISCONTINUED] loperamide (IMODIUM A-D) 2 MG tablet Take 1 tablet (2 mg total) by mouth 4 (four) times daily as needed for diarrhea or loose stools.   No facility-administered encounter medications on file as of 09/14/2015.    Allergies (verified) Seldane   History: Past Medical History  Diagnosis Date  . Asthma   . Hypercholesterolemia   . Bladder outlet obstruction   . GERD (gastroesophageal reflux disease)     schatzki ring  . Skin cancer, basal cell   . Crohn's disease ([place])     appendix, s/p appendectomy  . Restless leg syndrome   . Seizure disorder [facility]     age 21, previously on phenobarbital  .  Sleep apnea    Past Surgical History  Procedure Laterality Date  . Tonsillectomy and adenoidectomy  1956  . Nasal polyp surgery  1963  . Knee arthroscopy  1982  . Appendectomy  1992    lysis of adhesions (bowel blockage)  . Inguinal hernia surgery  1999    left   Family History  Problem Relation Age of Onset  . Heart disease Father   . Congestive Heart Failure Mother   . Colon cancer Neg Hx   . Prostate cancer Neg Hx    Social History   Occupational History  . Not on file.   Social History Main Topics  . Smoking status: Never Smoker   . Smokeless tobacco: Never Used  . Alcohol Use: No  . Drug Use: No  . Sexual Activity: Not Currently   Tobacco Counseling Counseling given: Not Answered   Activities of Daily Living In your present state of health, do you have any difficulty performing the following activities: 09/14/2015  Hearing? Y  Vision? N  Difficulty concentrating or making decisions? N  Walking or climbing stairs? Y  Dressing or bathing? N  Doing errands, shopping? N  Preparing Food and eating ? N  Using the Toilet? N  In the past six months, have you accidently leaked urine? N  Do you have problems with loss of bowel control?  N  Managing your Medications? N  Managing your Finances? N  Housekeeping or managing your Housekeeping? N    Immunizations and Health Maintenance Immunization History  Administered Date(s) Administered  . Influenza Split 03/02/2013  . Influenza,inj,Quad PF,36+ Mos 01/22/2014, 01/31/2015  . Pneumococcal Conjugate-13 07/22/2013  . Pneumococcal Polysaccharide-23 02/11/2015   Health Maintenance Due  Topic Date Due  . Hepatitis C Screening  April 11, 1946    Patient Care Team: Einar Pheasant, MD as PCP - General (Internal Medicine)  Indicate any recent Medical Services you may have received from other than [facility] providers in the past year (date may be approximate).    Assessment:   This is a routine wellness examination for  Joseph Good. The goal of the wellness visit is to assist the patient how to close the gaps in care and create a preventative care plan for the patient.   Taking Calcium VIT D3 as appropriate/Osteoporosis reviewed.   Medications reviewed; taking without issues or barriers.  Safety issues reviewed; smoke and carbon monoxide detectors in the home. Firearms are locked in a secure place in the home. Wears seatbelts when driving or riding with others. No violence in the home.  No identified risk were noted; The patient was oriented x 3; appropriate in dress and manner and no objective failures at ADL's or IADL's.   TDAP and ZOSTAVAX vaccine postponed, per patient request.    Patient Concerns:  Continuing to take Prilosec 20mg  and work towards decreasing dosage.  Encouraged to contact PCP prior to decreasing.  Follow up with PCP as needed.   Hearing/Vision screen Hearing Screening Comments: Followed by Dr. Tami Ribas Some difficulty hearing Auditory deferred at this time Educational material provided Vision Screening Comments: Followed by [facility] Bilateral cataracts extracted Wears glasses Last OV 2016  Dietary issues and exercise activities discussed: Current Exercise Habits: Home exercise routine, Type of exercise: walking, Time (Minutes): 20, Frequency (Times/Week): 2, Weekly Exercise (Minutes/Week): 40, Intensity: Mild  Goals    . Healthy Lifestyle     Stay hydrated and drink plenty of fluids.  Increase water intake by 1 bottle (=2cups). Low carb foods.  Lean meats and vegetables. Stay active and continue to walk for exercise.  Begin walking around the neighborhood 3 times a day, 4 days a week, increase as tolerated.      Depression Screen PHQ 2/9 Scores 09/14/2015 07/28/2015 07/29/2014 11/15/2012  PHQ - 2 Score 0 0 1 0    Fall Risk Fall Risk  09/14/2015 07/28/2015 07/29/2014 11/15/2012  Falls in the past year? No No No No    Cognitive Function: MMSE - Mini Mental  State Exam 09/14/2015  Orientation to time 5  Orientation to Place 5  Registration 3  Attention/ Calculation 5  Recall 3  Language- name 2 objects 2  Language- repeat 1  Language- follow 3 step command 3  Language- read & follow direction 1  Write a sentence 1  Copy design 1  Total score 30    Screening Tests Health Maintenance  Topic Date Due  . Hepatitis C Screening  [birth year]  . ZOSTAVAX  01/28/2017 (Originally 01/17/2007)  . TETANUS/TDAP  01/28/2017 (Originally 09/28/2013)  . INFLUENZA VACCINE  11/29/2015  . COLONOSCOPY  12/29/2020  . PNA vac Low Risk Adult  Completed        Plan:   End of life planning; Advance aging; Advanced directives discussed.  He is uncertain if  HCPOA/Living Will has been completed and would like to wait to speak to  wife about it.   Educational material deferred and will follow up with PCP.   During the course of the visit Joseph Good was educated and counseled about the following appropriate screening and preventive services:   Vaccines to include Pneumoccal, Influenza, Hepatitis B, Td, Zostavax, HCV  Electrocardiogram  Colorectal cancer screening  Cardiovascular disease screening  Diabetes screening  Glaucoma screening  Nutrition counseling  Prostate cancer screening  Smoking cessation counseling  Patient Instructions (the written plan) were given to the patient.   Varney Biles, LPN   [id number]     Reviewed above.  Agree with plan.    Dr Nicki Reaper

## 2015-09-14 NOTE — Patient Instructions (Addendum)
Mr. Joseph Good , Thank you for taking time to come for your Medicare Wellness Visit. I appreciate your ongoing commitment to your health goals. Please review the following plan we discussed and let me know if I can assist you in the future.   Follow up with Dr. Nicki Reaper as needed.   This is a list of the screening recommended for you and due dates:  Health Maintenance  Topic Date Due  .  Hepatitis C: One time screening is recommended by Center for Disease Control  (CDC) for  adults born from [birth year] through 1964.   [birth year]  . Shingles Vaccine  01/28/2017*  . Tetanus Vaccine  01/28/2017*  . Flu Shot  11/29/2015  . Colon Cancer Screening  12/29/2020  . Pneumonia vaccines  Completed  *Topic was postponed. The date shown is not the original due date.    Hearing Loss Hearing loss is a partial or total loss of the ability to hear. This can be temporary or permanent, and it can happen in one or both ears. Hearing loss may be referred to as deafness. Medical care is necessary to treat hearing loss properly and to prevent the condition from getting worse. Your hearing may partially or completely come back, depending on what caused your hearing loss and how severe it is. In some cases, hearing loss is permanent. CAUSES Common causes of hearing loss include:   Too much wax in the ear canal.   Infection of the ear canal or middle ear.   Fluid in the middle ear.   Injury to the ear or surrounding area.   An object stuck in the ear.   Prolonged exposure to loud sounds, such as music.  Less common causes of hearing loss include:   Tumors in the ear.   Viral or bacterial infections, such as meningitis.   A hole in the eardrum (perforated eardrum).  Problems with the hearing nerve that sends signals between the brain and the ear.  Certain medicines.  SYMPTOMS  Symptoms of this condition may include:  Difficulty telling the difference between sounds.  Difficulty following a  conversation when there is background noise.  Lack of response to sounds in your environment. This may be most noticeable when you do not respond to startling sounds.  Needing to turn up the volume on the television, radio, etc.  Ringing in the ears.  Dizziness.  Pain in the ears. DIAGNOSIS This condition is diagnosed based on a physical exam and a hearing test (audiometry). The audiometry test will be performed by a hearing specialist (audiologist). You may also be referred to an ear, nose, and throat (ENT) specialist (otolaryngologist).  TREATMENT Treatment for recent onset of hearing loss may include:   Ear wax removal.   Being prescribed medicines to prevent infection (antibiotics).   Being prescribed medicines to reduce inflammation (corticosteroids).  HOME CARE INSTRUCTIONS  If you were prescribed an antibiotic medicine, take it as told by your health care provider. Do not stop taking the antibiotic even if you start to feel better.  Take over-the-counter and prescription medicines only as told by your health care provider.  Avoid loud noises.   Return to your normal activities as told by your health care provider. Ask your health care provider what activities are safe for you.  Keep all follow-up visits as told by your health care provider. This is important. SEEK MEDICAL CARE IF:   You feel dizzy.   You develop new symptoms.   You vomit or  feel nauseous.   You have a fever.  SEEK IMMEDIATE MEDICAL CARE IF:  You develop sudden changes in your vision.   You have severe ear pain.   You have new or increased weakness.  You have a severe headache.   This information is not intended to replace advice given to you by your health care provider. Make sure you discuss any questions you have with your health care provider.   Document Released: 04/16/2005 Document Revised: 01/05/2015 Document Reviewed: 09/01/2014 Elsevier Interactive Patient Education [employer]

## 2015-09-16 DIAGNOSIS — R339 Retention of urine, unspecified: Secondary | ICD-10-CM | POA: Diagnosis not present

## 2015-09-16 DIAGNOSIS — N401 Enlarged prostate with lower urinary tract symptoms: Secondary | ICD-10-CM | POA: Diagnosis not present

## 2015-09-16 DIAGNOSIS — N411 Chronic prostatitis: Secondary | ICD-10-CM | POA: Diagnosis not present

## 2015-09-16 DIAGNOSIS — R972 Elevated prostate specific antigen [PSA]: Secondary | ICD-10-CM | POA: Diagnosis not present

## 2015-10-05 DIAGNOSIS — R233 Spontaneous ecchymoses: Secondary | ICD-10-CM | POA: Diagnosis not present

## 2015-10-05 DIAGNOSIS — L570 Actinic keratosis: Secondary | ICD-10-CM | POA: Diagnosis not present

## 2015-10-05 DIAGNOSIS — Z08 Encounter for follow-up examination after completed treatment for malignant neoplasm: Secondary | ICD-10-CM | POA: Diagnosis not present

## 2015-10-05 DIAGNOSIS — Z1283 Encounter for screening for malignant neoplasm of skin: Secondary | ICD-10-CM | POA: Diagnosis not present

## 2015-10-05 DIAGNOSIS — Z85828 Personal history of other malignant neoplasm of skin: Secondary | ICD-10-CM | POA: Diagnosis not present

## 2015-10-05 DIAGNOSIS — L821 Other seborrheic keratosis: Secondary | ICD-10-CM | POA: Diagnosis not present

## 2015-11-25 ENCOUNTER — Encounter: Payer: Self-pay | Admitting: Internal Medicine

## 2015-11-25 ENCOUNTER — Telehealth: Payer: Self-pay | Admitting: *Deleted

## 2015-11-25 MED ORDER — FLUTICASONE-SALMETEROL 100-50 MCG/DOSE IN AEPB: INHALATION_SPRAY | RESPIRATORY_TRACT | 0 refills | Status: DC

## 2015-11-25 MED ORDER — FLUTICASONE-SALMETEROL 100-50 MCG/DOSE IN AEPB: INHALATION_SPRAY | RESPIRATORY_TRACT | 1 refills | Status: DC

## 2015-11-28 NOTE — Telephone Encounter (Signed)
Refilled medication

## 2016-01-11 DIAGNOSIS — L570 Actinic keratosis: Secondary | ICD-10-CM | POA: Diagnosis not present

## 2016-02-03 ENCOUNTER — Ambulatory Visit (INDEPENDENT_AMBULATORY_CARE_PROVIDER_SITE_OTHER): Payer: Medicare Other | Admitting: Internal Medicine

## 2016-02-03 ENCOUNTER — Encounter: Payer: Self-pay | Admitting: Internal Medicine

## 2016-02-03 VITALS — BP 122/70 | HR 60 | Temp 98.0°F | Ht 68.0 in | Wt 234.0 lb

## 2016-02-03 DIAGNOSIS — G4730 Sleep apnea, unspecified: Secondary | ICD-10-CM | POA: Diagnosis not present

## 2016-02-03 DIAGNOSIS — R945 Abnormal results of liver function studies: Secondary | ICD-10-CM

## 2016-02-03 DIAGNOSIS — E7800 Pure hypercholesterolemia, unspecified: Secondary | ICD-10-CM

## 2016-02-03 DIAGNOSIS — E559 Vitamin D deficiency, unspecified: Secondary | ICD-10-CM

## 2016-02-03 DIAGNOSIS — J4520 Mild intermittent asthma, uncomplicated: Secondary | ICD-10-CM

## 2016-02-03 DIAGNOSIS — Z23 Encounter for immunization: Secondary | ICD-10-CM

## 2016-02-03 DIAGNOSIS — R7989 Other specified abnormal findings of blood chemistry: Secondary | ICD-10-CM

## 2016-02-03 DIAGNOSIS — G2581 Restless legs syndrome: Secondary | ICD-10-CM

## 2016-02-03 DIAGNOSIS — K219 Gastro-esophageal reflux disease without esophagitis: Secondary | ICD-10-CM

## 2016-02-03 NOTE — Progress Notes (Addendum)
Patient ID: Joseph Good., male   DOB: 01-Feb-1946, 70 y.o.   MRN: [id number]   Subjective:    Patient ID: Joseph Good., male    DOB: 10/26/1945, 70 y.o.   MRN: [id number]  HPI  Patient here for a scheduled follow up.  He is accompanied by his wife.  History obtained from both of them.  He is doing well.  Feels good.  No chest pain.  No sob.  No acid reflux.  No abdominal pain or cramping.  Bowels stable.  Has been having some heel pain.  Hurts when he first gets out of bed.  Better after walking.  Just started using his heel cups.  This has helped in the past.  We discussed foot stretches.     Past Medical History:  Diagnosis Date  . Asthma   . Bladder outlet obstruction   . Crohn's disease ([place])    appendix, s/p appendectomy  . GERD (gastroesophageal reflux disease)    schatzki ring  . Hypercholesterolemia   . Restless leg syndrome   . Seizure disorder [facility])    age 68, previously on phenobarbital  . Skin cancer, basal cell   . Sleep apnea    Past Surgical History:  Procedure Laterality Date  . APPENDECTOMY  1992   lysis of adhesions (bowel blockage)  . inguinal hernia surgery  1999   left  . KNEE ARTHROSCOPY  1982  . [place]  . TONSILLECTOMY AND ADENOIDECTOMY  1956   Family History  Problem Relation Age of Onset  . Heart disease Father   . Congestive Heart Failure Mother   . Colon cancer Neg Hx   . Prostate cancer Neg Hx    Social History   Social History  . Marital status: Married    Spouse name: N/A  . Number of children: 0  . Years of education: N/A   Social History Main Topics  . Smoking status: Never Smoker  . Smokeless tobacco: Never Used  . Alcohol use No  . Drug use: No  . Sexual activity: Not Currently   Other Topics Concern  . None   Social History Narrative  . None    Outpatient Encounter Prescriptions as of 02/03/2016  Medication Sig  . albuterol (PROVENTIL HFA;VENTOLIN HFA) 108 (90 BASE) MCG/ACT inhaler Inhale  2 puffs into the lungs every 6 (six) hours as needed for wheezing or shortness of breath.  . calcium gluconate 500 MG tablet Take 500 mg by mouth 2 (two) times daily.  . Cholecalciferol (VITAMIN D-3) 1000 UNITS CAPS Take 1 capsule by mouth daily.  . finasteride (PROSCAR) 5 MG tablet Take 5 mg by mouth daily.  . Fluticasone-Salmeterol (ADVAIR DISKUS) 100-50 MCG/DOSE AEPB INHALE 1 PUFF EVERY DAY  . Magnesium 250 MG TABS Two tablets q day  . omeprazole (PRILOSEC) 20 MG capsule Take 20 mg by mouth daily.  . pramipexole (MIRAPEX) 0.25 MG tablet TAKE 1 AND 1/2 TABLETS DAILY  . pravastatin (PRAVACHOL) 10 MG tablet TAKE 1 TABLET (10 MG TOTAL) BY MOUTH DAILY.   No facility-administered encounter medications on file as of 02/03/2016.     Review of Systems  Constitutional: Negative for appetite change and unexpected weight change.  HENT: Negative for congestion and sinus pressure.   Respiratory: Negative for cough, chest tightness and shortness of breath.   Cardiovascular: Negative for chest pain, palpitations and leg swelling.  Gastrointestinal: Negative for abdominal pain, diarrhea, nausea and vomiting.  Genitourinary: Negative  for difficulty urinating and dysuria.  Musculoskeletal: Negative for back pain.       Heel pain as outlined.    Skin: Negative for color change and rash.  Neurological: Negative for dizziness, light-headedness and headaches.  Psychiatric/Behavioral: Negative for agitation and dysphoric mood.       Objective:    Physical Exam  Constitutional: He appears well-developed and well-nourished. No distress.  HENT:  Nose: Nose normal.  Mouth/Throat: Oropharynx is clear and moist.  Neck: Neck supple. No thyromegaly present.  Cardiovascular: Normal rate and regular rhythm.   Pulmonary/Chest: Effort normal and breath sounds normal. No respiratory distress.  Abdominal: Soft. Bowel sounds are normal. There is no tenderness.  Musculoskeletal: He exhibits no edema or tenderness.    Minimal tenderness to palpation over the heels.    Lymphadenopathy:    He has no cervical adenopathy.  Skin: No rash noted. No erythema.  Psychiatric: He has a normal mood and affect. His behavior is normal.    BP 122/70   Pulse 60   Temp 98 F (36.7 C) (Oral)   Ht 5\' 8"  (1.727 m)   Wt 234 lb (106.1 kg)   SpO2 95%   BMI 35.58 kg/m  Wt Readings from Last 3 Encounters:  02/03/16 234 lb (106.1 kg)  09/14/15 237 lb 6.4 oz (107.7 kg)  08/02/15 229 lb 8 oz (104.1 kg)     Lab Results  Component Value Date   WBC 12.8 (H) 08/02/2015   HGB 14.8 08/02/2015   HCT 44.1 08/02/2015   PLT 189.0 08/02/2015   GLUCOSE 82 08/05/2015   CHOL 149 08/05/2015   TRIG 99.0 08/05/2015   HDL 34.50 (L) 08/05/2015   LDLDIRECT 154.7 05/22/2013   LDLCALC 94 08/05/2015   ALT 44 08/11/2015   AST 60 (H) 08/11/2015   NA 138 08/05/2015   K 3.8 08/10/2015   CL 97 08/05/2015   CREATININE 0.97 08/05/2015   BUN 14 08/05/2015   CO2 34 (H) 08/05/2015   TSH 2.00 02/11/2015    US Abdomen Complete  Result Date: 08/12/2015 CLINICAL DATA:  Abnormal LFT. Nausea and vomiting and diarrhea. Right upper quadrant pain EXAM: ABDOMEN ULTRASOUND COMPLETE COMPARISON:  Ultrasound abdomen 09/27/2010 FINDINGS: Gallbladder: Multiple mobile gallstones. Largest gallstone 7 mm. Gallbladder wall thickness 2.2 mm. Nontender over the gallbladder during scanning. Common bile duct: Diameter: 3.8 mm Liver: 15 mm cyst left lobe of the liver. Liver echogenicity is within normal limits. No liver mass. IVC: No abnormality visualized. Pancreas: Visualized portion unremarkable. Spleen: Size and appearance within normal limits. Right Kidney: Length: 10.3 cm. Echogenicity within normal limits. No mass or hydronephrosis visualized. Left Kidney: Length: 11.6 cm. 5.9 cm cyst left lower pole. Echogenicity within normal limits. No mass or hydronephrosis visualized. Abdominal aorta: No aneurysm visualized. Other findings: None. IMPRESSION:  Cholelithiasis. No evidence of acute cholecystitis. Bile ducts nondilated. Electronically Signed   By: Franchot Gallo M.D.   On: 08/12/2015 10:12       Assessment & Plan:   Problem List Items Addressed This Visit    Abnormal liver function test    Have discussed diet, exercise and weight loss.  Follow liver function tests.       Relevant Orders   Hepatic function panel   Asthma    Breathing stable.  On advair.  Doing well.       GERD (gastroesophageal reflux disease)    On prilosec.  Follow.  Asymptomatic.        Hypercholesterolemia  Low cholesterol diet and exercise.  On pravastatin.  Follow lipid panel and liver function tests.  Discussed diet and exercise.        Relevant Orders   Basic metabolic panel   Lipid panel   TSH   Restless leg syndrome    Stable on mirapex.        Relevant Orders   CBC with Differential/Platelet   Sleep apnea    Using CPAP.        Other Visit Diagnoses    Vitamin D deficiency    -  Primary   Relevant Orders   VITAMIN D 25 Hydroxy (Vit-D Deficiency, Fractures)   Encounter for immunization       Relevant Orders   Flu vaccine HIGH DOSE PF (Completed)       Einar Pheasant, MD '

## 2016-02-03 NOTE — Progress Notes (Signed)
Pre visit review using our clinic review tool, if applicable. No additional management support is needed unless otherwise documented below in the visit note. 

## 2016-02-04 ENCOUNTER — Encounter: Payer: Self-pay | Admitting: Internal Medicine

## 2016-02-04 NOTE — Addendum Note (Signed)
Addended by: Alisa Graff on: 02/04/2016 09:29 PM   Modules accepted: Orders

## 2016-02-04 NOTE — Assessment & Plan Note (Signed)
On prilosec.  Follow.  Asymptomatic.

## 2016-02-04 NOTE — Assessment & Plan Note (Signed)
Breathing stable.  On advair.  Doing well.

## 2016-02-04 NOTE — Assessment & Plan Note (Signed)
Have discussed diet, exercise and weight loss.  Follow liver function tests.

## 2016-02-04 NOTE — Assessment & Plan Note (Signed)
Low cholesterol diet and exercise.  On pravastatin.  Follow lipid panel and liver function tests.  Discussed diet and exercise.

## 2016-02-04 NOTE — Assessment & Plan Note (Signed)
Using CPAP 

## 2016-02-04 NOTE — Assessment & Plan Note (Signed)
Stable on mirapex.  

## 2016-03-01 DIAGNOSIS — L570 Actinic keratosis: Secondary | ICD-10-CM | POA: Diagnosis not present

## 2016-03-01 DIAGNOSIS — L821 Other seborrheic keratosis: Secondary | ICD-10-CM | POA: Diagnosis not present

## 2016-03-01 DIAGNOSIS — L918 Other hypertrophic disorders of the skin: Secondary | ICD-10-CM | POA: Diagnosis not present

## 2016-03-09 ENCOUNTER — Other Ambulatory Visit: Payer: Federal, State, Local not specified - PPO

## 2016-03-15 ENCOUNTER — Other Ambulatory Visit (INDEPENDENT_AMBULATORY_CARE_PROVIDER_SITE_OTHER): Payer: Medicare Other

## 2016-03-15 DIAGNOSIS — G2581 Restless legs syndrome: Secondary | ICD-10-CM | POA: Diagnosis not present

## 2016-03-15 DIAGNOSIS — R7989 Other specified abnormal findings of blood chemistry: Secondary | ICD-10-CM

## 2016-03-15 DIAGNOSIS — E559 Vitamin D deficiency, unspecified: Secondary | ICD-10-CM

## 2016-03-15 DIAGNOSIS — R945 Abnormal results of liver function studies: Secondary | ICD-10-CM

## 2016-03-15 DIAGNOSIS — E7800 Pure hypercholesterolemia, unspecified: Secondary | ICD-10-CM | POA: Diagnosis not present

## 2016-03-15 LAB — BASIC METABOLIC PANEL
BUN: 15 mg/dL (ref 6–23)
CHLORIDE: 102 meq/L (ref 96–112)
CO2: 32 mEq/L (ref 19–32)
CREATININE: 1.15 mg/dL (ref 0.40–1.50)
Calcium: 9.5 mg/dL (ref 8.4–10.5)
GFR: 66.99 mL/min (ref >60.00)
GLUCOSE: 84 mg/dL (ref 70–99)
Potassium: 4.4 mEq/L (ref 3.5–5.1)
Sodium: 140 mEq/L (ref 135–145)

## 2016-03-15 LAB — CBC WITH DIFFERENTIAL/PLATELET
BASOS ABS: 0 10*3/uL (ref 0.0–0.1)
Basophils Relative: 0.6 % (ref 0.0–3.0)
EOS ABS: 0.1 10*3/uL (ref 0.0–0.7)
Eosinophils Relative: 2.1 % (ref 0.0–5.0)
HEMATOCRIT: 47.7 % (ref 39.0–52.0)
HEMOGLOBIN: 16.5 g/dL (ref 13.0–17.0)
LYMPHS PCT: 21.3 % (ref 12.0–46.0)
Lymphs Abs: 1.3 10*3/uL (ref 0.7–4.0)
MCHC: 34.6 g/dL (ref 30.0–36.0)
MCV: 93.3 fl (ref 78.0–100.0)
Monocytes Absolute: 0.6 10*3/uL (ref 0.1–1.0)
Monocytes Relative: 9.4 % (ref 3.0–12.0)
Neutro Abs: 4 10*3/uL (ref 1.4–7.7)
Neutrophils Relative %: 66.6 % (ref 43.0–77.0)
PLATELETS: 246 10*3/uL (ref 150.0–400.0)
RBC: 5.12 Mil/uL (ref 4.22–5.81)
RDW: 12.9 % (ref 11.5–15.5)
WBC: 6 10*3/uL (ref 4.0–10.5)

## 2016-03-15 LAB — HEPATIC FUNCTION PANEL
ALK PHOS: 58 U/L (ref 39–117)
ALT: 15 U/L (ref 0–53)
AST: 42 U/L — ABNORMAL HIGH (ref 0–37)
Albumin: 4.1 g/dL (ref 3.5–5.2)
BILIRUBIN DIRECT: 0.2 mg/dL (ref 0.0–0.3)
BILIRUBIN TOTAL: 0.7 mg/dL (ref 0.2–1.2)
TOTAL PROTEIN: 7.1 g/dL (ref 6.0–8.3)

## 2016-03-15 LAB — VITAMIN D 25 HYDROXY (VIT D DEFICIENCY, FRACTURES): VITD: 39.68 ng/mL (ref 30.00–100.00)

## 2016-03-15 LAB — LIPID PANEL
CHOLESTEROL: 172 mg/dL (ref 0–200)
HDL: 43.5 mg/dL (ref >39.00)
LDL CALC: 105 mg/dL — AB (ref 0–99)
NONHDL: 128.09
Total CHOL/HDL Ratio: 4
Triglycerides: 114 mg/dL (ref 0.0–149.0)
VLDL: 22.8 mg/dL (ref 0.0–40.0)

## 2016-03-15 LAB — TSH: TSH: 1.89 u[IU]/mL (ref 0.35–4.50)

## 2016-03-16 ENCOUNTER — Encounter: Payer: Self-pay | Admitting: Internal Medicine

## 2016-04-05 ENCOUNTER — Telehealth: Payer: Self-pay | Admitting: *Deleted

## 2016-04-05 DIAGNOSIS — J0100 Acute maxillary sinusitis, unspecified: Secondary | ICD-10-CM | POA: Diagnosis not present

## 2016-04-05 NOTE — Telephone Encounter (Signed)
Spoke with patient and he states since Sunday he has has been having  sinus congestion low grade fever , states he has been coughing up yellow phlegm.      Only using OTC Tylenol .   Patient declined appointment with other providers. Please advise.

## 2016-04-05 NOTE — Telephone Encounter (Signed)
Called patient back and spoke with him and advised him that since he was coughing up blood he needed to be seen today for work up.  Patient agreed to go to [place] walk in clinic for evaluation.  Will let us know tomorrow what he finds out.

## 2016-04-05 NOTE — Telephone Encounter (Signed)
Pt spouse called back stating that they were getting annoyed because they have not hear back from anyone. Pt is running a low grade fever, sinus congestion and is spitting up yellow phlegm with blood. Advised pt that Dr. Nicki Reaper did not have anything left for today and offered for another provider to see pt tomorrow. They would really prefer Dr. Nicki Reaper. Please advise, thank you!  Call pt @ [phone]

## 2016-04-05 NOTE — Telephone Encounter (Signed)
Patient requested to see Dr. Nicki Reaper only -pt wife stated that pt has low grade fever 99, he also has congestion  Please give a time and date to place pt  Pt contact [phone]

## 2016-04-05 NOTE — Telephone Encounter (Signed)
I can see him tomorrow at 1:00 for work in for this.  I am unable to work him in today.  If coughing up blood, needs evaluation today.

## 2016-04-06 NOTE — Telephone Encounter (Signed)
Please call pt for f/u and confirm doing ok.  If I need to see him today, let me know.

## 2016-04-06 NOTE — Telephone Encounter (Signed)
Was prescribed Amoxicillin by [facility] walk-in clinic .  Patient states he was able to rest a little better last night still has some blood in nasal mucus and cough.  Continuing to use OTC Robutussin and Mucinex. Told to call back if symptoms worsen.

## 2016-04-06 NOTE — Telephone Encounter (Signed)
Left voice mail for patient to call.

## 2016-05-01 IMAGING — US US ABDOMEN COMPLETE
1 series · 14 of 25 positions shown · non-contrast
Comparison: Ultrasound abdomen 09/27/2010

CLINICAL DATA: Abnormal LFT. Nausea and vomiting and diarrhea.
Right upper quadrant pain

EXAM:
ABDOMEN ULTRASOUND COMPLETE

[Series 1: us abdomen complete · 0.20mm/px · 14 of 104 slices shown]
[im 1/104]
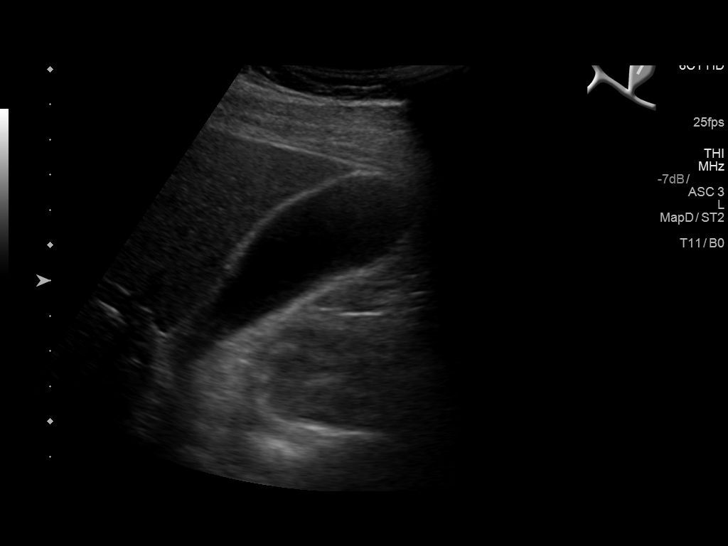
[im 9/104]
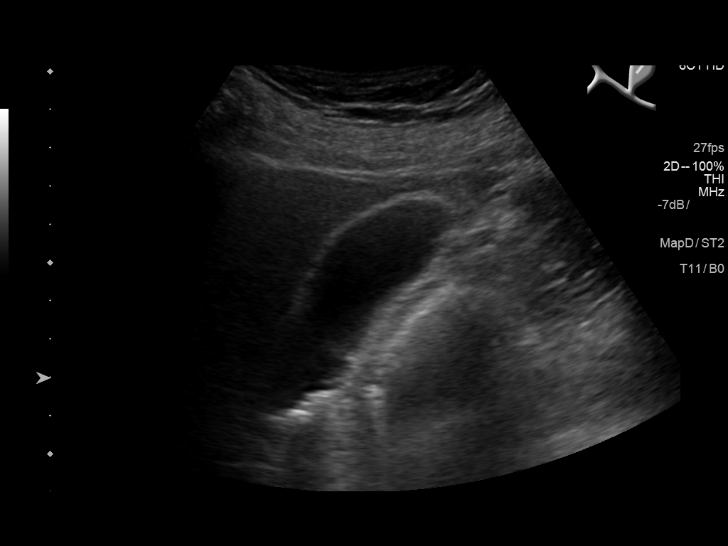
[im 18/104]
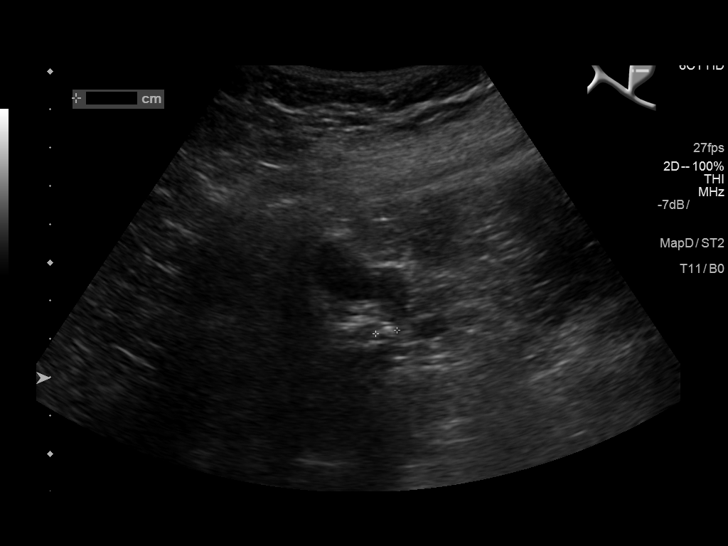
[im 26/104]
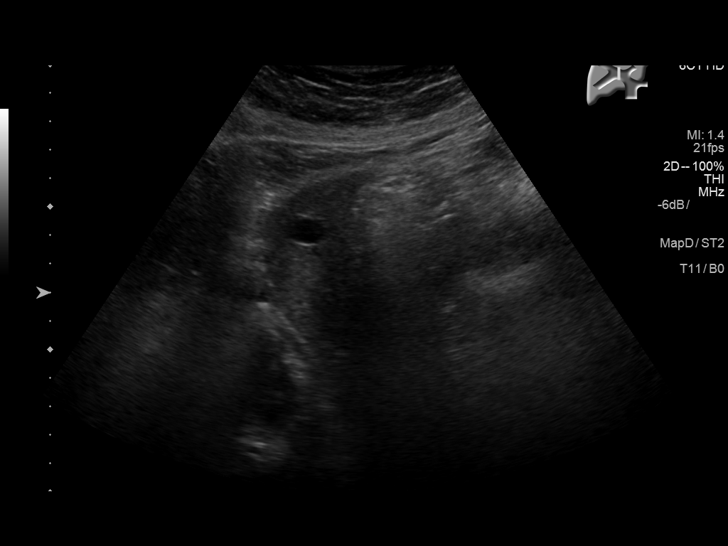
[im 35/104]
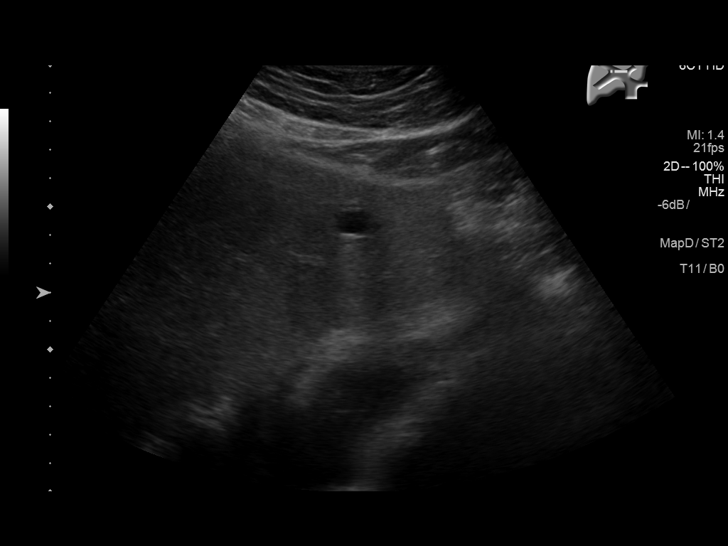
[im 39/104]
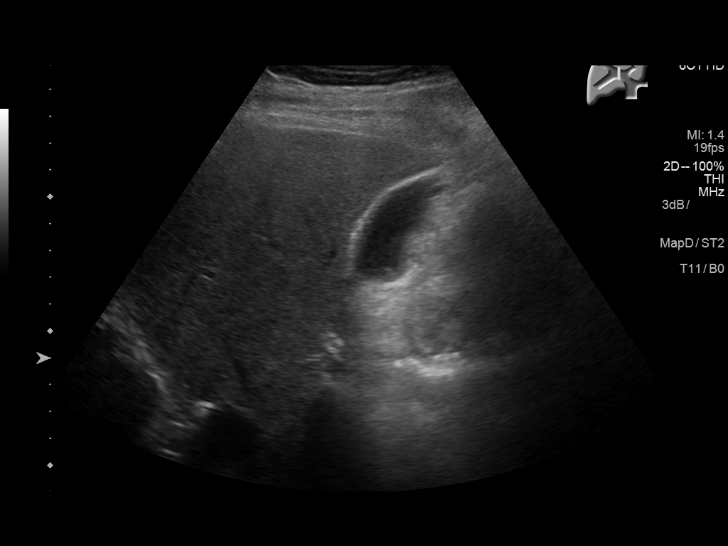
[im 48/104]
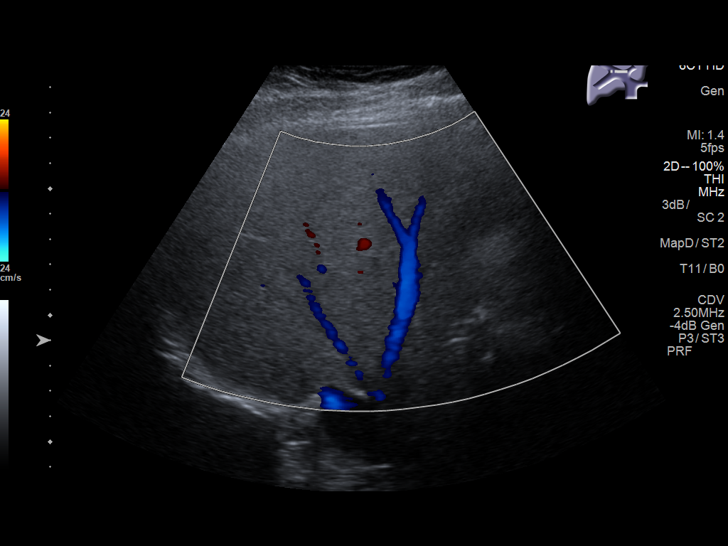
[im 56/104]
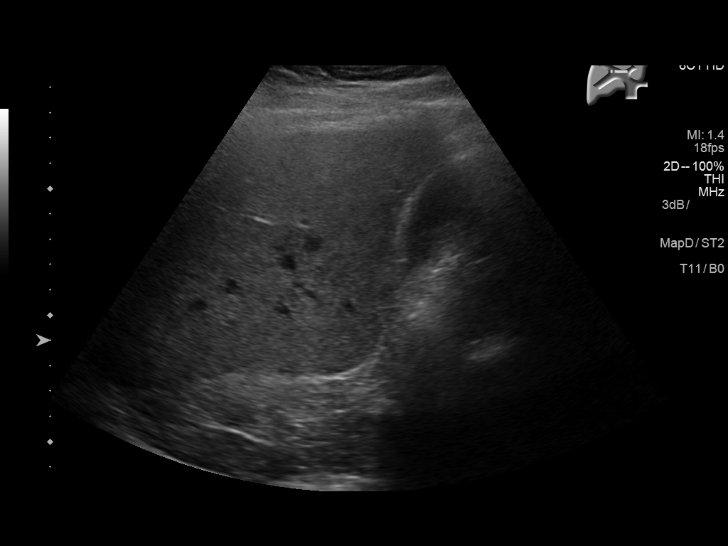
[im 65/104]
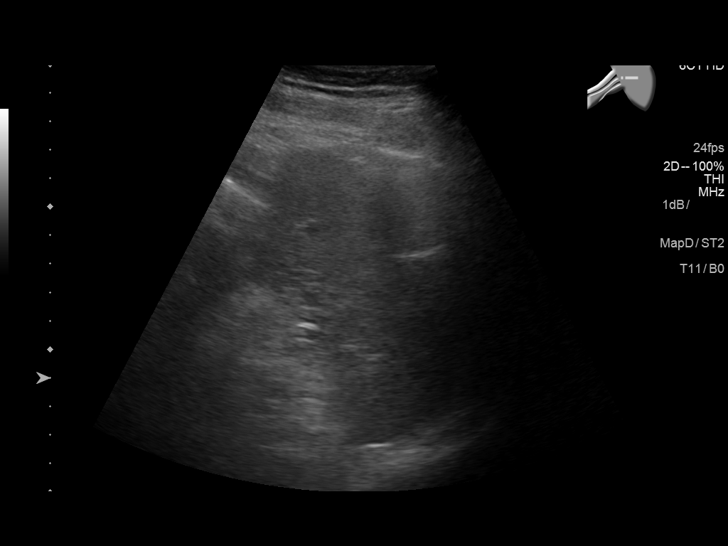
[im 69/104]
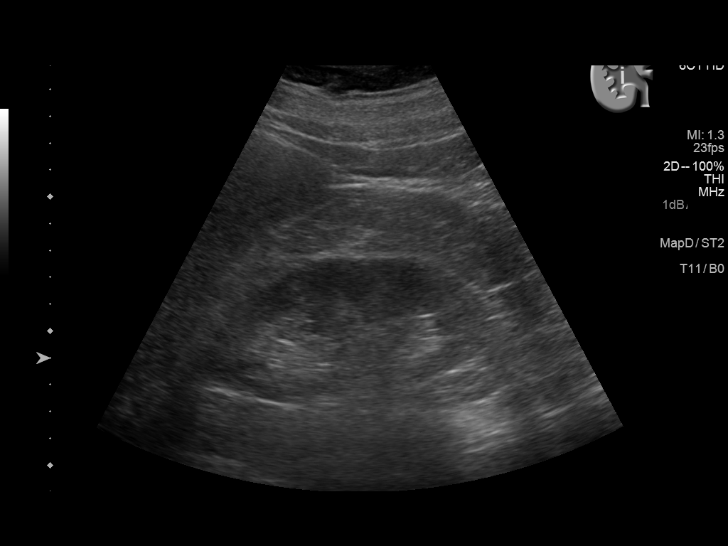
[im 78/104]
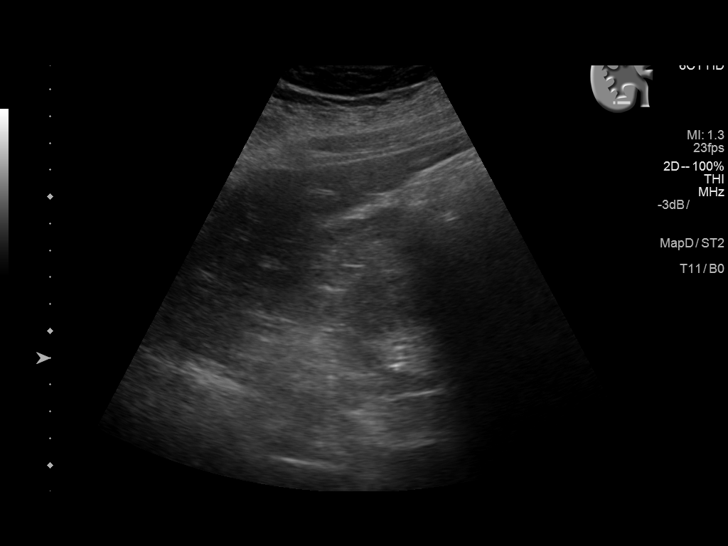
[im 86/104]
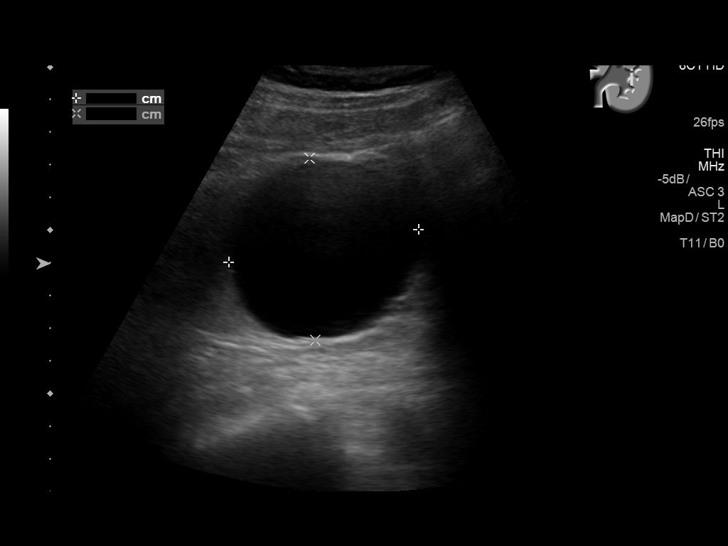
[im 95/104]
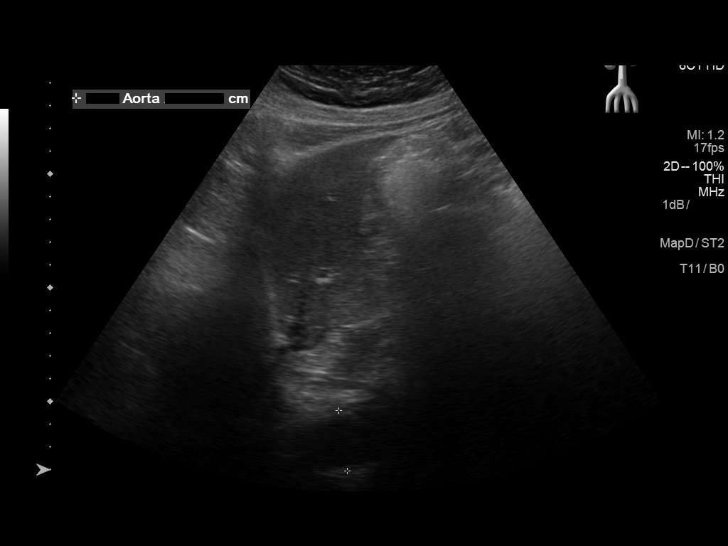
[im 104/104]
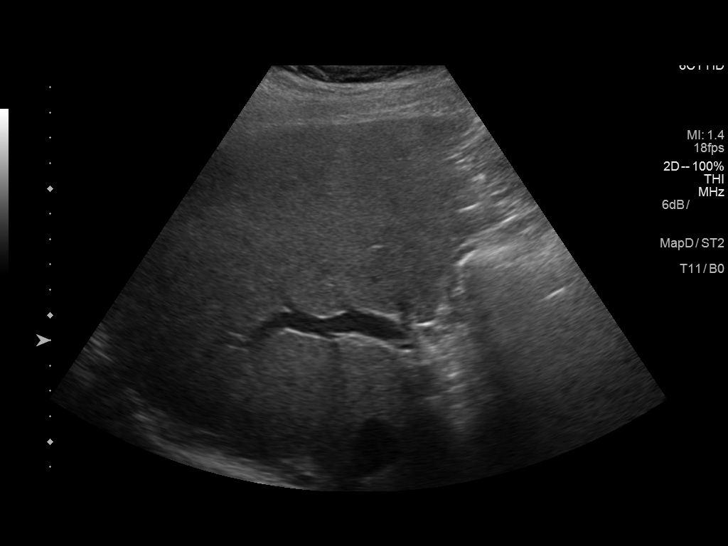

[14 of 25 positions shown; findings below may reference images not displayed]

FINDINGS: Gallbladder: Multiple mobile gallstones. Largest gallstone 7 mm.
Gallbladder wall thickness 2.2 mm. Nontender over the gallbladder
during scanning.

Common bile duct: Diameter: 3.8 mm

Liver: 15 mm cyst left lobe of the liver. Liver echogenicity is
within normal limits. No liver mass.

IVC: No abnormality visualized.

Pancreas: Visualized portion unremarkable.

Spleen: Size and appearance within normal limits.

Right Kidney: Length: 10.3 cm. Echogenicity within normal limits. No
mass or hydronephrosis visualized.

Left Kidney: Length: 11.6 cm. 5.9 cm cyst left lower pole..
Echogenicity within normal limits. No mass or hydronephrosis
visualized.

Abdominal aorta: No aneurysm visualized.

Other findings: None.
IMPRESSION: Cholelithiasis. No evidence of acute cholecystitis. Bile ducts
nondilated.

## 2016-05-10 DIAGNOSIS — J208 Acute bronchitis due to other specified organisms: Secondary | ICD-10-CM | POA: Diagnosis not present

## 2016-05-10 DIAGNOSIS — J4520 Mild intermittent asthma, uncomplicated: Secondary | ICD-10-CM | POA: Diagnosis not present

## 2016-05-10 DIAGNOSIS — R0981 Nasal congestion: Secondary | ICD-10-CM | POA: Diagnosis not present

## 2016-06-02 ENCOUNTER — Other Ambulatory Visit: Payer: Self-pay | Admitting: Internal Medicine

## 2016-06-04 ENCOUNTER — Encounter: Payer: Self-pay | Admitting: Internal Medicine

## 2016-06-05 MED ORDER — PRAMIPEXOLE DIHYDROCHLORIDE 0.25 MG PO TABS: 0.3750 mg | ORAL_TABLET | Freq: Every day | ORAL | 3 refills | Status: DC

## 2016-06-05 NOTE — Telephone Encounter (Signed)
rx sent in for mirapex #[id number] with 3 refills.

## 2016-06-30 ENCOUNTER — Other Ambulatory Visit: Payer: Self-pay | Admitting: Internal Medicine

## 2016-08-03 ENCOUNTER — Encounter: Payer: Self-pay | Admitting: Internal Medicine

## 2016-08-03 ENCOUNTER — Ambulatory Visit (INDEPENDENT_AMBULATORY_CARE_PROVIDER_SITE_OTHER): Payer: Medicare Other | Admitting: Internal Medicine

## 2016-08-03 DIAGNOSIS — G4730 Sleep apnea, unspecified: Secondary | ICD-10-CM

## 2016-08-03 DIAGNOSIS — J4520 Mild intermittent asthma, uncomplicated: Secondary | ICD-10-CM | POA: Diagnosis not present

## 2016-08-03 DIAGNOSIS — R7989 Other specified abnormal findings of blood chemistry: Secondary | ICD-10-CM | POA: Diagnosis not present

## 2016-08-03 DIAGNOSIS — G2581 Restless legs syndrome: Secondary | ICD-10-CM

## 2016-08-03 DIAGNOSIS — K219 Gastro-esophageal reflux disease without esophagitis: Secondary | ICD-10-CM

## 2016-08-03 DIAGNOSIS — R945 Abnormal results of liver function studies: Secondary | ICD-10-CM

## 2016-08-03 DIAGNOSIS — E7800 Pure hypercholesterolemia, unspecified: Secondary | ICD-10-CM

## 2016-08-03 NOTE — Progress Notes (Signed)
Pre-visit discussion using our clinic review tool. No additional management support is needed unless otherwise documented below in the visit note.  

## 2016-08-03 NOTE — Progress Notes (Signed)
Patient ID: Joseph Glimpse., male   DOB: 1946-03-08, 71 y.o.   MRN: [id number]   Subjective:    Patient ID: Joseph Glimpse., male    DOB: 10-Apr-1946, 71 y.o.   MRN: [id number]  HPI  Patient here for a scheduled follow up.  He is accompanied by his wife.  History obtained from both of them.  He has adjusted his diet.  Has lost weight.  Trying to stay active.  No chest pain.  No sob.  No acid reflux.  No abdominal pain or cramping.  Bowels stable.  Feels good.  Sold their house.     Past Medical History:  Diagnosis Date  . Asthma   . Bladder outlet obstruction   . Crohn's disease ([place])    appendix, s/p appendectomy  . GERD (gastroesophageal reflux disease)    schatzki ring  . Hypercholesterolemia   . Restless leg syndrome   . Seizure disorder [facility])    age 71, previously on phenobarbital  . Skin cancer, basal cell   . Sleep apnea    Past Surgical History:  Procedure Laterality Date  . APPENDECTOMY  1992   lysis of adhesions (bowel blockage)  . inguinal hernia surgery  1999   left  . KNEE ARTHROSCOPY  1982  . [place]  . TONSILLECTOMY AND ADENOIDECTOMY  1956   Family History  Problem Relation Age of Onset  . Heart disease Father   . Congestive Heart Failure Mother   . Colon cancer Neg Hx   . Prostate cancer Neg Hx    Social History   Social History  . Marital status: Married    Spouse name: N/A  . Number of children: 0  . Years of education: N/A   Social History Main Topics  . Smoking status: Never Smoker  . Smokeless tobacco: Never Used  . Alcohol use No  . Drug use: No  . Sexual activity: Not Currently   Other Topics Concern  . None   Social History Narrative  . None    Outpatient Encounter Prescriptions as of 08/03/2016  Medication Sig  . albuterol (PROVENTIL HFA;VENTOLIN HFA) 108 (90 BASE) MCG/ACT inhaler Inhale 2 puffs into the lungs every 6 (six) hours as needed for wheezing or shortness of breath.  . calcium gluconate 500 MG  tablet Take 500 mg by mouth 2 (two) times daily.  . Cholecalciferol (VITAMIN D-3) 1000 UNITS CAPS Take 1 capsule by mouth daily.  . finasteride (PROSCAR) 5 MG tablet Take 5 mg by mouth daily.  . Fluticasone-Salmeterol (ADVAIR DISKUS) 100-50 MCG/DOSE AEPB INHALE 1 PUFF EVERY DAY  . Magnesium 250 MG TABS Two tablets q day  . omeprazole (PRILOSEC) 20 MG capsule Take 20 mg by mouth daily.  . pramipexole (MIRAPEX) 0.25 MG tablet Take 1.5 tablets (0.375 mg total) by mouth daily.  . pravastatin (PRAVACHOL) 10 MG tablet TAKE 1 TABLET (10 MG TOTAL) BY MOUTH DAILY.   No facility-administered encounter medications on file as of 08/03/2016.     Review of Systems  Constitutional: Negative for appetite change and unexpected weight change.  HENT: Negative for congestion and sinus pressure.   Respiratory: Negative for cough, chest tightness and shortness of breath.   Cardiovascular: Negative for chest pain, palpitations and leg swelling.  Gastrointestinal: Negative for abdominal pain, diarrhea, nausea and vomiting.  Genitourinary: Negative for difficulty urinating and dysuria.  Musculoskeletal: Negative for back pain and joint swelling.  Skin: Negative for color change and rash.  Neurological: Negative for dizziness, light-headedness and headaches.  Psychiatric/Behavioral: Negative for agitation and dysphoric mood.       Objective:    Physical Exam  Constitutional: He appears well-developed and well-nourished. No distress.  HENT:  Nose: Nose normal.  Mouth/Throat: Oropharynx is clear and moist.  Neck: Neck supple. No thyromegaly present.  Cardiovascular: Normal rate and regular rhythm.   Pulmonary/Chest: Effort normal and breath sounds normal. No respiratory distress.  Abdominal: Soft. Bowel sounds are normal. There is no tenderness.  Musculoskeletal: He exhibits no edema or tenderness.  Lymphadenopathy:    He has no cervical adenopathy.  Skin: No rash noted. No erythema.  Psychiatric: He has  a normal mood and affect. His behavior is normal.    BP 120/78 (BP Location: Left Arm, Patient Position: Sitting, Cuff Size: Normal)   Pulse 74   Temp 98 F (36.7 C) (Oral)   Ht 5\' 8"  (1.727 m)   Wt 219 lb 12.8 oz (99.7 kg)   SpO2 98%   BMI 33.42 kg/m  Wt Readings from Last 3 Encounters:  08/03/16 219 lb 12.8 oz (99.7 kg)  02/03/16 234 lb (106.1 kg)  09/14/15 237 lb 6.4 oz (107.7 kg)     Lab Results  Component Value Date   WBC 6.0 03/15/2016   HGB 16.5 03/15/2016   HCT 47.7 03/15/2016   PLT 246.0 03/15/2016   GLUCOSE 84 03/15/2016   CHOL 172 03/15/2016   TRIG 114.0 03/15/2016   HDL 43.50 03/15/2016   LDLDIRECT 154.7 05/22/2013   LDLCALC 105 (H) 03/15/2016   ALT 15 03/15/2016   AST 42 (H) 03/15/2016   NA 140 03/15/2016   K 4.4 03/15/2016   CL 102 03/15/2016   CREATININE 1.15 03/15/2016   BUN 15 03/15/2016   CO2 32 03/15/2016   TSH 1.89 03/15/2016    US Abdomen Complete  Result Date: 08/12/2015 CLINICAL DATA:  Abnormal LFT. Nausea and vomiting and diarrhea. Right upper quadrant pain EXAM: ABDOMEN ULTRASOUND COMPLETE COMPARISON:  Ultrasound abdomen 09/27/2010 FINDINGS: Gallbladder: Multiple mobile gallstones. Largest gallstone 7 mm. Gallbladder wall thickness 2.2 mm. Nontender over the gallbladder during scanning. Common bile duct: Diameter: 3.8 mm Liver: 15 mm cyst left lobe of the liver. Liver echogenicity is within normal limits. No liver mass. IVC: No abnormality visualized. Pancreas: Visualized portion unremarkable. Spleen: Size and appearance within normal limits. Right Kidney: Length: 10.3 cm. Echogenicity within normal limits. No mass or hydronephrosis visualized. Left Kidney: Length: 11.6 cm. 5.9 cm cyst left lower pole. Echogenicity within normal limits. No mass or hydronephrosis visualized. Abdominal aorta: No aneurysm visualized. Other findings: None. IMPRESSION: Cholelithiasis. No evidence of acute cholecystitis. Bile ducts nondilated. Electronically Signed   By:  Franchot Gallo M.D.   On: 08/12/2015 10:12       Assessment & Plan:   Problem List Items Addressed This Visit    Abnormal liver function test    Has adjusted his diet.  Lost weight.  Follow liver function tests.        Relevant Orders   Hepatic function panel   Asthma    On advair.  Breathing stable.       GERD (gastroesophageal reflux disease)    On prilosec.  Controlled.       Hypercholesterolemia    Has adjusted diet.  Lost weight.  Follow lipid panel.        Relevant Orders   Lipid panel   Basic metabolic panel   Restless leg syndrome    Stable  on mirapex.       Sleep apnea    Continue CPAP.           Einar Pheasant, MD

## 2016-08-04 ENCOUNTER — Encounter: Payer: Self-pay | Admitting: Internal Medicine

## 2016-08-04 NOTE — Assessment & Plan Note (Signed)
Continue CPAP.  

## 2016-08-04 NOTE — Assessment & Plan Note (Signed)
Has adjusted diet.  Lost weight.  Follow lipid panel.  

## 2016-08-04 NOTE — Assessment & Plan Note (Signed)
Has adjusted his diet.  Lost weight.  Follow liver function tests.

## 2016-08-04 NOTE — Assessment & Plan Note (Signed)
On prilosec.  Controlled.   

## 2016-08-04 NOTE — Assessment & Plan Note (Signed)
On advair.  Breathing stable.

## 2016-08-04 NOTE — Assessment & Plan Note (Signed)
Stable on mirapex.

## 2016-09-07 ENCOUNTER — Other Ambulatory Visit (INDEPENDENT_AMBULATORY_CARE_PROVIDER_SITE_OTHER): Payer: Medicare Other

## 2016-09-07 DIAGNOSIS — E7800 Pure hypercholesterolemia, unspecified: Secondary | ICD-10-CM | POA: Diagnosis not present

## 2016-09-07 DIAGNOSIS — R945 Abnormal results of liver function studies: Secondary | ICD-10-CM

## 2016-09-07 DIAGNOSIS — R7989 Other specified abnormal findings of blood chemistry: Secondary | ICD-10-CM

## 2016-09-07 LAB — HEPATIC FUNCTION PANEL
ALK PHOS: 59 U/L (ref 39–117)
ALT: 16 U/L (ref 0–53)
AST: 42 U/L — ABNORMAL HIGH (ref 0–37)
Albumin: 4 g/dL (ref 3.5–5.2)
BILIRUBIN TOTAL: 0.7 mg/dL (ref 0.2–1.2)
Bilirubin, Direct: 0.1 mg/dL (ref 0.0–0.3)
Total Protein: 7.1 g/dL (ref 6.0–8.3)

## 2016-09-07 LAB — BASIC METABOLIC PANEL
BUN: 13 mg/dL (ref 6–23)
CALCIUM: 9.2 mg/dL (ref 8.4–10.5)
CO2: 32 mEq/L (ref 19–32)
Chloride: 103 mEq/L (ref 96–112)
Creatinine, Ser: 1.05 mg/dL (ref 0.40–1.50)
GFR: 74.29 mL/min (ref >60.00)
Glucose, Bld: 95 mg/dL (ref 70–99)
Potassium: 4.2 mEq/L (ref 3.5–5.1)
Sodium: 138 mEq/L (ref 135–145)

## 2016-09-07 LAB — LIPID PANEL
CHOL/HDL RATIO: 4
Cholesterol: 178 mg/dL (ref 0–200)
HDL: 49.5 mg/dL (ref >39.00)
LDL CALC: 109 mg/dL — AB (ref 0–99)
NONHDL: 128.3
TRIGLYCERIDES: 99 mg/dL (ref 0.0–149.0)
VLDL: 19.8 mg/dL (ref 0.0–40.0)

## 2016-09-08 ENCOUNTER — Encounter: Payer: Self-pay | Admitting: Internal Medicine

## 2016-09-13 ENCOUNTER — Ambulatory Visit (INDEPENDENT_AMBULATORY_CARE_PROVIDER_SITE_OTHER): Payer: Medicare Other

## 2016-09-13 VITALS — BP 124/80 | HR 87 | Temp 98.4°F | Resp 12 | Ht 68.0 in | Wt 227.8 lb

## 2016-09-13 DIAGNOSIS — Z1159 Encounter for screening for other viral diseases: Secondary | ICD-10-CM | POA: Diagnosis not present

## 2016-09-13 DIAGNOSIS — Z0000 Encounter for general adult medical examination without abnormal findings: Secondary | ICD-10-CM

## 2016-09-13 NOTE — Progress Notes (Signed)
Subjective:   Joseph Good. is a 71 y.o. male who presents for Medicare Annual/Subsequent preventive examination.  Review of Systems:  .No ROS.  Medicare Wellness Visit.  Cardiac Risk Factors include: advanced age (>61men, >51 women);male gender     Objective:    Vitals: BP 124/80 (BP Location: Left Arm, Patient Position: Sitting, Cuff Size: Normal)   Pulse 87   Temp 98.4 F (36.9 C) (Oral)   Resp 12   Ht 5\' 8"  (1.727 m)   Wt 227 lb 12.8 oz (103.3 kg)   SpO2 96%   BMI 34.64 kg/m   Body mass index is 34.64 kg/m.  Tobacco History  Smoking Status  . Never Smoker  Smokeless Tobacco  . Never Used     Counseling given: Not Answered   Past Medical History:  Diagnosis Date  . Asthma   . Bladder outlet obstruction   . Crohn's disease ([place])    appendix, s/p appendectomy  . GERD (gastroesophageal reflux disease)    schatzki ring  . Hypercholesterolemia   . Restless leg syndrome   . Seizure disorder [facility])    age 43, previously on phenobarbital  . Skin cancer, basal cell   . Sleep apnea    Past Surgical History:  Procedure Laterality Date  . APPENDECTOMY  1992   lysis of adhesions (bowel blockage)  . inguinal hernia surgery  1999   left  . KNEE ARTHROSCOPY  1982  . [place]  . TONSILLECTOMY AND ADENOIDECTOMY  1956   Family History  Problem Relation Age of Onset  . Heart disease Father   . Congestive Heart Failure Mother   . Colon cancer Neg Hx   . Prostate cancer Neg Hx    History  Sexual Activity  . Sexual activity: Not Currently    Outpatient Encounter Prescriptions as of 09/13/2016  Medication Sig  . albuterol (PROVENTIL HFA;VENTOLIN HFA) 108 (90 BASE) MCG/ACT inhaler Inhale 2 puffs into the lungs every 6 (six) hours as needed for wheezing or shortness of breath.  Marland Kitchen CALCIUM CITRATE PO Take 1 tablet by mouth daily.  . Cholecalciferol (VITAMIN D-3) 1000 UNITS CAPS Take 1 capsule by mouth daily.  . finasteride (PROSCAR) 5 MG  tablet Take 5 mg by mouth daily.  . Fluticasone-Salmeterol (ADVAIR DISKUS) 100-50 MCG/DOSE AEPB INHALE 1 PUFF EVERY DAY  . Magnesium 250 MG TABS Two tablets q day  . omeprazole (PRILOSEC) 20 MG capsule Take 20 mg by mouth daily.  . pramipexole (MIRAPEX) 0.25 MG tablet Take 1.5 tablets (0.375 mg total) by mouth daily.  . pravastatin (PRAVACHOL) 10 MG tablet TAKE 1 TABLET (10 MG TOTAL) BY MOUTH DAILY.  . [DISCONTINUED] calcium gluconate 500 MG tablet Take 500 mg by mouth 2 (two) times daily.   No facility-administered encounter medications on file as of 09/13/2016.     Activities of Daily Living In your present state of health, do you have any difficulty performing the following activities: 09/13/2016 09/14/2015  Hearing? N Y  Vision? N N  Difficulty concentrating or making decisions? N N  Walking or climbing stairs? Y Y  Dressing or bathing? N N  Doing errands, shopping? N N  Preparing Food and eating ? N N  Using the Toilet? N N  In the past six months, have you accidently leaked urine? N N  Do you have problems with loss of bowel control? N N  Managing your Medications? N N  Managing your Finances? N N  Housekeeping or managing your Housekeeping? N N  Some recent data might be hidden    Patient Care Team: Einar Pheasant, MD as PCP - General (Internal Medicine)   Assessment:    This is a routine wellness examination for Joseph Good. The goal of the wellness visit is to assist the patient how to close the gaps in care and create a preventative care plan for the patient.   Taking calcium VIT D as appropriate/Osteoporosis reviewed.  Medications reviewed; taking without issues or barriers.  Safety issues reviewed; smoke and carbon monoxide detectors in the home. Firearms locked in a safe within the home.  Wears seatbelts when driving or riding with others. Patient does wear sunscreen or protective clothing when in direct sunlight. No violence in the home.  Depression- PHQ 2 &9  complete.  No signs/symptoms or verbal communication regarding little pleasure in doing things, feeling down, depressed or hopeless. No changes in sleeping, energy, eating, concentrating.  No thoughts of self harm or harm towards others.  Time spent on this topic is 8 minutes.   Patient is alert, normal appearance, oriented to person/place/and time. Correctly identified the president of the [place], recall of 3/3 words, and performing simple calculations.  Patient displays appropriate judgement and can read correct time from watch face.  No new identified risk were noted.  No failures at ADL's or IADL's.   BMI- discussed the importance of a healthy diet, water intake and exercise. Educational material provided.   Dental- every six months.  Dr. Marliss Czar.  Eye- Visual acuity not assessed per patient preference since they have regular follow up with the ophthalmologist.  Wears corrective lenses.  Sleep patterns- Sleeps 7-8 hours at night.  Wakes feeling rested. CPAP in use.  Hepatitis C Screening discussed. Completed today.  Educational material provided.  Health maintenance gaps- closed.  Patient Concerns: None at this time. Follow up with PCP as needed.  Exercise Activities and Dietary recommendations Current Exercise Habits: Home exercise routine, Type of exercise: walking, Time (Minutes): 20, Frequency (Times/Week): 4, Weekly Exercise (Minutes/Week): 80, Intensity: Mild  Goals    . Healthy Lifestyle          Stay hydrated and drink plenty of fluids/water. Low carb foods.  Lean meats and vegetables. Stay active and continue to walk for exercise. Stretch!      Fall Risk Fall Risk  09/13/2016 02/03/2016 09/14/2015 07/28/2015 07/29/2014  Falls in the past year? No No No No No   Depression Screen PHQ 2/9 Scores 09/13/2016 02/03/2016 09/14/2015 07/28/2015  PHQ - 2 Score 0 0 0 0  PHQ- 9 Score 0 - - -    Cognitive Function MMSE - Mini Mental State Exam 09/13/2016 09/14/2015  Orientation to  time 5 5  Orientation to Place 5 5  Registration 3 3  Attention/ Calculation 5 5  Recall 3 3  Language- name 2 objects 2 2  Language- repeat 1 1  Language- follow 3 step command 3 3  Language- read & follow direction 1 1  Write a sentence 1 1  Copy design 1 1  Total score 30 30        Immunization History  Administered Date(s) Administered  . Influenza Split 03/02/2013  . Influenza, High Dose Seasonal PF 02/03/2016  . Influenza,inj,Quad PF,36+ Mos 01/22/2014, 01/31/2015  . Pneumococcal Conjugate-13 07/22/2013  . Pneumococcal Polysaccharide-23 02/11/2015  . Tdap 12/30/2015   Screening Tests Health Maintenance  Topic Date Due  . INFLUENZA VACCINE  11/28/2016  . COLONOSCOPY  12/29/2020  . TETANUS/TDAP  12/29/2025  . Hepatitis C Screening  Completed  . PNA vac Low Risk Adult  Completed      Plan:    End of life planning; Advance aging; Advanced directives discussed. Copy of current HCPOA/Living Will declined.      I have personally reviewed and noted the following in the patient's chart:   . Medical and social history . Use of alcohol, tobacco or illicit drugs  . Current medications and supplements . Functional ability and status . Nutritional status . Physical activity . Advanced directives . List of other physicians . Hospitalizations, surgeries, and ER visits in previous 12 months . Vitals . Screenings to include cognitive, depression, and falls . Referrals and appointments  In addition, I have reviewed and discussed with patient certain preventive protocols, quality metrics, and best practice recommendations. A written personalized care plan for preventive services as well as general preventive health recommendations were provided to patient.     Varney Biles, LPN  1/47/0929    Reviewed above information.  Agree with plan.  Dr Nicki Reaper

## 2016-09-13 NOTE — Patient Instructions (Addendum)
  Joseph Good , Thank you for taking time to come for your Medicare Wellness Visit. I appreciate your ongoing commitment to your health goals. Please review the following plan we discussed and let me know if I can assist you in the future.   Follow up with Dr. Nicki Reaper as needed.    Have a great day!  These are the goals we discussed: Goals    . Healthy Lifestyle          Stay hydrated and drink plenty of fluids.  Increase water intake by 1 bottle (=2cups). Low carb foods.  Lean meats and vegetables. Stay active and continue to walk for exercise. Stretch!       This is a list of the screening recommended for you and due dates:  Health Maintenance  Topic Date Due  .  Hepatitis C: One time screening is recommended by Center for Disease Control  (CDC) for  adults born from [birth year] through 1964.   1945/06/11  . Flu Shot  11/28/2016  . Colon Cancer Screening  12/29/2020  . Tetanus Vaccine  12/29/2025  . Pneumonia vaccines  Completed

## 2016-09-14 ENCOUNTER — Encounter: Payer: Self-pay | Admitting: Internal Medicine

## 2016-09-14 LAB — HEPATITIS C ANTIBODY: HCV AB: NEGATIVE

## 2016-10-10 DIAGNOSIS — Z872 Personal history of diseases of the skin and subcutaneous tissue: Secondary | ICD-10-CM | POA: Diagnosis not present

## 2016-10-10 DIAGNOSIS — Z859 Personal history of malignant neoplasm, unspecified: Secondary | ICD-10-CM | POA: Diagnosis not present

## 2016-10-10 DIAGNOSIS — L821 Other seborrheic keratosis: Secondary | ICD-10-CM | POA: Diagnosis not present

## 2016-10-10 DIAGNOSIS — L570 Actinic keratosis: Secondary | ICD-10-CM | POA: Diagnosis not present

## 2016-11-15 DIAGNOSIS — R05 Cough: Secondary | ICD-10-CM | POA: Diagnosis not present

## 2016-11-15 DIAGNOSIS — K210 Gastro-esophageal reflux disease with esophagitis: Secondary | ICD-10-CM | POA: Diagnosis not present

## 2016-11-17 ENCOUNTER — Emergency Department: Payer: Medicare Other

## 2016-11-17 ENCOUNTER — Emergency Department
Admission: EM | Admit: 2016-11-17 | Discharge: 2016-11-18 | Disposition: A | Discharge status: Home / Self Care | Payer: Medicare Other | Attending: Emergency Medicine | Admitting: Emergency Medicine

## 2016-11-17 ENCOUNTER — Encounter: Payer: Self-pay | Admitting: Emergency Medicine

## 2016-11-17 DIAGNOSIS — J40 Bronchitis, not specified as acute or chronic: Secondary | ICD-10-CM

## 2016-11-17 DIAGNOSIS — Z85828 Personal history of other malignant neoplasm of skin: Secondary | ICD-10-CM | POA: Insufficient documentation

## 2016-11-17 DIAGNOSIS — Z79899 Other long term (current) drug therapy: Secondary | ICD-10-CM | POA: Diagnosis not present

## 2016-11-17 DIAGNOSIS — R202 Paresthesia of skin: Secondary | ICD-10-CM | POA: Insufficient documentation

## 2016-11-17 DIAGNOSIS — R05 Cough: Secondary | ICD-10-CM | POA: Diagnosis not present

## 2016-11-17 DIAGNOSIS — R42 Dizziness and giddiness: Secondary | ICD-10-CM | POA: Diagnosis not present

## 2016-11-17 LAB — CBC
HEMATOCRIT: 44.4 % (ref 40.0–52.0)
HEMOGLOBIN: 15.2 g/dL (ref 13.0–18.0)
MCH: 32.2 pg (ref 26.0–34.0)
MCHC: 34.3 g/dL (ref 32.0–36.0)
MCV: 94 fL (ref 80.0–100.0)
Platelets: 229 10*3/uL (ref 150–440)
RBC: 4.72 MIL/uL (ref 4.40–5.90)
RDW: 13.1 % (ref 11.5–14.5)
WBC: 10.5 10*3/uL (ref 3.8–10.6)

## 2016-11-17 LAB — COMPREHENSIVE METABOLIC PANEL
ALK PHOS: 55 U/L (ref 38–126)
ALT: 19 U/L (ref 17–63)
AST: 55 U/L — AB (ref 15–41)
Albumin: 4.1 g/dL (ref 3.5–5.0)
Anion gap: 6 (ref 5–15)
BILIRUBIN TOTAL: 0.7 mg/dL (ref 0.3–1.2)
BUN: 17 mg/dL (ref 6–20)
CALCIUM: 9.1 mg/dL (ref 8.9–10.3)
CO2: 29 mmol/L (ref 22–32)
CREATININE: 1.06 mg/dL (ref 0.61–1.24)
Chloride: 105 mmol/L (ref 101–111)
GFR calc Af Amer: >60 mL/min (ref >60)
Glucose, Bld: 142 mg/dL — ABNORMAL HIGH (ref 65–99)
Potassium: 4.3 mmol/L (ref 3.5–5.1)
Sodium: 140 mmol/L (ref 135–145)
TOTAL PROTEIN: 7.3 g/dL (ref 6.5–8.1)

## 2016-11-17 LAB — DIFFERENTIAL
BASOS ABS: 0 10*3/uL (ref 0–0.1)
BASOS PCT: 0 %
Eosinophils Absolute: 0 10*3/uL (ref 0–0.7)
Eosinophils Relative: 0 %
LYMPHS ABS: 0.6 10*3/uL — AB (ref 1.0–3.6)
LYMPHS PCT: 6 %
MONO ABS: 0.2 10*3/uL (ref 0.2–1.0)
MONOS PCT: 2 %
NEUTROS ABS: 9.7 10*3/uL — AB (ref 1.4–6.5)
Neutrophils Relative %: 92 %

## 2016-11-17 LAB — APTT: aPTT: 31 seconds (ref 24–36)

## 2016-11-17 LAB — TROPONIN I

## 2016-11-17 LAB — PROTIME-INR
INR: 1
Prothrombin Time: 13.2 seconds (ref 11.4–15.2)

## 2016-11-17 MED ORDER — AZITHROMYCIN 250 MG PO TABS: ORAL_TABLET | ORAL | 0 refills | Status: DC

## 2016-11-17 NOTE — ED Provider Notes (Signed)
[facility] Emergency Department Provider Note  ____________________________________________  Time seen: Approximately 11:05 PM  I have reviewed the triage vital signs and the nursing notes.   HISTORY  Chief Complaint Dizziness and Transient Ischemic Attack    HPI Joseph Good. is a 71 y.o. male Well-appearing no acute distress, presents with a total of about 10 episodes over the last 24 hours of what he describes as a brief paresthesia lasting about 15 seconds at a time. He denies any motor weakness or any persistent symptoms. No vision changes or headache. No recent trauma. No aggravating or alleviating factors. Otherwise feels well, eating and drinking normally. His only other complaint is that over the last month he has had a occasional productive cough. He has a history of asthma but feels like this is well controlled on his daily Advair and denies acute shortness of breath fever chills or sweats.     Past Medical History:  Diagnosis Date  . Asthma   . Bladder outlet obstruction   . Crohn's disease ([place])    appendix, s/p appendectomy  . GERD (gastroesophageal reflux disease)    schatzki ring  . Hypercholesterolemia   . Restless leg syndrome   . Seizure disorder [facility])    age 68, previously on phenobarbital  . Skin cancer, basal cell   . Sleep apnea      Patient Active Problem List   Diagnosis Date Noted  . Diarrhea 08/02/2015  . Leukocytosis 08/02/2015  . Hyponatremia 08/02/2015  . URI (upper respiratory infection) 08/18/2014  . Health care maintenance 08/01/2014  . Abnormal liver function test 05/19/2012  . Osteoporosis 05/19/2012  . Asthma 04/29/2012  . Hypercholesterolemia 04/29/2012  . GERD (gastroesophageal reflux disease) 04/29/2012  . Restless leg syndrome 04/29/2012  . Sleep apnea 04/29/2012     Past Surgical History:  Procedure Laterality Date  . APPENDECTOMY  1992   lysis of adhesions (bowel blockage)  . inguinal  hernia surgery  1999   left  . KNEE ARTHROSCOPY  1982  . [place]  . TONSILLECTOMY AND ADENOIDECTOMY  1956     Prior to Admission medications   Medication Sig Start Date End Date Taking? Authorizing Provider  albuterol (PROVENTIL HFA;VENTOLIN HFA) 108 (90 BASE) MCG/ACT inhaler Inhale 2 puffs into the lungs every 6 (six) hours as needed for wheezing or shortness of breath. 08/16/14   Einar Pheasant, MD  azithromycin (ZITHROMAX Z-PAK) 250 MG tablet Take 2 tablets (500 mg) on  Day 1,  followed by 1 tablet (250 mg) once daily on Days 2 through 5. 11/17/16   Carrie Mew, MD  CALCIUM CITRATE PO Take 1 tablet by mouth daily.    [provider]  Cholecalciferol (VITAMIN D-3) 1000 UNITS CAPS Take 1 capsule by mouth daily.    [provider]  finasteride (PROSCAR) 5 MG tablet Take 5 mg by mouth daily.    [provider]  Fluticasone-Salmeterol (ADVAIR DISKUS) 100-50 MCG/DOSE AEPB INHALE 1 PUFF EVERY DAY 11/25/15   Einar Pheasant, MD  Magnesium 250 MG TABS Two tablets q day    [provider]  omeprazole (PRILOSEC) 20 MG capsule Take 20 mg by mouth daily.    [provider]  pramipexole (MIRAPEX) 0.25 MG tablet Take 1.5 tablets (0.375 mg total) by mouth daily. 06/05/16   Einar Pheasant, MD  pravastatin (PRAVACHOL) 10 MG tablet TAKE 1 TABLET (10 MG TOTAL) BY MOUTH DAILY. 07/02/16   Einar Pheasant, MD  Allergies Seldane [terfenadine]   Family History  Problem Relation Age of Onset  . Heart disease Father   . Congestive Heart Failure Mother   . Colon cancer Neg Hx   . Prostate cancer Neg Hx     Social History Social History  Substance Use Topics  . Smoking status: Never Smoker  . Smokeless tobacco: Never Used  . Alcohol use No    Review of Systems  Constitutional:   No fever or chills.  ENT:   No sore throat. No rhinorrhea. Cardiovascular:   No chest pain or syncope. Respiratory:   No dyspnea Positive  cough. Gastrointestinal:   Negative for abdominal pain, vomiting and diarrhea.  Musculoskeletal:   Negative for focal pain or swelling All other systems reviewed and are negative except as documented above in ROS and HPI.  ____________________________________________   PHYSICAL EXAM:  VITAL SIGNS: ED Triage Vitals  Enc Vitals Group     BP 11/17/16 1653 139/66     Pulse Rate 11/17/16 1653 65     Resp 11/17/16 1653 20     Temp --      Temp Source 11/17/16 1653 Oral     SpO2 11/17/16 1653 95 %     Weight 11/17/16 1654 225 lb (102.1 kg)     Height 11/17/16 1654 5' 8.5" (1.74 m)     Head Circumference --      Peak Flow --      Pain Score --      Pain Loc --      Pain Edu? --      Excl. in [place]? --     Vital signs reviewed, nursing assessments reviewed.   Constitutional:   Alert and oriented. Well appearing and in no distress. Eyes:   No scleral icterus.  EOMI. No nystagmus. No conjunctival pallor. PERRL. ENT   Head:   Normocephalic and atraumatic.   Nose:   No congestion/rhinnorhea.    Mouth/Throat:   MMM, no pharyngeal erythema. No peritonsillar mass.    Neck:   No meningismus. Full ROM Hematological/Lymphatic/Immunilogical:   No cervical lymphadenopathy. Cardiovascular:   RRR. Symmetric bilateral radial and DP pulses.  No murmurs.  Respiratory:   Normal respiratory effort without tachypnea/retractions. Breath sounds are clear and equal bilaterally. No wheezes/rales/rhonchi. With FEV1 maneuver, there is inducible end expiratory wheezing on the left. Gastrointestinal:   Soft and nontender. Non distended. There is no CVA tenderness.  No rebound, rigidity, or guarding. Genitourinary:   deferred Musculoskeletal:   Normal range of motion in all extremities. No joint effusions.  No lower extremity tenderness.  No edema. Neurologic:   Normal speech and language.  Motor grossly intact. No pronator drift. Normal finger to nose. Cranial nerves II through X intact. NIH  stroke scale 0 No gross focal neurologic deficits are appreciated.  Skin:    Skin is warm, dry and intact. No rash noted.  No petechiae, purpura, or bullae.  ____________________________________________    LABS (pertinent positives/negatives) (all labs ordered are listed, but only abnormal results are displayed) Labs Reviewed  DIFFERENTIAL - Abnormal; Notable for the following:       Result Value   Neutro Abs 9.7 (*)    Lymphs Abs 0.6 (*)    All other components within normal limits  COMPREHENSIVE METABOLIC PANEL - Abnormal; Notable for the following:    Glucose, Bld 142 (*)    AST 55 (*)    All other components within normal limits  PROTIME-INR  APTT  CBC  TROPONIN I  TROPONIN I  CBG MONITORING, ED   ____________________________________________   EKG  Interpreted by me Sinus rhythm rate of 66, left axis, first-degree AV block. Normal QRS ST segments and T waves.  ____________________________________________    RADIOLOGY  Dg Chest 2 View  Result Date: 11/17/2016 CLINICAL DATA:  Cough with left-sided wheezing EXAM: CHEST  2 VIEW COMPARISON:  07/31/2015 FINDINGS: Linear atelectasis or scar at the left base. Coarse interstitial opacity bilaterally. No focal consolidation. No pleural effusion. Stable cardiomediastinal silhouette. No pneumothorax. Mild wedging of midthoracic vertebra unchanged. IMPRESSION: 1. Linear scarring or atelectasis at the bases 2. Coarse bilateral interstitial opacity likely chronic Electronically Signed   By: Donavan Foil M.D.   On: 11/17/2016 21:36   Ct Head Wo Contrast  Result Date: 11/17/2016 CLINICAL DATA:  Intermittent lightheadedness EXAM: CT HEAD WITHOUT CONTRAST TECHNIQUE: Contiguous axial images were obtained from the base of the skull through the vertex without intravenous contrast. COMPARISON:  None. FINDINGS: Brain: No evidence of acute infarction, hemorrhage, hydrocephalus, extra-axial collection or mass lesion/mass effect. Chronic  bilateral basal ganglia lacunar infarcts. Vascular: No hyperdense vessel or unexpected calcification. Skull: Normal. Negative for fracture or focal lesion. Sinuses/Orbits: Partial opacification of the right maxillary sinus. Mastoid air cells are clear. Other: None. IMPRESSION: No evidence of acute intracranial abnormality. Chronic bilateral basal ganglia lacunar infarcts. Electronically Signed   By: Julian Hy M.D.   On: 11/17/2016 17:58    ____________________________________________   PROCEDURES Procedures  ____________________________________________   INITIAL IMPRESSION / ASSESSMENT AND PLAN / ED COURSE  Pertinent labs & imaging results that were available during my care of the patient were reviewed by me and considered in my medical decision making (see chart for details).  Well-appearing no acute distress, reassuring workup, symptoms are not consistent with stroke meningitis encephalitis or other acute neurologic event. No evidence of cardiopulmonary pathology, delta troponin negative, EKG nonacute. Vital signs unremarkable. Discharge home to follow up with primary care. He does have lateralizing expiratory wheezing on the left, so have him take azithromycin especially given the chronicity of the symptoms and his underlying lung disease.      ____________________________________________   FINAL CLINICAL IMPRESSION(S) / ED DIAGNOSES  Final diagnoses:  Paresthesia  Bronchitis      New Prescriptions   AZITHROMYCIN (ZITHROMAX Z-PAK) 250 MG TABLET    Take 2 tablets (500 mg) on  Day 1,  followed by 1 tablet (250 mg) once daily on Days 2 through 5.     Portions of this note were generated with dragon dictation software. Dictation errors may occur despite best attempts at proofreading.    Carrie Mew, MD 11/17/16 2312

## 2016-11-17 NOTE — Discharge Instructions (Signed)
Your results today were all unremarkable and reassuring. They did not show any acute findings to explain your symptoms. Please take the antibiotic as prescribed for your cough and follow up with your doctor.  Results for orders placed or performed during the hospital encounter of 11/17/16  Protime-INR  Result Value Ref Range   Prothrombin Time 13.2 11.4 - 15.2 seconds   INR 1.00   APTT  Result Value Ref Range   aPTT 31 24 - 36 seconds  CBC  Result Value Ref Range   WBC 10.5 3.8 - 10.6 K/uL   RBC 4.72 4.40 - 5.90 MIL/uL   Hemoglobin 15.2 13.0 - 18.0 g/dL   HCT 44.4 40.0 - 52.0 %   MCV 94.0 80.0 - 100.0 fL   MCH 32.2 26.0 - 34.0 pg   MCHC 34.3 32.0 - 36.0 g/dL   RDW 13.1 11.5 - 14.5 %   Platelets 229 150 - 440 K/uL  Differential  Result Value Ref Range   Neutrophils Relative % 92 %   Neutro Abs 9.7 (H) 1.4 - 6.5 K/uL   Lymphocytes Relative 6 %   Lymphs Abs 0.6 (L) 1.0 - 3.6 K/uL   Monocytes Relative 2 %   Monocytes Absolute 0.2 0.2 - 1.0 K/uL   Eosinophils Relative 0 %   Eosinophils Absolute 0.0 0 - 0.7 K/uL   Basophils Relative 0 %   Basophils Absolute 0.0 0 - 0.1 K/uL  Comprehensive metabolic panel  Result Value Ref Range   Sodium 140 135 - 145 mmol/L   Potassium 4.3 3.5 - 5.1 mmol/L   Chloride 105 101 - 111 mmol/L   CO2 29 22 - 32 mmol/L   Glucose, Bld 142 (H) 65 - 99 mg/dL   BUN 17 6 - 20 mg/dL   Creatinine, Ser 1.06 0.61 - 1.24 mg/dL   Calcium 9.1 8.9 - 10.3 mg/dL   Total Protein 7.3 6.5 - 8.1 g/dL   Albumin 4.1 3.5 - 5.0 g/dL   AST 55 (H) 15 - 41 U/L   ALT 19 17 - 63 U/L   Alkaline Phosphatase 55 38 - 126 U/L   Total Bilirubin 0.7 0.3 - 1.2 mg/dL   GFR calc non Af Amer >60 >60 mL/min   GFR calc Af Amer >60 >60 mL/min   Anion gap 6 5 - 15  Troponin I  Result Value Ref Range   Troponin I <0.03 <0.03 ng/mL  Troponin I  Result Value Ref Range   Troponin I <0.03 <0.03 ng/mL   Dg Chest 2 View  Result Date: 11/17/2016 CLINICAL DATA:  Cough with left-sided  wheezing EXAM: CHEST  2 VIEW COMPARISON:  07/31/2015 FINDINGS: Linear atelectasis or scar at the left base. Coarse interstitial opacity bilaterally. No focal consolidation. No pleural effusion. Stable cardiomediastinal silhouette. No pneumothorax. Mild wedging of midthoracic vertebra unchanged. IMPRESSION: 1. Linear scarring or atelectasis at the bases 2. Coarse bilateral interstitial opacity likely chronic Electronically Signed   By: Donavan Foil M.D.   On: 11/17/2016 21:36   Ct Head Wo Contrast  Result Date: 11/17/2016 CLINICAL DATA:  Intermittent lightheadedness EXAM: CT HEAD WITHOUT CONTRAST TECHNIQUE: Contiguous axial images were obtained from the base of the skull through the vertex without intravenous contrast. COMPARISON:  None. FINDINGS: Brain: No evidence of acute infarction, hemorrhage, hydrocephalus, extra-axial collection or mass lesion/mass effect. Chronic bilateral basal ganglia lacunar infarcts. Vascular: No hyperdense vessel or unexpected calcification. Skull: Normal. Negative for fracture or focal lesion. Sinuses/Orbits: Partial opacification of the  right maxillary sinus. Mastoid air cells are clear. Other: None. IMPRESSION: No evidence of acute intracranial abnormality. Chronic bilateral basal ganglia lacunar infarcts. Electronically Signed   By: Julian Hy M.D.   On: 11/17/2016 17:58

## 2016-11-17 NOTE — ED Triage Notes (Signed)
Wife states patient has had 3 "spells" in the past 3 hours. Patient questioned how he would describe this spell. States he has a brief episode of feeling a chill.

## 2016-11-17 NOTE — ED Triage Notes (Signed)
Pt presents to ED c/o "feeling funny headed"/lightheaded for periods of 15-20 seconds. Wife reports pt had 1 episode yesterday and has had 5-6 today so far. No facial droop, no slurred speech, denies visions changes, denies pain, no focal weakness noted at this time.

## 2016-11-17 NOTE — ED Notes (Addendum)
Pt and wife waiting patiently for treatment room; wife states pt has had 5 episodes since the arrived where he's completely forgotten something he just told her; CT results and lab work completed;

## 2016-11-19 ENCOUNTER — Encounter: Payer: Self-pay | Admitting: Internal Medicine

## 2016-11-19 ENCOUNTER — Ambulatory Visit (INDEPENDENT_AMBULATORY_CARE_PROVIDER_SITE_OTHER): Payer: Medicare Other | Admitting: Internal Medicine

## 2016-11-19 ENCOUNTER — Telehealth: Payer: Self-pay | Admitting: Internal Medicine

## 2016-11-19 DIAGNOSIS — J4520 Mild intermittent asthma, uncomplicated: Secondary | ICD-10-CM | POA: Diagnosis not present

## 2016-11-19 DIAGNOSIS — R42 Dizziness and giddiness: Secondary | ICD-10-CM

## 2016-11-19 DIAGNOSIS — R7989 Other specified abnormal findings of blood chemistry: Secondary | ICD-10-CM

## 2016-11-19 DIAGNOSIS — G4730 Sleep apnea, unspecified: Secondary | ICD-10-CM

## 2016-11-19 DIAGNOSIS — R945 Abnormal results of liver function studies: Secondary | ICD-10-CM

## 2016-11-19 DIAGNOSIS — K219 Gastro-esophageal reflux disease without esophagitis: Secondary | ICD-10-CM | POA: Diagnosis not present

## 2016-11-19 DIAGNOSIS — E7800 Pure hypercholesterolemia, unspecified: Secondary | ICD-10-CM | POA: Diagnosis not present

## 2016-11-19 MED ORDER — OMEPRAZOLE 20 MG PO CPDR: 20.0000 mg | DELAYED_RELEASE_CAPSULE | Freq: Two times a day (BID) | ORAL | 2 refills | Status: DC

## 2016-11-19 NOTE — Telephone Encounter (Signed)
See if he can come in today at 4:30.  Work in for this.  Thanks.

## 2016-11-19 NOTE — Telephone Encounter (Signed)
Scheduled for 430 as requested, thanks

## 2016-11-19 NOTE — Progress Notes (Signed)
Patient ID: [name]., male   DOB: Jun 17, 1945, 71 y.o.   MRN: [id number]   Subjective:    Patient ID: [name]., male    DOB: May 10, 1945, 71 y.o.   MRN: [id number]  HPI  Patient here for ER follow up.  He reports he was out Friday evening trimming shrubs.  Bend down on his hands and knees to pick up some limbs and had an odd sensation.  Lasted approximately 15 seconds.  No dizziness.  No light headedness.  Went inside.  Took a shower.  Ate.  No other episodes that evening.  The following day he was sitting in his recliner looking at videos on his phone.  Had another similar episode.  States occurred approximately q 30 minutes.  Had approximately 10 episodes.  Hard to describe sensation.  Would peak at about 4-5 seconds and then taper to normal after about 5 seconds.  He then would feel this sensation down bilateral (lateral) chest.  No chest pain.  No sob.  No headache or dizziness.  No nausea or vomiting. No abdominal pain.  Bowels moving.  No diarrhea.  Went to ER.  Had 6 more episodes while waiting to be seen.  Had one episode while hooked up to the monitor.  All similar.  No LOC.  No focal weakness.  He has been eating and drinking.  He also reports having a cough for the last month.  Occasionally productive of yellow sputum.  Some hoarseness.  Had been taking a large dose of vitamin c.  Increased acid reflux.  Stopped the increased vitamin C several days ago.  On questioning, he reports he had a seizure when he was 71 years old.  States it occurred after inhaling glue from a model car - accidentally.  Was evaluated and placed on phenobarbital.  On for a couple of years and was then stopped.  He had one other episode when he was 23, where he had increased exposure to varnish.  No medication since he was a teenager.  No other episodes or seizures.  In ER had labs and EKG.  Also had head CT as outlined.  Discharged to f/u here.  Had one episode yesterday, but has had none since.  Feels  essentially back to normal.     Past Medical History:  Diagnosis Date  . Asthma   . Bladder outlet obstruction   . Crohn's disease ([place])    appendix, s/p appendectomy  . GERD (gastroesophageal reflux disease)    schatzki ring  . Hypercholesterolemia   . Restless leg syndrome   . Seizure disorder [facility])    age 40, previously on phenobarbital  . Skin cancer, basal cell   . Sleep apnea    Past Surgical History:  Procedure Laterality Date  . APPENDECTOMY  1992   lysis of adhesions (bowel blockage)  . inguinal hernia surgery  1999   left  . KNEE ARTHROSCOPY  1982  . [place]  . TONSILLECTOMY AND ADENOIDECTOMY  1956   Family History  Problem Relation Age of Onset  . Heart disease Father   . Congestive Heart Failure Mother   . Colon cancer Neg Hx   . Prostate cancer Neg Hx    Social History   Social History  . Marital status: Married    Spouse name: N/A  . Number of children: 0  . Years of education: N/A   Social History Main Topics  . Smoking status: Never  Smoker  . Smokeless tobacco: Never Used  . Alcohol use No  . Drug use: No  . Sexual activity: Not Currently   Other Topics Concern  . None   Social History Narrative  . None    Outpatient Encounter Prescriptions as of 11/19/2016  Medication Sig  . albuterol (PROVENTIL HFA;VENTOLIN HFA) 108 (90 BASE) MCG/ACT inhaler Inhale 2 puffs into the lungs every 6 (six) hours as needed for wheezing or shortness of breath.  Marland Kitchen CALCIUM CITRATE PO Take 1 tablet by mouth daily.  . Cholecalciferol (VITAMIN D-3) 1000 UNITS CAPS Take 1 capsule by mouth daily.  . finasteride (PROSCAR) 5 MG tablet Take 5 mg by mouth daily.  . Fluticasone-Salmeterol (ADVAIR DISKUS) 100-50 MCG/DOSE AEPB INHALE 1 PUFF EVERY DAY  . Magnesium 250 MG TABS Two tablets q day  . omeprazole (PRILOSEC) 20 MG capsule Take 1 capsule (20 mg total) by mouth 2 (two) times daily before a meal.  . pramipexole (MIRAPEX) 0.25 MG tablet Take 1.5  tablets (0.375 mg total) by mouth daily.  . pravastatin (PRAVACHOL) 10 MG tablet TAKE 1 TABLET (10 MG TOTAL) BY MOUTH DAILY.  . [DISCONTINUED] omeprazole (PRILOSEC) 20 MG capsule Take 20 mg by mouth daily.  Marland Kitchen azithromycin (ZITHROMAX Z-PAK) 250 MG tablet Take 2 tablets (500 mg) on  Day 1,  followed by 1 tablet (250 mg) once daily on Days 2 through 5. (Patient not taking: Reported on 11/19/2016)   No facility-administered encounter medications on file as of 11/19/2016.     Review of Systems  Constitutional: Negative for appetite change and fever.  HENT: Negative for congestion and sinus pressure.   Respiratory: Positive for cough. Negative for chest tightness and shortness of breath.   Cardiovascular: Negative for chest pain, palpitations and leg swelling.  Gastrointestinal: Negative for abdominal pain, diarrhea, nausea and vomiting.  Genitourinary: Negative for difficulty urinating and dysuria.  Musculoskeletal: Negative for joint swelling and myalgias.  Skin: Negative for color change and rash.  Neurological: Negative for dizziness, light-headedness and headaches.  Psychiatric/Behavioral: Negative for agitation and dysphoric mood.       Objective:    Physical Exam  Constitutional: He appears well-developed and well-nourished. No distress.  HENT:  Nose: Nose normal.  Mouth/Throat: Oropharynx is clear and moist.  Neck: Neck supple. No thyromegaly present.  Cardiovascular: Normal rate and regular rhythm.   Pulmonary/Chest: Effort normal and breath sounds normal. No respiratory distress.  Abdominal: Soft. Bowel sounds are normal. There is no tenderness.  Musculoskeletal: He exhibits no edema or tenderness.  Lymphadenopathy:    He has no cervical adenopathy.  Skin: No rash noted. No erythema.  Psychiatric: He has a normal mood and affect. His behavior is normal.    BP 120/60 (BP Location: Left Arm, Patient Position: Sitting, Cuff Size: Normal)   Pulse 62   Temp 98.6 F (37 C)  (Oral)   Resp 12   Wt 233 lb 6.4 oz (105.9 kg)   SpO2 95%   BMI 34.97 kg/m  Wt Readings from Last 3 Encounters:  11/19/16 233 lb 6.4 oz (105.9 kg)  11/17/16 225 lb (102.1 kg)  09/13/16 227 lb 12.8 oz (103.3 kg)     Lab Results  Component Value Date   WBC 10.5 11/17/2016   HGB 15.2 11/17/2016   HCT 44.4 11/17/2016   PLT 229 11/17/2016   GLUCOSE 142 (H) 11/17/2016   CHOL 178 09/07/2016   TRIG 99.0 09/07/2016   HDL 49.50 09/07/2016   LDLDIRECT 154.7 05/22/2013  LDLCALC 109 (H) 09/07/2016   ALT 19 11/17/2016   AST 55 (H) 11/17/2016   NA 140 11/17/2016   K 4.3 11/17/2016   CL 105 11/17/2016   CREATININE 1.06 11/17/2016   BUN 17 11/17/2016   CO2 29 11/17/2016   TSH 1.89 03/15/2016   INR 1.00 11/17/2016    Dg Chest 2 View  Result Date: 11/17/2016 CLINICAL DATA:  Cough with left-sided wheezing EXAM: CHEST  2 VIEW COMPARISON:  07/31/2015 FINDINGS: Linear atelectasis or scar at the left base. Coarse interstitial opacity bilaterally. No focal consolidation. No pleural effusion. Stable cardiomediastinal silhouette. No pneumothorax. Mild wedging of midthoracic vertebra unchanged. IMPRESSION: 1. Linear scarring or atelectasis at the bases 2. Coarse bilateral interstitial opacity likely chronic Electronically Signed   By: Donavan Foil M.D.   On: 11/17/2016 21:36   Ct Head Wo Contrast  Result Date: 11/17/2016 CLINICAL DATA:  Intermittent lightheadedness EXAM: CT HEAD WITHOUT CONTRAST TECHNIQUE: Contiguous axial images were obtained from the base of the skull through the vertex without intravenous contrast. COMPARISON:  None. FINDINGS: Brain: No evidence of acute infarction, hemorrhage, hydrocephalus, extra-axial collection or mass lesion/mass effect. Chronic bilateral basal ganglia lacunar infarcts. Vascular: No hyperdense vessel or unexpected calcification. Skull: Normal. Negative for fracture or focal lesion. Sinuses/Orbits: Partial opacification of the right maxillary sinus. Mastoid  air cells are clear. Other: None. IMPRESSION: No evidence of acute intracranial abnormality. Chronic bilateral basal ganglia lacunar infarcts. Electronically Signed   By: Julian Hy M.D.   On: 11/17/2016 17:58       Assessment & Plan:   Problem List Items Addressed This Visit    Abnormal liver function test    Stable on recent check in ER.        Asthma    On advair.  Breathing overall stable.  Cough as outlined.  cxr as outlined.  Treat acid reflux.  Hold abx.  Follow.        GERD (gastroesophageal reflux disease)    Worsening acid reflux recently.  Has stopped the increased vitamin C.  Feel this is contributing to the cough and hoarseness.  cxr as outlined.  Has not taken the zpak.  Will increased prilosec to bid.  Hold abx.  Follow closely.        Relevant Medications   omeprazole (PRILOSEC) 20 MG capsule   Hypercholesterolemia    Has been trying to control with diet and exercise.  Follow lipid panel.       Intermittent lightheadedness    Had the episodes as outlined.  Multiple episodes.  Not really described as a light headedness or dizziness.  Hard for him to describe.  No associated symptoms.  ER evaluation as outlined.  Unclear etiology.  Cardiac w/up to date unrevealing.  Had episode while on monitor in ER and per wife no change in vitals or heart rhythm.  Discussed possible underlying neurologic etiology.  (i.e., TIA, seizure, etc).  CT as outlined. No acute abnormality.  Did mention chronic bilateral basal ganglia infarcts.  Will start aspirin daily.  Rest.  Consider MRI.  D/w neurology regarding further w/up.  Pt in agreement.        Sleep apnea    Continue CPAP.         I spent 40 minutes with the patient and more than 50% of the time was spent in consultation regarding the above.  Time spent discussing previous symptoms and "spells" and ER evaluation and w/up.  Time also spent in discussing treatment  and further evaluation and w/up.     Einar Pheasant,  MD

## 2016-11-19 NOTE — Progress Notes (Signed)
Pre-visit discussion using our clinic review tool. No additional management support is needed unless otherwise documented below in the visit note.  

## 2016-11-19 NOTE — Patient Instructions (Addendum)
Increase prilosec to twice a day (take 30 minutes before breakfast and 30 minutes before your evening meal).    Start coated baby aspirin 81mg  daily

## 2016-11-19 NOTE — Telephone Encounter (Signed)
Pt called and stated that he was in the ED he was having sensations in his head. He felt that he was having a stroke. Pt was advised to follow up with Dr. Nicki Reaper today. Please advise, thank you!  Call pt @ [phone]

## 2016-11-19 NOTE — Telephone Encounter (Signed)
Can you put this in for me? Thanks

## 2016-11-19 NOTE — Telephone Encounter (Signed)
Called patient states that he went to ED on Saturday he was having episodes where he was having a build up of pressure in his head that builds up for a few seconds then feels tingling down both sides of his chest. He had several episodes before he went to ed and about 7 while there. He states that after he left ED he only have one episode yesterday. He did not start abx that were given to him at ED states that it has never been helpful in the past and ED told him to wait and see you about it if he wanted anything better. I do not see any opening in you schedule please advise.

## 2016-11-20 ENCOUNTER — Encounter: Payer: Self-pay | Admitting: Internal Medicine

## 2016-11-20 DIAGNOSIS — R42 Dizziness and giddiness: Secondary | ICD-10-CM | POA: Insufficient documentation

## 2016-11-20 NOTE — Assessment & Plan Note (Signed)
Continue CPAP.  

## 2016-11-20 NOTE — Assessment & Plan Note (Signed)
Has been trying to control with diet and exercise.  Follow lipid panel.

## 2016-11-20 NOTE — Assessment & Plan Note (Signed)
Stable on recent check in ER.

## 2016-11-20 NOTE — Assessment & Plan Note (Signed)
Worsening acid reflux recently.  Has stopped the increased vitamin C.  Feel this is contributing to the cough and hoarseness.  cxr as outlined.  Has not taken the zpak.  Will increased prilosec to bid.  Hold abx.  Follow closely.

## 2016-11-20 NOTE — Assessment & Plan Note (Signed)
On advair.  Breathing overall stable.  Cough as outlined.  cxr as outlined.  Treat acid reflux.  Hold abx.  Follow.

## 2016-11-20 NOTE — Assessment & Plan Note (Signed)
Had the episodes as outlined.  Multiple episodes.  Not really described as a light headedness or dizziness.  Hard for him to describe.  No associated symptoms.  ER evaluation as outlined.  Unclear etiology.  Cardiac w/up to date unrevealing.  Had episode while on monitor in ER and per wife no change in vitals or heart rhythm.  Discussed possible underlying neurologic etiology.  (i.e., TIA, seizure, etc).  CT as outlined. No acute abnormality.  Did mention chronic bilateral basal ganglia infarcts.  Will start aspirin daily.  Rest.  Consider MRI.  D/w neurology regarding further w/up.  Pt in agreement.

## 2016-11-22 DIAGNOSIS — G2581 Restless legs syndrome: Secondary | ICD-10-CM | POA: Diagnosis not present

## 2016-11-22 DIAGNOSIS — Z87898 Personal history of other specified conditions: Secondary | ICD-10-CM | POA: Diagnosis not present

## 2016-11-22 DIAGNOSIS — G4733 Obstructive sleep apnea (adult) (pediatric): Secondary | ICD-10-CM | POA: Diagnosis not present

## 2016-11-22 DIAGNOSIS — R202 Paresthesia of skin: Secondary | ICD-10-CM | POA: Diagnosis not present

## 2016-11-22 DIAGNOSIS — Z8673 Personal history of transient ischemic attack (TIA), and cerebral infarction without residual deficits: Secondary | ICD-10-CM | POA: Diagnosis not present

## 2016-11-26 ENCOUNTER — Other Ambulatory Visit: Payer: Self-pay | Admitting: Neurology

## 2016-11-26 DIAGNOSIS — Z87898 Personal history of other specified conditions: Secondary | ICD-10-CM

## 2016-12-01 ENCOUNTER — Ambulatory Visit
Admission: RE | Admit: 2016-12-01 | Discharge: 2016-12-01 | Disposition: A | Discharge status: Home / Self Care | Payer: Medicare Other | Source: Ambulatory Visit | Attending: Neurology | Admitting: Neurology

## 2016-12-01 DIAGNOSIS — R202 Paresthesia of skin: Secondary | ICD-10-CM | POA: Insufficient documentation

## 2016-12-01 DIAGNOSIS — J320 Chronic maxillary sinusitis: Secondary | ICD-10-CM | POA: Diagnosis not present

## 2016-12-01 DIAGNOSIS — Z87898 Personal history of other specified conditions: Secondary | ICD-10-CM | POA: Diagnosis not present

## 2016-12-01 DIAGNOSIS — R569 Unspecified convulsions: Secondary | ICD-10-CM | POA: Insufficient documentation

## 2016-12-01 MED ORDER — GADOBENATE DIMEGLUMINE 529 MG/ML IV SOLN
20.0000 mL | Freq: Once | INTRAVENOUS | Status: AC | PRN
  Administered 2016-12-01: 20 mL via INTRAVENOUS

## 2016-12-03 ENCOUNTER — Telehealth: Payer: Self-pay

## 2016-12-03 MED ORDER — FLUTICASONE-SALMETEROL 100-50 MCG/DOSE IN AEPB: INHALATION_SPRAY | RESPIRATORY_TRACT | 3 refills | Status: DC

## 2016-12-03 NOTE — Telephone Encounter (Signed)
Fax received from [facility] wanting refill on Advair inh 100/50. I do not see on med list or on last note from 11/19/16. If ok to give they would like 90 day

## 2016-12-03 NOTE — Telephone Encounter (Signed)
I ok'd order for advair (3 months with 3 refills).  I sent to [facility].

## 2016-12-21 DIAGNOSIS — G4733 Obstructive sleep apnea (adult) (pediatric): Secondary | ICD-10-CM | POA: Diagnosis not present

## 2016-12-21 DIAGNOSIS — Z8673 Personal history of transient ischemic attack (TIA), and cerebral infarction without residual deficits: Secondary | ICD-10-CM | POA: Diagnosis not present

## 2016-12-21 DIAGNOSIS — G2581 Restless legs syndrome: Secondary | ICD-10-CM | POA: Diagnosis not present

## 2016-12-21 DIAGNOSIS — E559 Vitamin D deficiency, unspecified: Secondary | ICD-10-CM | POA: Diagnosis not present

## 2016-12-21 DIAGNOSIS — R202 Paresthesia of skin: Secondary | ICD-10-CM | POA: Diagnosis not present

## 2016-12-25 DIAGNOSIS — R569 Unspecified convulsions: Secondary | ICD-10-CM | POA: Diagnosis not present

## 2016-12-29 IMAGING — CR DG CHEST 2V
2 series · 2 of 2 positions shown · non-contrast
Comparison: 01/28/2013.

CLINICAL DATA: Fever.  Productive cough .

EXAM:
CHEST  2 VIEW

[view not recorded (1 of 2)]
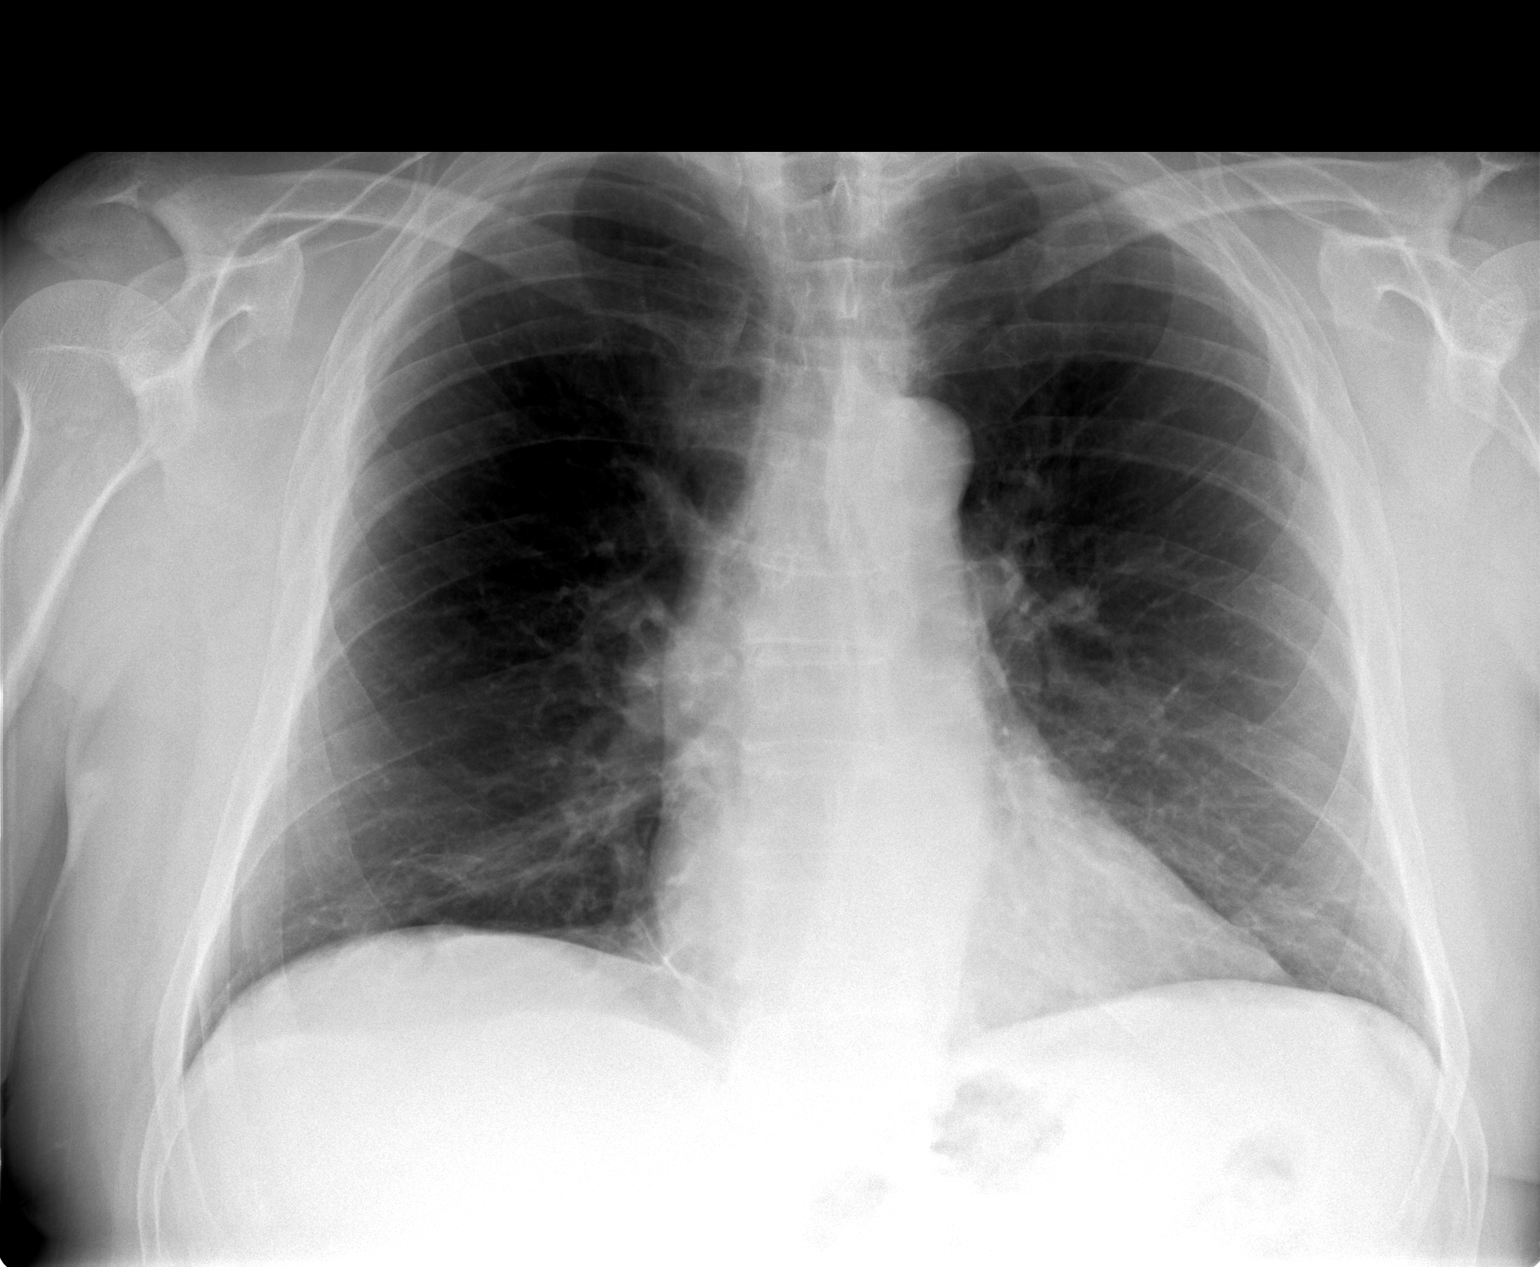

[view not recorded (2 of 2)]
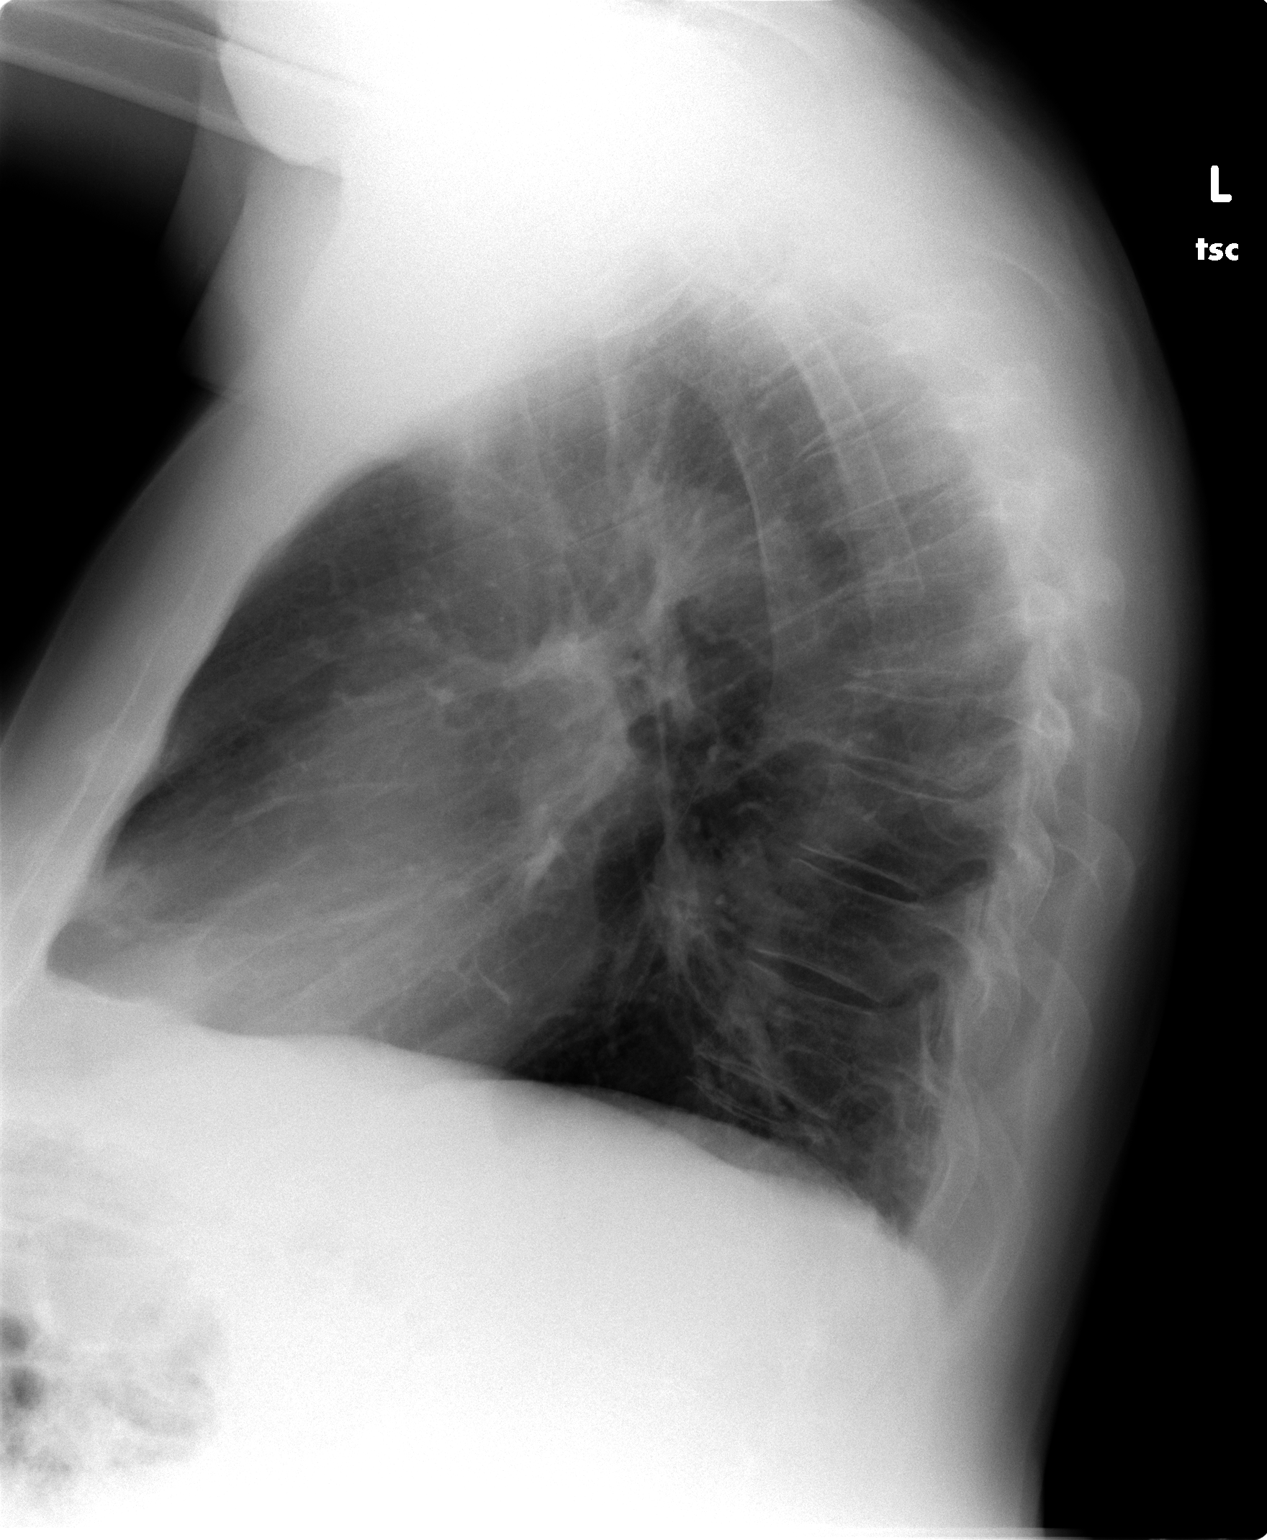

[2 of 2 positions shown; findings below may reference images not displayed]

FINDINGS: Mediastinum and hilar structures normal. Heart size normal. Mild
right base subsegmental atelectasis versus infiltrate. No pleural
effusion or pneumothorax. No acute bony abnormality .
IMPRESSION: Mild right base subsegmental atelectasis versus infiltrate.

## 2017-01-09 DIAGNOSIS — G2581 Restless legs syndrome: Secondary | ICD-10-CM | POA: Diagnosis not present

## 2017-01-09 DIAGNOSIS — R202 Paresthesia of skin: Secondary | ICD-10-CM | POA: Diagnosis not present

## 2017-01-09 DIAGNOSIS — Z87898 Personal history of other specified conditions: Secondary | ICD-10-CM | POA: Diagnosis not present

## 2017-02-06 DIAGNOSIS — L570 Actinic keratosis: Secondary | ICD-10-CM | POA: Diagnosis not present

## 2017-02-08 ENCOUNTER — Encounter: Payer: Federal, State, Local not specified - PPO | Admitting: Internal Medicine

## 2017-02-08 ENCOUNTER — Other Ambulatory Visit: Payer: Self-pay

## 2017-02-08 MED ORDER — OMEPRAZOLE 20 MG PO CPDR: 20.0000 mg | DELAYED_RELEASE_CAPSULE | Freq: Two times a day (BID) | ORAL | 0 refills | Status: DC

## 2017-02-14 DIAGNOSIS — N401 Enlarged prostate with lower urinary tract symptoms: Secondary | ICD-10-CM | POA: Diagnosis not present

## 2017-02-14 DIAGNOSIS — Z6832 Body mass index (BMI) 32.0-32.9, adult: Secondary | ICD-10-CM | POA: Diagnosis not present

## 2017-02-14 DIAGNOSIS — R972 Elevated prostate specific antigen [PSA]: Secondary | ICD-10-CM | POA: Diagnosis not present

## 2017-02-14 DIAGNOSIS — R339 Retention of urine, unspecified: Secondary | ICD-10-CM | POA: Diagnosis not present

## 2017-02-15 ENCOUNTER — Ambulatory Visit (INDEPENDENT_AMBULATORY_CARE_PROVIDER_SITE_OTHER): Payer: Medicare Other | Admitting: Internal Medicine

## 2017-02-15 ENCOUNTER — Encounter: Payer: Self-pay | Admitting: Internal Medicine

## 2017-02-15 VITALS — BP 112/62 | HR 69 | Temp 98.6°F | Resp 14 | Ht 69.0 in | Wt 234.2 lb

## 2017-02-15 DIAGNOSIS — G4730 Sleep apnea, unspecified: Secondary | ICD-10-CM

## 2017-02-15 DIAGNOSIS — J4520 Mild intermittent asthma, uncomplicated: Secondary | ICD-10-CM

## 2017-02-15 DIAGNOSIS — Z0000 Encounter for general adult medical examination without abnormal findings: Secondary | ICD-10-CM | POA: Diagnosis not present

## 2017-02-15 DIAGNOSIS — E7800 Pure hypercholesterolemia, unspecified: Secondary | ICD-10-CM

## 2017-02-15 DIAGNOSIS — R945 Abnormal results of liver function studies: Secondary | ICD-10-CM

## 2017-02-15 DIAGNOSIS — K219 Gastro-esophageal reflux disease without esophagitis: Secondary | ICD-10-CM | POA: Diagnosis not present

## 2017-02-15 DIAGNOSIS — G2581 Restless legs syndrome: Secondary | ICD-10-CM | POA: Diagnosis not present

## 2017-02-15 DIAGNOSIS — M810 Age-related osteoporosis without current pathological fracture: Secondary | ICD-10-CM

## 2017-02-15 DIAGNOSIS — Z23 Encounter for immunization: Secondary | ICD-10-CM | POA: Diagnosis not present

## 2017-02-15 DIAGNOSIS — R7989 Other specified abnormal findings of blood chemistry: Secondary | ICD-10-CM

## 2017-02-15 NOTE — Progress Notes (Signed)
Patient ID: Joseph Glimpse., male   DOB: 1945/10/03, 71 y.o.   MRN: [id number]   Subjective:    Patient ID: Joseph Glimpse., male    DOB: [birth year], 71 y.o.   MRN: [id number]  HPI  Patient here for his physical exam.  States he is doing well.  Tries to stay active.  Saw Dr Manuella Ghazi.  Had EEG.  Revealed presence of frequent generalized spike and wave discharges last 1-3 seconds and right frontal predominant discharges.  Was offered anti seizure medication.  Mr Trejos preferred not to start medication.  Has had no further episodes.  Continue to f/u with neurology.  States he feels good.  No chest pain. No sob.  No acid reflux.  No abdominal pain or cramping.  Bowels moving.  Saw Dr Jacqlyn Larsen 02/14/17.  Stable.     Past Medical History:  Diagnosis Date  . Asthma   . Bladder outlet obstruction   . Crohn's disease ([place])    appendix, s/p appendectomy  . GERD (gastroesophageal reflux disease)    schatzki ring  . Hypercholesterolemia   . Restless leg syndrome   . Seizure disorder [facility])    age 70, previously on phenobarbital  . Skin cancer, basal cell   . Sleep apnea    Past Surgical History:  Procedure Laterality Date  . APPENDECTOMY  1992   lysis of adhesions (bowel blockage)  . inguinal hernia surgery  1999   left  . KNEE ARTHROSCOPY  1982  . [place]  . TONSILLECTOMY AND ADENOIDECTOMY  1956   Family History  Problem Relation Age of Onset  . Heart disease Father   . Congestive Heart Failure Mother   . Colon cancer Neg Hx   . Prostate cancer Neg Hx    Social History   Social History  . Marital status: Married    Spouse name: N/A  . Number of children: 0  . Years of education: N/A   Social History Main Topics  . Smoking status: Never Smoker  . Smokeless tobacco: Never Used  . Alcohol use No  . Drug use: No  . Sexual activity: Not Currently   Other Topics Concern  . None   Social History Narrative  . None    Outpatient Encounter Prescriptions as of  02/15/2017  Medication Sig  . albuterol (PROVENTIL HFA;VENTOLIN HFA) 108 (90 BASE) MCG/ACT inhaler Inhale 2 puffs into the lungs every 6 (six) hours as needed for wheezing or shortness of breath.  Marland Kitchen CALCIUM CITRATE PO Take 1 tablet by mouth daily.  . Cholecalciferol (VITAMIN D-3) 1000 UNITS CAPS Take 1 capsule by mouth daily.  . finasteride (PROSCAR) 5 MG tablet Take 5 mg by mouth daily.  . Fluticasone-Salmeterol (ADVAIR DISKUS) 100-50 MCG/DOSE AEPB INHALE 1 PUFF EVERY DAY  . Magnesium 250 MG TABS Two tablets q day  . omeprazole (PRILOSEC) 20 MG capsule Take 1 capsule (20 mg total) by mouth 2 (two) times daily before a meal.  . pramipexole (MIRAPEX) 0.25 MG tablet Take 1.5 tablets (0.375 mg total) by mouth daily.  . pravastatin (PRAVACHOL) 10 MG tablet TAKE 1 TABLET (10 MG TOTAL) BY MOUTH DAILY.  . [DISCONTINUED] azithromycin (ZITHROMAX Z-PAK) 250 MG tablet Take 2 tablets (500 mg) on  Day 1,  followed by 1 tablet (250 mg) once daily on Days 2 through 5. (Patient not taking: Reported on 11/19/2016)  . [DISCONTINUED] omeprazole (PRILOSEC) 20 MG capsule Take 1 capsule (20 mg total) by mouth  2 (two) times daily before a meal.  . [DISCONTINUED] pramipexole (MIRAPEX) 0.25 MG tablet Take 1.5 tablets (0.375 mg total) by mouth daily.  . [DISCONTINUED] pravastatin (PRAVACHOL) 10 MG tablet TAKE 1 TABLET (10 MG TOTAL) BY MOUTH DAILY.   No facility-administered encounter medications on file as of 02/15/2017.     Review of Systems  Constitutional: Negative for appetite change and unexpected weight change.  HENT: Negative for congestion and sinus pressure.   Eyes: Negative for pain and visual disturbance.  Respiratory: Negative for cough, chest tightness and shortness of breath.   Cardiovascular: Negative for chest pain, palpitations and leg swelling.  Gastrointestinal: Negative for abdominal pain, diarrhea, nausea and vomiting.  Genitourinary: Negative for difficulty urinating and dysuria.    Musculoskeletal: Negative for back pain and joint swelling.  Skin: Negative for color change and rash.  Neurological: Negative for dizziness, light-headedness and headaches.  Hematological: Negative for adenopathy. Does not bruise/bleed easily.  Psychiatric/Behavioral: Negative for agitation and dysphoric mood.       Objective:     Blood pressure rechecked by me:  118/70  Physical Exam  Constitutional: He is oriented to person, place, and time. He appears well-developed and well-nourished. No distress.  HENT:  Head: Normocephalic and atraumatic.  Nose: Nose normal.  Mouth/Throat: Oropharynx is clear and moist. No oropharyngeal exudate.  Eyes: Conjunctivae are normal. Right eye exhibits no discharge. Left eye exhibits no discharge.  Neck: Neck supple. No thyromegaly present.  Cardiovascular: Normal rate and regular rhythm.   Pulmonary/Chest: Breath sounds normal. No respiratory distress. He has no wheezes.  Abdominal: Soft. Bowel sounds are normal. There is no tenderness.  Genitourinary:  Genitourinary Comments: Sees Dr Jacqlyn Larsen.   Musculoskeletal: He exhibits no edema or tenderness.  Lymphadenopathy:    He has no cervical adenopathy.  Neurological: He is alert and oriented to person, place, and time.  Skin: Skin is warm and dry. No rash noted. No erythema.  Psychiatric: He has a normal mood and affect. His behavior is normal.    BP 112/62 (BP Location: Left Arm, Patient Position: Sitting, Cuff Size: Large)   Pulse 69   Temp 98.6 F (37 C) (Oral)   Resp 14   Ht 5\' 9"  (1.753 m)   Wt 234 lb 3.2 oz (106.2 kg)   SpO2 97%   BMI 34.59 kg/m  Wt Readings from Last 3 Encounters:  02/15/17 234 lb 3.2 oz (106.2 kg)  11/19/16 233 lb 6.4 oz (105.9 kg)  11/17/16 225 lb (102.1 kg)     Lab Results  Component Value Date   WBC 10.5 11/17/2016   HGB 15.2 11/17/2016   HCT 44.4 11/17/2016   PLT 229 11/17/2016   GLUCOSE 142 (H) 11/17/2016   CHOL 178 09/07/2016   TRIG 99.0 09/07/2016    HDL 49.50 09/07/2016   LDLDIRECT 154.7 05/22/2013   LDLCALC 109 (H) 09/07/2016   ALT 19 11/17/2016   AST 55 (H) 11/17/2016   NA 140 11/17/2016   K 4.3 11/17/2016   CL 105 11/17/2016   CREATININE 1.06 11/17/2016   BUN 17 11/17/2016   CO2 29 11/17/2016   TSH 1.89 03/15/2016   INR 1.00 11/17/2016    Mr Brain W Wo Contrast  Result Date: 12/01/2016 CLINICAL DATA:  History of seizure.  Head seizures as a youth. EXAM: MRI HEAD WITHOUT AND WITH CONTRAST TECHNIQUE: Multiplanar, multiecho pulse sequences of the brain and surrounding structures were obtained without and with intravenous contrast. CONTRAST:  57mL MULTIHANCE GADOBENATE DIMEGLUMINE  529 MG/ML IV SOLN COMPARISON:  Head CT 11/17/2016 FINDINGS: Brain: No explanation for seizure. No cortical scarring or mass. Accounting for remnant cysts, the hippocampi are symmetric and unremarkable. Normal brain volume. Very mild hyperintensity in the cerebral white matter, usually attributed to remote microvascular insults. Lacunar infarcts described on previous head CT are instead dilated perivascular spaces. No specific demyelinating pattern. Vascular: Negative. Skull and upper cervical spine: Negative for marrow lesion Sinuses/Orbits: Focal right maxillary sinusitis with T2 hypointense fluid level that may be proteinaceous. Bilateral cataract resection. IMPRESSION: 1. No explanation for seizure.  Unremarkable brain MRI. 2. Structures described is lacunar infarcts on head CT 11/17/2016 are instead dilated perivascular spaces. 3. Right maxillary sinusitis. Electronically Signed   By: Monte Fantasia M.D.   On: 12/01/2016 15:19       Assessment & Plan:   Problem List Items Addressed This Visit    Abnormal liver function test    Low cholesterol diet and weight loss.  Follow liver panel.        Asthma    Breathing stable on current regimen.        GERD (gastroesophageal reflux disease)    Controlled on omeprazole.  Taking bid.  Given controlled,  will try to decrease to q day.  Follow.        Relevant Medications   omeprazole (PRILOSEC) 20 MG capsule   Health care maintenance    Physical today 02/15/17.  Sees Dr Jacqlyn Larsen.  Evaluated 02/14/17.  Colonoscopy 2013.        Hypercholesterolemia    Low cholesterol diet and exercise.  Follow lipid panel.        Relevant Medications   pravastatin (PRAVACHOL) 10 MG tablet   Other Relevant Orders   Lipid panel   TSH   Hepatic function panel   Basic metabolic panel   Osteoporosis    Continue dietary calcium and weight bearing exercises.  Follow vitamin D level.        Relevant Orders   VITAMIN D 25 Hydroxy (Vit-D Deficiency, Fractures)   Restless leg syndrome    Stable on mirapex.  Follow.        Sleep apnea    Uses cpap regularly.  Follow.         Other Visit Diagnoses    Routine general medical examination at a health care facility    -  Primary   Encounter for immunization       Relevant Orders   Flu vaccine HIGH DOSE PF (Completed)       Einar Pheasant, MD

## 2017-02-15 NOTE — Assessment & Plan Note (Signed)
Physical today 02/15/17.  Sees Dr Jacqlyn Larsen.  Evaluated 02/14/17.  Colonoscopy 2013.

## 2017-02-17 ENCOUNTER — Encounter: Payer: Self-pay | Admitting: Internal Medicine

## 2017-02-17 MED ORDER — OMEPRAZOLE 20 MG PO CPDR: 20.0000 mg | DELAYED_RELEASE_CAPSULE | Freq: Two times a day (BID) | ORAL | 0 refills | Status: DC

## 2017-02-17 MED ORDER — PRAMIPEXOLE DIHYDROCHLORIDE 0.25 MG PO TABS: 0.3750 mg | ORAL_TABLET | Freq: Every day | ORAL | 3 refills | Status: DC

## 2017-02-17 MED ORDER — PRAVASTATIN SODIUM 10 MG PO TABS: ORAL_TABLET | ORAL | 3 refills | Status: DC

## 2017-02-17 NOTE — Assessment & Plan Note (Addendum)
Controlled on omeprazole.  Taking bid.  Given controlled, will try to decrease to q day.  Follow.

## 2017-02-17 NOTE — Assessment & Plan Note (Signed)
Uses cpap regularly.  Follow.  

## 2017-02-17 NOTE — Assessment & Plan Note (Signed)
Continue dietary calcium and weight bearing exercises.  Follow vitamin D level.

## 2017-02-17 NOTE — Assessment & Plan Note (Signed)
Low cholesterol diet and weight loss.  Follow liver panel.

## 2017-02-17 NOTE — Assessment & Plan Note (Signed)
Stable on mirapex.  Follow.

## 2017-02-17 NOTE — Assessment & Plan Note (Signed)
Breathing stable on current regimen.

## 2017-02-17 NOTE — Assessment & Plan Note (Signed)
Low cholesterol diet and exercise.  Follow lipid panel.   

## 2017-03-10 ENCOUNTER — Other Ambulatory Visit: Payer: Self-pay | Admitting: Internal Medicine

## 2017-03-15 ENCOUNTER — Other Ambulatory Visit (INDEPENDENT_AMBULATORY_CARE_PROVIDER_SITE_OTHER): Payer: Medicare Other

## 2017-03-15 DIAGNOSIS — M810 Age-related osteoporosis without current pathological fracture: Secondary | ICD-10-CM

## 2017-03-15 DIAGNOSIS — E7800 Pure hypercholesterolemia, unspecified: Secondary | ICD-10-CM

## 2017-03-15 LAB — BASIC METABOLIC PANEL
BUN: 17 mg/dL (ref 6–23)
CHLORIDE: 100 meq/L (ref 96–112)
CO2: 32 mEq/L (ref 19–32)
CREATININE: 1.22 mg/dL (ref 0.40–1.50)
Calcium: 9.6 mg/dL (ref 8.4–10.5)
GFR: 62.39 mL/min (ref >60.00)
Glucose, Bld: 93 mg/dL (ref 70–99)
Potassium: 4.2 mEq/L (ref 3.5–5.1)
Sodium: 139 mEq/L (ref 135–145)

## 2017-03-15 LAB — VITAMIN D 25 HYDROXY (VIT D DEFICIENCY, FRACTURES): VITD: 46.81 ng/mL (ref 30.00–100.00)

## 2017-03-15 LAB — HEPATIC FUNCTION PANEL
ALBUMIN: 4.2 g/dL (ref 3.5–5.2)
ALT: 16 U/L (ref 0–53)
AST: 47 U/L — AB (ref 0–37)
Alkaline Phosphatase: 59 U/L (ref 39–117)
BILIRUBIN DIRECT: 0.2 mg/dL (ref 0.0–0.3)
BILIRUBIN TOTAL: 0.9 mg/dL (ref 0.2–1.2)
TOTAL PROTEIN: 7.2 g/dL (ref 6.0–8.3)

## 2017-03-15 LAB — LIPID PANEL
CHOLESTEROL: 172 mg/dL (ref 0–200)
HDL: 46.3 mg/dL (ref >39.00)
LDL Cholesterol: 107 mg/dL — ABNORMAL HIGH (ref 0–99)
NonHDL: 125.96
TRIGLYCERIDES: 97 mg/dL (ref 0.0–149.0)
Total CHOL/HDL Ratio: 4
VLDL: 19.4 mg/dL (ref 0.0–40.0)

## 2017-03-15 LAB — TSH: TSH: 1.97 u[IU]/mL (ref 0.35–4.50)

## 2017-03-18 MED ORDER — PRAVASTATIN SODIUM 20 MG PO TABS: 20.0000 mg | ORAL_TABLET | Freq: Every day | ORAL | 1 refills | Status: DC

## 2017-03-18 NOTE — Addendum Note (Signed)
Addended by: Francella Solian on: 03/18/2017 01:40 PM   Modules accepted: Orders

## 2017-04-15 ENCOUNTER — Other Ambulatory Visit: Payer: Self-pay | Admitting: Internal Medicine

## 2017-07-11 ENCOUNTER — Other Ambulatory Visit: Payer: Self-pay | Admitting: Internal Medicine

## 2017-07-11 DIAGNOSIS — G2581 Restless legs syndrome: Secondary | ICD-10-CM | POA: Diagnosis not present

## 2017-07-11 DIAGNOSIS — Z87898 Personal history of other specified conditions: Secondary | ICD-10-CM | POA: Diagnosis not present

## 2017-08-04 ENCOUNTER — Encounter: Payer: Self-pay | Admitting: Internal Medicine

## 2017-08-05 ENCOUNTER — Other Ambulatory Visit: Payer: Self-pay | Admitting: Internal Medicine

## 2017-08-05 MED ORDER — FLUTICASONE-SALMETEROL 100-50 MCG/DOSE IN AEPB: INHALATION_SPRAY | RESPIRATORY_TRACT | 3 refills | Status: DC

## 2017-08-16 ENCOUNTER — Ambulatory Visit: Payer: Federal, State, Local not specified - PPO | Admitting: Internal Medicine

## 2017-08-23 ENCOUNTER — Ambulatory Visit (INDEPENDENT_AMBULATORY_CARE_PROVIDER_SITE_OTHER): Payer: Medicare Other | Admitting: Internal Medicine

## 2017-08-23 VITALS — BP 126/88 | HR 61 | Temp 98.2°F | Resp 18 | Wt 227.0 lb

## 2017-08-23 DIAGNOSIS — K219 Gastro-esophageal reflux disease without esophagitis: Secondary | ICD-10-CM

## 2017-08-23 DIAGNOSIS — R945 Abnormal results of liver function studies: Secondary | ICD-10-CM

## 2017-08-23 DIAGNOSIS — Z13220 Encounter for screening for lipoid disorders: Secondary | ICD-10-CM

## 2017-08-23 DIAGNOSIS — R7989 Other specified abnormal findings of blood chemistry: Secondary | ICD-10-CM

## 2017-08-23 DIAGNOSIS — M810 Age-related osteoporosis without current pathological fracture: Secondary | ICD-10-CM

## 2017-08-23 DIAGNOSIS — G4730 Sleep apnea, unspecified: Secondary | ICD-10-CM

## 2017-08-23 DIAGNOSIS — G2581 Restless legs syndrome: Secondary | ICD-10-CM

## 2017-08-23 NOTE — Progress Notes (Signed)
Patient ID: Joseph Glimpse., male   DOB: 22-Jan-1946, 72 y.o.   MRN: [id number]   Subjective:    Patient ID: Joseph Glimpse., male    DOB: 08-21-1945, 72 y.o.   MRN: [id number]  HPI  Patient here for a scheduled follow up.  He is accompanied by his wife.  History obtained from both of them.  States he feels good.  No chest pain.  No sob.  No acid reflux.  No abdominal pain.  Bowels moving.  Saw neurology 06/2017.  No seizures.  Felt stable. Recommended continuing mirapex.  Recommended f/u in 6 months.  Has lost weight.  Is eating less.  Has decreased portion sizes.     Past Medical History:  Diagnosis Date  . Asthma   . Bladder outlet obstruction   . Crohn's disease ([place])    appendix, s/p appendectomy  . GERD (gastroesophageal reflux disease)    schatzki ring  . Hypercholesterolemia   . Restless leg syndrome   . Seizure disorder [facility])    age 86, previously on phenobarbital  . Skin cancer, basal cell   . Sleep apnea    Past Surgical History:  Procedure Laterality Date  . APPENDECTOMY  1992   lysis of adhesions (bowel blockage)  . inguinal hernia surgery  1999   left  . KNEE ARTHROSCOPY  1982  . [place]  . TONSILLECTOMY AND ADENOIDECTOMY  1956   Family History  Problem Relation Age of Onset  . Heart disease Father   . Congestive Heart Failure Mother   . Colon cancer Neg Hx   . Prostate cancer Neg Hx    Social History   Socioeconomic History  . Marital status: Married    Spouse name: Not on file  . Number of children: 0  . Years of education: Not on file  . Highest education level: Not on file  Occupational History  . Not on file  Social Needs  . Financial resource strain: Not on file  . Food insecurity:    Worry: Not on file    Inability: Not on file  . Transportation needs:    Medical: Not on file    Non-medical: Not on file  Tobacco Use  . Smoking status: Never Smoker  . Smokeless tobacco: Never Used  Substance and Sexual Activity    . Alcohol use: No    Alcohol/week: 0.0 oz  . Drug use: No  . Sexual activity: Not Currently  Lifestyle  . Physical activity:    Days per week: Not on file    Minutes per session: Not on file  . Stress: Not on file  Relationships  . Social connections:    Talks on phone: Not on file    Gets together: Not on file    Attends religious service: Not on file    Active member of club or organization: Not on file    Attends meetings of clubs or organizations: Not on file    Relationship status: Not on file  Other Topics Concern  . Not on file  Social History Narrative  . Not on file    Outpatient Encounter Medications as of 08/23/2017  Medication Sig  . albuterol (PROVENTIL HFA;VENTOLIN HFA) 108 (90 BASE) MCG/ACT inhaler Inhale 2 puffs into the lungs every 6 (six) hours as needed for wheezing or shortness of breath.  Marland Kitchen CALCIUM CITRATE PO Take 1 tablet by mouth daily.  . Cholecalciferol (VITAMIN D-3) 1000 UNITS CAPS Take  1 capsule by mouth daily.  . finasteride (PROSCAR) 5 MG tablet Take 5 mg by mouth daily.  . Fluticasone-Salmeterol (ADVAIR DISKUS) 100-50 MCG/DOSE AEPB INHALE 1 PUFF EVERY DAY  . Magnesium 250 MG TABS Two tablets q day  . omeprazole (PRILOSEC) 20 MG capsule Take 1 capsule (20 mg total) by mouth 2 (two) times daily before a meal.  . omeprazole (PRILOSEC) 20 MG capsule TAKE 1 CAPSULE (20 MG TOTAL) BY MOUTH 2 (TWO) TIMES DAILY BEFORE A MEAL.  . pramipexole (MIRAPEX) 0.25 MG tablet Take 1.5 tablets (0.375 mg total) by mouth daily.  . pravastatin (PRAVACHOL) 20 MG tablet Take 1 tablet (20 mg total) daily by mouth.   No facility-administered encounter medications on file as of 08/23/2017.     Review of Systems  Constitutional: Negative for appetite change and unexpected weight change.  HENT: Negative for congestion and sinus pressure.   Respiratory: Negative for cough, chest tightness and shortness of breath.   Cardiovascular: Negative for chest pain, palpitations and leg  swelling.  Gastrointestinal: Negative for abdominal pain, diarrhea, nausea and vomiting.  Genitourinary: Negative for difficulty urinating and dysuria.  Musculoskeletal: Negative for joint swelling and myalgias.  Skin: Negative for color change and rash.  Neurological: Negative for dizziness, light-headedness and headaches.  Psychiatric/Behavioral: Negative for agitation and dysphoric mood.       Objective:    Physical Exam  Constitutional: He appears well-developed and well-nourished. No distress.  HENT:  Nose: Nose normal.  Mouth/Throat: Oropharynx is clear and moist.  Neck: Neck supple. No thyromegaly present.  Cardiovascular: Normal rate and regular rhythm.  Pulmonary/Chest: Effort normal and breath sounds normal. No respiratory distress.  Abdominal: Soft. Bowel sounds are normal. There is no tenderness.  Musculoskeletal: He exhibits no edema or tenderness.  Lymphadenopathy:    He has no cervical adenopathy.  Skin: No rash noted. No erythema.  Psychiatric: He has a normal mood and affect. His behavior is normal.    BP 126/88 (BP Location: Left Arm, Patient Position: Sitting, Cuff Size: Normal)   Pulse 61   Temp 98.2 F (36.8 C) (Oral)   Resp 18   Wt 227 lb (103 kg)   SpO2 97%   BMI 33.52 kg/m  Wt Readings from Last 3 Encounters:  08/23/17 227 lb (103 kg)  02/15/17 234 lb 3.2 oz (106.2 kg)  11/19/16 233 lb 6.4 oz (105.9 kg)     Lab Results  Component Value Date   WBC 10.5 11/17/2016   HGB 15.2 11/17/2016   HCT 44.4 11/17/2016   PLT 229 11/17/2016   GLUCOSE 93 03/15/2017   CHOL 172 03/15/2017   TRIG 97.0 03/15/2017   HDL 46.30 03/15/2017   LDLDIRECT 154.7 05/22/2013   LDLCALC 107 (H) 03/15/2017   ALT 16 03/15/2017   AST 47 (H) 03/15/2017   NA 139 03/15/2017   K 4.2 03/15/2017   CL 100 03/15/2017   CREATININE 1.22 03/15/2017   BUN 17 03/15/2017   CO2 32 03/15/2017   TSH 1.97 03/15/2017   INR 1.00 11/17/2016    Mr Brain W Wo Contrast  Result Date:  12/01/2016 CLINICAL DATA:  History of seizure.  Head seizures as a youth. EXAM: MRI HEAD WITHOUT AND WITH CONTRAST TECHNIQUE: Multiplanar, multiecho pulse sequences of the brain and surrounding structures were obtained without and with intravenous contrast. CONTRAST:  44mL MULTIHANCE GADOBENATE DIMEGLUMINE 529 MG/ML IV SOLN COMPARISON:  Head CT 11/17/2016 FINDINGS: Brain: No explanation for seizure. No cortical scarring or mass. Accounting  for remnant cysts, the hippocampi are symmetric and unremarkable. Normal brain volume. Very mild hyperintensity in the cerebral white matter, usually attributed to remote microvascular insults. Lacunar infarcts described on previous head CT are instead dilated perivascular spaces. No specific demyelinating pattern. Vascular: Negative. Skull and upper cervical spine: Negative for marrow lesion Sinuses/Orbits: Focal right maxillary sinusitis with T2 hypointense fluid level that may be proteinaceous. Bilateral cataract resection. IMPRESSION: 1. No explanation for seizure.  Unremarkable brain MRI. 2. Structures described is lacunar infarcts on head CT 11/17/2016 are instead dilated perivascular spaces. 3. Right maxillary sinusitis. Electronically Signed   By: Monte Fantasia M.D.   On: 12/01/2016 15:19       Assessment & Plan:   Problem List Items Addressed This Visit    Abnormal liver function test    Has been stable.  Has adjusted diet.  Lost weight.  Follow.        Relevant Orders   Hepatic function panel   GERD (gastroesophageal reflux disease)    Controlled on current regimen.  Follow.        Osteoporosis    Continue dietary calcium and weight bearing exercises.  Follow vitamin  D level.        Restless leg syndrome    Stable on mirapex.  Seeing neurology.        Sleep apnea    Using cpap regularly.  Doing well.  Follow.        Relevant Orders   CBC with Differential/Platelet   Basic metabolic panel    Other Visit Diagnoses    Screening  cholesterol level    -  Primary   Relevant Orders   Lipid panel       Einar Pheasant, MD

## 2017-08-25 ENCOUNTER — Encounter: Payer: Self-pay | Admitting: Internal Medicine

## 2017-08-25 NOTE — Assessment & Plan Note (Signed)
Has been stable.  Has adjusted diet.  Lost weight.  Follow.

## 2017-08-25 NOTE — Assessment & Plan Note (Signed)
Continue dietary calcium and weight bearing exercises.  Follow vitamin  D level.

## 2017-08-25 NOTE — Assessment & Plan Note (Signed)
Controlled on current regimen.  Follow.  

## 2017-08-25 NOTE — Assessment & Plan Note (Signed)
Using cpap regularly.  Doing well.  Follow.

## 2017-08-25 NOTE — Assessment & Plan Note (Signed)
Stable on mirapex.  Seeing neurology.

## 2017-08-29 ENCOUNTER — Other Ambulatory Visit: Payer: Self-pay | Admitting: Internal Medicine

## 2017-08-30 ENCOUNTER — Encounter: Payer: Self-pay | Admitting: Internal Medicine

## 2017-08-30 ENCOUNTER — Other Ambulatory Visit (INDEPENDENT_AMBULATORY_CARE_PROVIDER_SITE_OTHER): Payer: Medicare Other

## 2017-08-30 DIAGNOSIS — Z13220 Encounter for screening for lipoid disorders: Secondary | ICD-10-CM

## 2017-08-30 DIAGNOSIS — R7989 Other specified abnormal findings of blood chemistry: Secondary | ICD-10-CM

## 2017-08-30 DIAGNOSIS — R945 Abnormal results of liver function studies: Secondary | ICD-10-CM

## 2017-08-30 DIAGNOSIS — G4730 Sleep apnea, unspecified: Secondary | ICD-10-CM | POA: Diagnosis not present

## 2017-08-30 LAB — CBC WITH DIFFERENTIAL/PLATELET
Basophils Absolute: 0.1 10*3/uL (ref 0.0–0.1)
Basophils Relative: 1.1 % (ref 0.0–3.0)
EOS PCT: 4 % (ref 0.0–5.0)
Eosinophils Absolute: 0.2 10*3/uL (ref 0.0–0.7)
HCT: 44.4 % (ref 39.0–52.0)
Hemoglobin: 15.5 g/dL (ref 13.0–17.0)
LYMPHS ABS: 1.2 10*3/uL (ref 0.7–4.0)
Lymphocytes Relative: 23.2 % (ref 12.0–46.0)
MCHC: 35 g/dL (ref 30.0–36.0)
MCV: 94.4 fl (ref 78.0–100.0)
MONO ABS: 0.5 10*3/uL (ref 0.1–1.0)
Monocytes Relative: 9.9 % (ref 3.0–12.0)
NEUTROS ABS: 3.1 10*3/uL (ref 1.4–7.7)
NEUTROS PCT: 61.8 % (ref 43.0–77.0)
PLATELETS: 225 10*3/uL (ref 150.0–400.0)
RBC: 4.7 Mil/uL (ref 4.22–5.81)
RDW: 13.3 % (ref 11.5–15.5)
WBC: 5.1 10*3/uL (ref 4.0–10.5)

## 2017-08-30 LAB — HEPATIC FUNCTION PANEL
ALT: 17 U/L (ref 0–53)
AST: 43 U/L — AB (ref 0–37)
Albumin: 3.9 g/dL (ref 3.5–5.2)
Alkaline Phosphatase: 56 U/L (ref 39–117)
BILIRUBIN DIRECT: 0.1 mg/dL (ref 0.0–0.3)
TOTAL PROTEIN: 6.9 g/dL (ref 6.0–8.3)
Total Bilirubin: 0.6 mg/dL (ref 0.2–1.2)

## 2017-08-30 LAB — BASIC METABOLIC PANEL
BUN: 17 mg/dL (ref 6–23)
CALCIUM: 9.2 mg/dL (ref 8.4–10.5)
CO2: 32 mEq/L (ref 19–32)
CREATININE: 1.12 mg/dL (ref 0.40–1.50)
Chloride: 103 mEq/L (ref 96–112)
GFR: 68.77 mL/min (ref >60.00)
Glucose, Bld: 92 mg/dL (ref 70–99)
Potassium: 4.2 mEq/L (ref 3.5–5.1)
Sodium: 140 mEq/L (ref 135–145)

## 2017-08-30 LAB — LIPID PANEL
CHOLESTEROL: 156 mg/dL (ref 0–200)
HDL: 47.6 mg/dL (ref >39.00)
LDL Cholesterol: 95 mg/dL (ref 0–99)
NonHDL: 108.68
Total CHOL/HDL Ratio: 3
Triglycerides: 66 mg/dL (ref 0.0–149.0)
VLDL: 13.2 mg/dL (ref 0.0–40.0)

## 2017-09-04 ENCOUNTER — Telehealth: Payer: Self-pay

## 2017-09-04 NOTE — Telephone Encounter (Signed)
Copied from [place] [id number]. Topic: Appointment Scheduling - Scheduling Inquiry for Clinic >> Sep 04, 2017 10:43 AM Neva Seat wrote: Needing to reschedule May 20 AWV.  Please call pt to reschedule appt.

## 2017-09-04 NOTE — Telephone Encounter (Signed)
Lm for pt call back and sched ok for [facility] to schedule

## 2017-09-11 ENCOUNTER — Telehealth: Payer: Self-pay

## 2017-09-11 NOTE — Telephone Encounter (Signed)
Copied from [place] [id number]. Topic: Quick Communication - Appointment Cancellation >> Sep 11, 2017 10:52 AM Neva Seat wrote: Pt had to cancel his AWV on 09-16-17.  Please call pt back to reschedule.

## 2017-09-11 NOTE — Telephone Encounter (Signed)
Please schedule AWV for pt.

## 2017-09-12 NOTE — Telephone Encounter (Signed)
Rescheduled for 10/07/17

## 2017-09-16 ENCOUNTER — Ambulatory Visit: Payer: Federal, State, Local not specified - PPO

## 2017-10-07 ENCOUNTER — Ambulatory Visit (INDEPENDENT_AMBULATORY_CARE_PROVIDER_SITE_OTHER): Payer: Medicare Other

## 2017-10-07 VITALS — BP 120/78 | HR 58 | Temp 98.3°F | Resp 14 | Ht 69.0 in | Wt 226.4 lb

## 2017-10-07 DIAGNOSIS — Z0000 Encounter for general adult medical examination without abnormal findings: Secondary | ICD-10-CM | POA: Diagnosis not present

## 2017-10-07 NOTE — Patient Instructions (Addendum)
  Joseph Good , Thank you for taking time to come for your Medicare Wellness Visit. I appreciate your ongoing commitment to your health goals. Please review the following plan we discussed and let me know if I can assist you in the future.   Follow up as needed.    Bring a copy of your [place] and/or Living Will to be scanned into chart once completed.   Have a great day!  These are the goals we discussed: Goals    . Increase physical activity     Walk for exercise        This is a list of the screening recommended for you and due dates:  Health Maintenance  Topic Date Due  . Flu Shot  11/28/2017  . Colon Cancer Screening  12/29/2020  . Tetanus Vaccine  12/29/2025  .  Hepatitis C: One time screening is recommended by Center for Disease Control  (CDC) for  adults born from [birth year] through 1965.   Completed  . Pneumonia vaccines  Completed

## 2017-10-07 NOTE — Progress Notes (Addendum)
Subjective:   Joseph Good. is a 72 y.o. male who presents for Medicare Annual/Subsequent preventive examination.  Review of Systems:  No ROS.  Medicare Wellness Visit. Additional risk factors are reflected in the social history.  Cardiac Risk Factors include: advanced age (>68men, >22 women);male gender     Objective:    Vitals: BP 120/78 (BP Location: Left Arm, Patient Position: Sitting, Cuff Size: Normal)   Pulse (!) 58   Temp 98.3 F (36.8 C) (Oral)   Resp 14   Ht 5\' 9"  (1.753 m)   Wt 226 lb 6.4 oz (102.7 kg)   SpO2 96%   BMI 33.43 kg/m   Body mass index is 33.43 kg/m.  Advanced Directives 10/07/2017 11/17/2016 09/13/2016 09/14/2015 07/31/2015  Does Patient Have a Medical Advance Directive? No No No No No  Would patient like information on creating a medical advance directive? Yes (MAU/Ambulatory/Procedural Areas - Information given) No - Patient declined No - Patient declined No - patient declined information No - patient declined information    Tobacco Social History   Tobacco Use  Smoking Status Never Smoker  Smokeless Tobacco Never Used     Counseling given: Not Answered   Clinical Intake:  Pre-visit preparation completed: Yes  Pain : No/denies pain     Nutritional Status: BMI > 30  Obese Diabetes: No  How often do you need to have someone help you when you read instructions, pamphlets, or other written materials from your doctor or pharmacy?: 1 - Never  Interpreter Needed?: No     Past Medical History:  Diagnosis Date  . Asthma   . Bladder outlet obstruction   . Crohn's disease ([place])    appendix, s/p appendectomy  . GERD (gastroesophageal reflux disease)    schatzki ring  . Hypercholesterolemia   . Restless leg syndrome   . Seizure disorder [facility])    age 54, previously on phenobarbital  . Skin cancer, basal cell   . Sleep apnea    Past Surgical History:  Procedure Laterality Date  . APPENDECTOMY  1992   lysis of adhesions (bowel  blockage)  . inguinal hernia surgery  1999   left  . KNEE ARTHROSCOPY  1982  . [place]  . TONSILLECTOMY AND ADENOIDECTOMY  1956   Family History  Problem Relation Age of Onset  . Heart disease Father   . Congestive Heart Failure Mother   . Colon cancer Neg Hx   . Prostate cancer Neg Hx    Social History   Socioeconomic History  . Marital status: Married    Spouse name: Not on file  . Number of children: 0  . Years of education: Not on file  . Highest education level: Not on file  Occupational History  . Not on file  Social Needs  . Financial resource strain: Not hard at all  . Food insecurity:    Worry: Never true    Inability: Never true  . Transportation needs:    Medical: No    Non-medical: No  Tobacco Use  . Smoking status: Never Smoker  . Smokeless tobacco: Never Used  Substance and Sexual Activity  . Alcohol use: No    Alcohol/week: 0.0 oz  . Drug use: No  . Sexual activity: Not Currently  Lifestyle  . Physical activity:    Days per week: Not on file    Minutes per session: Not on file  . Stress: Not on file  Relationships  .  Social connections:    Talks on phone: Not on file    Gets together: Not on file    Attends religious service: Not on file    Active member of club or organization: Not on file    Attends meetings of clubs or organizations: Not on file    Relationship status: Not on file  Other Topics Concern  . Not on file  Social History Narrative  . Not on file    Outpatient Encounter Medications as of 10/07/2017  Medication Sig  . albuterol (PROVENTIL HFA;VENTOLIN HFA) 108 (90 BASE) MCG/ACT inhaler Inhale 2 puffs into the lungs every 6 (six) hours as needed for wheezing or shortness of breath.  Marland Kitchen CALCIUM CITRATE PO Take 1 tablet by mouth daily.  . Cholecalciferol (VITAMIN D-3) 1000 UNITS CAPS Take 1 capsule by mouth daily.  . finasteride (PROSCAR) 5 MG tablet Take 5 mg by mouth daily.  . Fluticasone-Salmeterol (ADVAIR  DISKUS) 100-50 MCG/DOSE AEPB INHALE 1 PUFF EVERY DAY  . Magnesium 250 MG TABS Two tablets q day  . omeprazole (PRILOSEC) 20 MG capsule Take 1 capsule (20 mg total) by mouth 2 (two) times daily before a meal.  . omeprazole (PRILOSEC) 20 MG capsule TAKE 1 CAPSULE (20 MG TOTAL) BY MOUTH 2 (TWO) TIMES DAILY BEFORE A MEAL.  . pramipexole (MIRAPEX) 0.25 MG tablet Take 1.5 tablets (0.375 mg total) by mouth daily.  . pravastatin (PRAVACHOL) 20 MG tablet TAKE 1 TABLET DAILY   No facility-administered encounter medications on file as of 10/07/2017.     Activities of Daily Living In your present state of health, do you have any difficulty performing the following activities: 10/07/2017  Hearing? Y  Comment Hearing aid, bilateral  Vision? N  Difficulty concentrating or making decisions? N  Walking or climbing stairs? N  Dressing or bathing? N  Doing errands, shopping? N  Preparing Food and eating ? N  Using the Toilet? N  In the past six months, have you accidently leaked urine? N  Do you have problems with loss of bowel control? N  Managing your Medications? N  Managing your Finances? N  Housekeeping or managing your Housekeeping? N  Some recent data might be hidden    Patient Care Team: Einar Pheasant, MD as PCP - General (Internal Medicine)   Assessment:   This is a routine wellness examination for Joseph Good.  The goal of the wellness visit is to assist the patient how to close the gaps in care and create a preventative care plan for the patient.   The roster of all physicians providing medical care to patient is listed in the Snapshot section of the chart.  Taking calcium VIT D as appropriate/Osteoporosis reviewed.    Safety issues reviewed; Smoke and carbon monoxide detectors in the home. No firearms in the home. Wears seatbelts when driving or riding with others. No violence in the home.  They do not have excessive sun exposure.  Discussed the need for sun protection: hats, long  sleeves and the use of sunscreen if there is significant sun exposure.  Patient is alert, normal appearance, oriented to person/place/and time.  Correctly identified the president of the [place] and recalls of 3/3 words. Performs simple calculations and can read correct time from watch face. Displays appropriate judgement.  No new identified risk were noted.  No failures at ADL's or IADL's.    BMI- discussed the importance of a healthy diet, water intake and the benefits of aerobic exercise. Educational material provided.  24 hour diet recall: Low carb diet, low cholesterol diet  Dental- UTD.  Eye- Visual acuity not assessed per patient preference.  He plans to schedule with [facility]. Wears corrective lenses.  Sleep patterns- Sleeps 7-8 hours at night.  Wakes feeling rested. CPAP in use.  Health maintenance gaps- closed.  Patient Concerns: None at this time. Follow up with PCP as needed.  Exercise Activities and Dietary recommendations Current Exercise Habits: The patient does not participate in regular exercise at present  Goals    . Increase physical activity     Walk for exercise        Fall Risk Fall Risk  10/07/2017 09/13/2016 02/03/2016 09/14/2015 07/28/2015  Falls in the past year? No No No No No   Depression Screen PHQ 2/9 Scores 10/07/2017 09/13/2016 02/03/2016 09/14/2015  PHQ - 2 Score 0 0 0 0  PHQ- 9 Score - 0 - -    Cognitive Function MMSE - Mini Mental State Exam 10/07/2017 09/13/2016 09/14/2015  Orientation to time 5 5 5   Orientation to Place 5 5 5   Registration 3 3 3   Attention/ Calculation 5 5 5   Recall 3 3 3   Language- name 2 objects 2 2 2   Language- repeat 1 1 1   Language- follow 3 step command 3 3 3   Language- read & follow direction 1 1 1   Write a sentence 1 1 1   Copy design 1 1 1   Total score 30 30 30         Immunization History  Administered Date(s) Administered  . Influenza Split 03/02/2013  . Influenza, High Dose Seasonal PF 02/03/2016,  02/15/2017  . Influenza,inj,Quad PF,6+ Mos 01/22/2014, 01/31/2015  . Pneumococcal Conjugate-13 07/22/2013  . Pneumococcal Polysaccharide-23 02/11/2015  . Tdap 12/30/2015   Screening Tests Health Maintenance  Topic Date Due  . INFLUENZA VACCINE  11/28/2017  . COLONOSCOPY  12/29/2020  . TETANUS/TDAP  12/29/2025  . Hepatitis C Screening  Completed  . PNA vac Low Risk Adult  Completed       Plan:    End of life planning; Advance aging; Advanced directives discussed. Copy of current HCPOA/Living Will requested upon completion.    I have personally reviewed and noted the following in the patient's chart:   . Medical and social history . Use of alcohol, tobacco or illicit drugs  . Current medications and supplements . Functional ability and status . Nutritional status . Physical activity . Advanced directives . List of other physicians . Hospitalizations, surgeries, and ER visits in previous 12 months . Vitals . Screenings to include cognitive, depression, and falls . Referrals and appointments  In addition, I have reviewed and discussed with patient certain preventive protocols, quality metrics, and best practice recommendations. A written personalized care plan for preventive services as well as general preventive health recommendations were provided to patient.     OBrien-Blaney, Shakeria Robinette L, LPN  07/27/5186   I have reviewed the above information and agree with above.   Deborra Medina, MD

## 2017-10-15 ENCOUNTER — Encounter: Payer: Self-pay | Admitting: Internal Medicine

## 2017-10-16 DIAGNOSIS — L570 Actinic keratosis: Secondary | ICD-10-CM | POA: Diagnosis not present

## 2017-10-16 DIAGNOSIS — Z859 Personal history of malignant neoplasm, unspecified: Secondary | ICD-10-CM | POA: Diagnosis not present

## 2017-10-16 DIAGNOSIS — Z1283 Encounter for screening for malignant neoplasm of skin: Secondary | ICD-10-CM | POA: Diagnosis not present

## 2017-10-16 DIAGNOSIS — L821 Other seborrheic keratosis: Secondary | ICD-10-CM | POA: Diagnosis not present

## 2017-10-16 DIAGNOSIS — L578 Other skin changes due to chronic exposure to nonionizing radiation: Secondary | ICD-10-CM | POA: Diagnosis not present

## 2017-10-17 NOTE — Telephone Encounter (Signed)
See his my chart message.  They ordered a stair lift and need a note stating medically necessary.  I assume this is related to the injury she had when she fell.   Given limitations from the fall, etc, would need to get the order from ortho.  They have been following her for hamstring tear.  Also, see message my chart message on his wife.

## 2017-10-17 NOTE — Telephone Encounter (Signed)
Advised patient to call ortho

## 2017-11-06 ENCOUNTER — Other Ambulatory Visit: Payer: Self-pay | Admitting: Internal Medicine

## 2017-11-28 ENCOUNTER — Other Ambulatory Visit: Payer: Self-pay

## 2017-12-04 ENCOUNTER — Other Ambulatory Visit: Payer: Self-pay | Admitting: Internal Medicine

## 2017-12-04 ENCOUNTER — Telehealth: Payer: Self-pay

## 2017-12-04 NOTE — Telephone Encounter (Signed)
Called patient needing to know if he is requesting CPAP supplies from [facility] and also need to know what supplies he needs and what his pressure setting is. LMTCB,

## 2017-12-04 NOTE — Telephone Encounter (Signed)
Pt calling and states that he has received the supplies for his CPAP.

## 2017-12-05 NOTE — Telephone Encounter (Signed)
noted 

## 2018-01-04 ENCOUNTER — Other Ambulatory Visit: Payer: Self-pay | Admitting: Internal Medicine

## 2018-02-11 ENCOUNTER — Other Ambulatory Visit: Payer: Self-pay | Admitting: Internal Medicine

## 2018-02-25 ENCOUNTER — Encounter: Payer: Self-pay | Admitting: Internal Medicine

## 2018-02-26 ENCOUNTER — Telehealth: Payer: Self-pay | Admitting: Internal Medicine

## 2018-02-26 NOTE — Telephone Encounter (Signed)
Spoke with patient. He says he does not need supplies at this time but also asked while I was on the phone if Dr. Nicki Reaper would be okay with filling his finasteride. Previously saw Dr. Jacqlyn Larsen but he is no longer practicing in state. Pt has enough pills for about 3 weeks left

## 2018-02-26 NOTE — Telephone Encounter (Signed)
Copied from [place] [id number]. Topic: General - Other >> Feb 26, 2018  2:09 PM Keene Breath wrote: Reason for CRM: Legrand Como with [facility] called to verify that the office received a request for new C-Pap supplies for patient.  Called the office to verify and was put on hold for a while.  Legrand Como with [facility] eventually hung up the phone.  Please advise and call back to verify that supply request was received.  [phone]

## 2018-02-27 ENCOUNTER — Other Ambulatory Visit: Payer: Self-pay

## 2018-02-27 MED ORDER — FINASTERIDE 5 MG PO TABS: 5.0000 mg | ORAL_TABLET | Freq: Every day | ORAL | 0 refills | Status: DC

## 2018-02-27 MED ORDER — FINASTERIDE 5 MG PO TABS: 5.0000 mg | ORAL_TABLET | Freq: Every day | ORAL | 2 refills | Status: DC

## 2018-02-27 NOTE — Telephone Encounter (Signed)
rx sent in to [address]. Not sure if this is the correct pharmacy.

## 2018-02-27 NOTE — Telephone Encounter (Signed)
rx sent to [facility]. rx at [facility] cancelled. Pt aware

## 2018-02-28 ENCOUNTER — Encounter: Payer: Federal, State, Local not specified - PPO | Admitting: Internal Medicine

## 2018-03-07 ENCOUNTER — Other Ambulatory Visit: Payer: Self-pay | Admitting: Internal Medicine

## 2018-03-11 ENCOUNTER — Encounter: Payer: Self-pay | Admitting: Internal Medicine

## 2018-03-11 ENCOUNTER — Ambulatory Visit (INDEPENDENT_AMBULATORY_CARE_PROVIDER_SITE_OTHER): Payer: Medicare Other | Admitting: Internal Medicine

## 2018-03-11 ENCOUNTER — Ambulatory Visit (INDEPENDENT_AMBULATORY_CARE_PROVIDER_SITE_OTHER): Payer: Medicare Other

## 2018-03-11 VITALS — BP 128/70 | HR 70 | Temp 97.7°F | Resp 18 | Ht 69.0 in | Wt 232.6 lb

## 2018-03-11 DIAGNOSIS — Z0000 Encounter for general adult medical examination without abnormal findings: Secondary | ICD-10-CM | POA: Diagnosis not present

## 2018-03-11 DIAGNOSIS — Z13220 Encounter for screening for lipoid disorders: Secondary | ICD-10-CM | POA: Diagnosis not present

## 2018-03-11 DIAGNOSIS — R0602 Shortness of breath: Secondary | ICD-10-CM

## 2018-03-11 DIAGNOSIS — G4730 Sleep apnea, unspecified: Secondary | ICD-10-CM | POA: Diagnosis not present

## 2018-03-11 DIAGNOSIS — R945 Abnormal results of liver function studies: Secondary | ICD-10-CM

## 2018-03-11 DIAGNOSIS — Z125 Encounter for screening for malignant neoplasm of prostate: Secondary | ICD-10-CM

## 2018-03-11 DIAGNOSIS — K219 Gastro-esophageal reflux disease without esophagitis: Secondary | ICD-10-CM

## 2018-03-11 DIAGNOSIS — J4520 Mild intermittent asthma, uncomplicated: Secondary | ICD-10-CM

## 2018-03-11 DIAGNOSIS — R7989 Other specified abnormal findings of blood chemistry: Secondary | ICD-10-CM

## 2018-03-11 MED ORDER — ALBUTEROL SULFATE HFA 108 (90 BASE) MCG/ACT IN AERS: 2.0000 | INHALATION_SPRAY | Freq: Four times a day (QID) | RESPIRATORY_TRACT | 2 refills | Status: DC | PRN

## 2018-03-11 NOTE — Progress Notes (Signed)
Patient ID: Joseph Glimpse., male   DOB: February 01, 1946, 72 y.o.   MRN: [id number]   Subjective:    Patient ID: Joseph Glimpse., male    DOB: [birth year], 72 y.o.   MRN: [id number]  HPI  Patient with past history of hypercholesterolemia, sleep apnea and restless legs.  He comes in today to f/u on these issues as well as for a complete physical exam.  He is accompanied by his wife.  History obtained from both of them.  States he has been doing relatively well.  Does report occasional right knee pain.  Occurs when he has to stand a lot.  Recently flared when he was working at [employer] sites.  Better now.  Desires no further intervention at this time.  Swallowing ok.  No acid reflux.  Does report some sob with exertion.  Noticed a change.  No chest pain.  No abdominal pain.  Bowels moving.     Past Medical History:  Diagnosis Date  . Asthma   . Bladder outlet obstruction   . Crohn's disease ([place])    appendix, s/p appendectomy  . GERD (gastroesophageal reflux disease)    schatzki ring  . Hypercholesterolemia   . Restless leg syndrome   . Seizure disorder [facility])    age 84, previously on phenobarbital  . Skin cancer, basal cell   . Sleep apnea    Past Surgical History:  Procedure Laterality Date  . APPENDECTOMY  1992   lysis of adhesions (bowel blockage)  . inguinal hernia surgery  1999   left  . KNEE ARTHROSCOPY  1982  . [place]  . TONSILLECTOMY AND ADENOIDECTOMY  1956   Family History  Problem Relation Age of Onset  . Heart disease Father   . Congestive Heart Failure Mother   . Colon cancer Neg Hx   . Prostate cancer Neg Hx    Social History   Socioeconomic History  . Marital status: Married    Spouse name: Not on file  . Number of children: 0  . Years of education: Not on file  . Highest education level: Not on file  Occupational History  . Not on file  Social Needs  . Financial resource strain: Not hard at all  . Food insecurity:   Worry: Never true    Inability: Never true  . Transportation needs:    Medical: No    Non-medical: No  Tobacco Use  . Smoking status: Never Smoker  . Smokeless tobacco: Never Used  Substance and Sexual Activity  . Alcohol use: No    Alcohol/week: 0.0 standard drinks  . Drug use: No  . Sexual activity: Not Currently  Lifestyle  . Physical activity:    Days per week: Not on file    Minutes per session: Not on file  . Stress: Not on file  Relationships  . Social connections:    Talks on phone: Not on file    Gets together: Not on file    Attends religious service: Not on file    Active member of club or organization: Not on file    Attends meetings of clubs or organizations: Not on file    Relationship status: Not on file  Other Topics Concern  . Not on file  Social History Narrative  . Not on file    Outpatient Encounter Medications as of 03/11/2018  Medication Sig  . albuterol (PROVENTIL HFA;VENTOLIN HFA) 108 (90 Base) MCG/ACT inhaler Inhale 2 puffs  into the lungs every 6 (six) hours as needed for wheezing or shortness of breath.  Marland Kitchen CALCIUM CITRATE PO Take 1 tablet by mouth daily.  . Cholecalciferol (VITAMIN D-3) 1000 UNITS CAPS Take 1 capsule by mouth daily.  . finasteride (PROSCAR) 5 MG tablet Take 1 tablet (5 mg total) by mouth daily.  . Fluticasone-Salmeterol (ADVAIR DISKUS) 100-50 MCG/DOSE AEPB INHALE 1 PUFF EVERY DAY  . Magnesium 250 MG TABS Two tablets q day  . omeprazole (PRILOSEC) 20 MG capsule TAKE 1 CAPSULE (20 MG TOTAL) BY MOUTH 2 (TWO) TIMES DAILY BEFORE A MEAL.  . pravastatin (PRAVACHOL) 20 MG tablet TAKE 1 TABLET DAILY  . [DISCONTINUED] albuterol (PROVENTIL HFA;VENTOLIN HFA) 108 (90 BASE) MCG/ACT inhaler Inhale 2 puffs into the lungs every 6 (six) hours as needed for wheezing or shortness of breath.  . [DISCONTINUED] pramipexole (MIRAPEX) 0.25 MG tablet Take 1.5 tablets (0.375 mg total) by mouth daily.   No facility-administered encounter medications on file  as of 03/11/2018.     Review of Systems  Constitutional: Negative for appetite change and unexpected weight change.  HENT: Negative for congestion and sinus pressure.   Eyes: Negative for pain and visual disturbance.  Respiratory: Negative for cough and chest tightness.        SOB with exertion.    Cardiovascular: Negative for chest pain, palpitations and leg swelling.  Gastrointestinal: Negative for abdominal pain, diarrhea, nausea and vomiting.  Genitourinary: Negative for difficulty urinating and dysuria.  Musculoskeletal: Negative for joint swelling and myalgias.  Skin: Negative for color change and rash.  Neurological: Negative for dizziness, light-headedness and headaches.  Hematological: Negative for adenopathy. Does not bruise/bleed easily.  Psychiatric/Behavioral: Negative for agitation and dysphoric mood.       Objective:    Physical Exam  Constitutional: He is oriented to person, place, and time. He appears well-developed and well-nourished. No distress.  HENT:  Head: Normocephalic and atraumatic.  Nose: Nose normal.  Mouth/Throat: Oropharynx is clear and moist. No oropharyngeal exudate.  Eyes: Conjunctivae are normal. Right eye exhibits no discharge. Left eye exhibits no discharge.  Neck: Neck supple. No thyromegaly present.  Cardiovascular: Normal rate and regular rhythm.  Pulmonary/Chest: Effort normal and breath sounds normal. No respiratory distress. He has no wheezes.  Abdominal: Soft. Bowel sounds are normal. There is no tenderness.  Genitourinary:  Genitourinary Comments: Not performed.    Musculoskeletal: He exhibits no edema or tenderness.  Lymphadenopathy:    He has no cervical adenopathy.  Neurological: He is alert and oriented to person, place, and time.  Skin: No rash noted. No erythema.  Psychiatric: He has a normal mood and affect. His behavior is normal.    BP 128/70 (BP Location: Left Arm, Patient Position: Sitting, Cuff Size: Normal)   Pulse  70   Temp 97.7 F (36.5 C) (Oral)   Resp 18   Ht 5\' 9"  (1.753 m)   Wt 232 lb 9.6 oz (105.5 kg)   SpO2 97%   BMI 34.35 kg/m  Wt Readings from Last 3 Encounters:  03/11/18 232 lb 9.6 oz (105.5 kg)  10/07/17 226 lb 6.4 oz (102.7 kg)  08/23/17 227 lb (103 kg)     Lab Results  Component Value Date   WBC 5.1 08/30/2017   HGB 15.5 08/30/2017   HCT 44.4 08/30/2017   PLT 225.0 08/30/2017   GLUCOSE 92 08/30/2017   CHOL 156 08/30/2017   TRIG 66.0 08/30/2017   HDL 47.60 08/30/2017   LDLDIRECT 154.7 05/22/2013  [place] 95 08/30/2017   ALT 17 08/30/2017   AST 43 (H) 08/30/2017   NA 140 08/30/2017   K 4.2 08/30/2017   CL 103 08/30/2017   CREATININE 1.12 08/30/2017   BUN 17 08/30/2017   CO2 32 08/30/2017   TSH 1.97 03/15/2017   INR 1.00 11/17/2016    Mr Brain W LJ Contrast  Result Date: 12/01/2016 CLINICAL DATA:  History of seizure.  Head seizures as a youth. EXAM: MRI HEAD WITHOUT AND WITH CONTRAST TECHNIQUE: Multiplanar, multiecho pulse sequences of the brain and surrounding structures were obtained without and with intravenous contrast. CONTRAST:  56mL MULTIHANCE GADOBENATE DIMEGLUMINE 529 MG/ML IV SOLN COMPARISON:  Head CT 11/17/2016 FINDINGS: Brain: No explanation for seizure. No cortical scarring or mass. Accounting for remnant cysts, the hippocampi are symmetric and unremarkable. Normal brain volume. Very mild hyperintensity in the cerebral white matter, usually attributed to remote microvascular insults. Lacunar infarcts described on previous head CT are instead dilated perivascular spaces. No specific demyelinating pattern. Vascular: Negative. Skull and upper cervical spine: Negative for marrow lesion Sinuses/Orbits: Focal right maxillary sinusitis with T2 hypointense fluid level that may be proteinaceous. Bilateral cataract resection. IMPRESSION: 1. No explanation for seizure.  Unremarkable brain MRI. 2. Structures described is lacunar infarcts on head CT 11/17/2016 are instead  dilated perivascular spaces. 3. Right maxillary sinusitis. Electronically Signed   By: Monte Fantasia M.D.   On: 12/01/2016 15:19       Assessment & Plan:   Problem List Items Addressed This Visit    Abnormal liver function test    Has been stable.  Weight up some from last check.  Discussed diet and exercise.  Follow liver panel.        Relevant Orders   Hepatic function panel   Asthma    Breathing stable.  Refilled inhaler.  Follow.        Relevant Medications   albuterol (PROVENTIL HFA;VENTOLIN HFA) 108 (90 Base) MCG/ACT inhaler   GERD (gastroesophageal reflux disease)    Controlled on current regimen.  Follow.        Healthcare maintenance    Physical 03/11/18.  Colonoscopy 2013.  Follow psa.  Has seen Dr Jacqlyn Larsen.  Last evaluated 02/14/17.        Sleep apnea    Using cpap regularly.  Follow.        Relevant Orders   CBC with Differential/Platelet   TSH   Basic metabolic panel   SOB (shortness of breath) - Primary    SOB with exertion.  EKG - SR with no acute ischemic changes.  Discussed evaluation with cardiology to confirm no further w/up warranted.  He agrees. Referral to cardiology.  Also check cxr.        Relevant Orders   EKG 12-Lead (Completed)   DG Chest 2 View (Completed)    Other Visit Diagnoses    Prostate cancer screening       Relevant Orders   PSA, Medicare   Screening cholesterol level       Relevant Orders   Lipid panel       Einar Pheasant, MD

## 2018-03-11 NOTE — Assessment & Plan Note (Addendum)
Physical 03/11/18.  Colonoscopy 2013.  Follow psa.  Has seen Dr Jacqlyn Larsen.  Last evaluated 02/14/17.

## 2018-03-13 ENCOUNTER — Encounter: Payer: Self-pay | Admitting: Internal Medicine

## 2018-03-13 ENCOUNTER — Other Ambulatory Visit: Payer: Self-pay | Admitting: Internal Medicine

## 2018-03-13 DIAGNOSIS — R0602 Shortness of breath: Secondary | ICD-10-CM

## 2018-03-13 NOTE — Progress Notes (Signed)
Order placed for cardiology referral.   

## 2018-03-13 NOTE — Telephone Encounter (Signed)
Order placed for pulmonary referral.  

## 2018-03-14 ENCOUNTER — Other Ambulatory Visit: Payer: Self-pay | Admitting: Internal Medicine

## 2018-03-16 ENCOUNTER — Encounter: Payer: Self-pay | Admitting: Internal Medicine

## 2018-03-16 DIAGNOSIS — R0602 Shortness of breath: Secondary | ICD-10-CM | POA: Insufficient documentation

## 2018-03-16 DIAGNOSIS — R0609 Other forms of dyspnea: Secondary | ICD-10-CM | POA: Insufficient documentation

## 2018-03-16 NOTE — Assessment & Plan Note (Addendum)
SOB with exertion.  EKG - SR with no acute ischemic changes.  Discussed evaluation with cardiology to confirm no further w/up warranted.  He agrees. Referral to cardiology.  Also check cxr.

## 2018-03-16 NOTE — Assessment & Plan Note (Signed)
Has been stable.  Weight up some from last check.  Discussed diet and exercise.  Follow liver panel.

## 2018-03-16 NOTE — Assessment & Plan Note (Signed)
Controlled on current regimen.  Follow.  

## 2018-03-16 NOTE — Assessment & Plan Note (Signed)
Using cpap regularly.  Follow.  

## 2018-03-16 NOTE — Assessment & Plan Note (Signed)
Breathing stable.  Refilled inhaler.  Follow.

## 2018-03-20 ENCOUNTER — Other Ambulatory Visit (INDEPENDENT_AMBULATORY_CARE_PROVIDER_SITE_OTHER): Payer: Medicare Other

## 2018-03-20 DIAGNOSIS — Z125 Encounter for screening for malignant neoplasm of prostate: Secondary | ICD-10-CM

## 2018-03-20 DIAGNOSIS — R9431 Abnormal electrocardiogram [ECG] [EKG]: Secondary | ICD-10-CM | POA: Insufficient documentation

## 2018-03-20 DIAGNOSIS — Z13220 Encounter for screening for lipoid disorders: Secondary | ICD-10-CM | POA: Diagnosis not present

## 2018-03-20 DIAGNOSIS — Z8639 Personal history of other endocrine, nutritional and metabolic disease: Secondary | ICD-10-CM | POA: Diagnosis not present

## 2018-03-20 DIAGNOSIS — Z8669 Personal history of other diseases of the nervous system and sense organs: Secondary | ICD-10-CM | POA: Diagnosis not present

## 2018-03-20 DIAGNOSIS — R7989 Other specified abnormal findings of blood chemistry: Secondary | ICD-10-CM

## 2018-03-20 DIAGNOSIS — R945 Abnormal results of liver function studies: Secondary | ICD-10-CM | POA: Diagnosis not present

## 2018-03-20 DIAGNOSIS — R0602 Shortness of breath: Secondary | ICD-10-CM | POA: Diagnosis not present

## 2018-03-20 DIAGNOSIS — G4730 Sleep apnea, unspecified: Secondary | ICD-10-CM | POA: Diagnosis not present

## 2018-03-20 LAB — CBC WITH DIFFERENTIAL/PLATELET
Basophils Absolute: 0 10*3/uL (ref 0.0–0.1)
Basophils Relative: 0.8 % (ref 0.0–3.0)
EOS PCT: 3 % (ref 0.0–5.0)
Eosinophils Absolute: 0.2 10*3/uL (ref 0.0–0.7)
HCT: 46.1 % (ref 39.0–52.0)
Hemoglobin: 15.8 g/dL (ref 13.0–17.0)
LYMPHS ABS: 1.2 10*3/uL (ref 0.7–4.0)
Lymphocytes Relative: 22.5 % (ref 12.0–46.0)
MCHC: 34.3 g/dL (ref 30.0–36.0)
MCV: 95 fl (ref 78.0–100.0)
MONO ABS: 0.6 10*3/uL (ref 0.1–1.0)
MONOS PCT: 10.2 % (ref 3.0–12.0)
NEUTROS ABS: 3.4 10*3/uL (ref 1.4–7.7)
NEUTROS PCT: 63.5 % (ref 43.0–77.0)
PLATELETS: 239 10*3/uL (ref 150.0–400.0)
RBC: 4.85 Mil/uL (ref 4.22–5.81)
RDW: 12.8 % (ref 11.5–15.5)
WBC: 5.4 10*3/uL (ref 4.0–10.5)

## 2018-03-20 LAB — HEPATIC FUNCTION PANEL
ALK PHOS: 62 U/L (ref 39–117)
ALT: 17 U/L (ref 0–53)
AST: 45 U/L — AB (ref 0–37)
Albumin: 4.1 g/dL (ref 3.5–5.2)
BILIRUBIN DIRECT: 0.1 mg/dL (ref 0.0–0.3)
BILIRUBIN TOTAL: 0.8 mg/dL (ref 0.2–1.2)
Total Protein: 6.8 g/dL (ref 6.0–8.3)

## 2018-03-20 LAB — TSH: TSH: 1.9 u[IU]/mL (ref 0.35–4.50)

## 2018-03-20 LAB — BASIC METABOLIC PANEL
BUN: 16 mg/dL (ref 6–23)
CALCIUM: 9.2 mg/dL (ref 8.4–10.5)
CO2: 32 mEq/L (ref 19–32)
CREATININE: 1.25 mg/dL (ref 0.40–1.50)
Chloride: 101 mEq/L (ref 96–112)
GFR: 60.49 mL/min (ref >60.00)
Glucose, Bld: 90 mg/dL (ref 70–99)
POTASSIUM: 4.2 meq/L (ref 3.5–5.1)
Sodium: 139 mEq/L (ref 135–145)

## 2018-03-20 LAB — LIPID PANEL
Cholesterol: 166 mg/dL (ref 0–200)
HDL: 39.7 mg/dL (ref >39.00)
LDL Cholesterol: 98 mg/dL (ref 0–99)
NonHDL: 126.62
Total CHOL/HDL Ratio: 4
Triglycerides: 143 mg/dL (ref 0.0–149.0)
VLDL: 28.6 mg/dL (ref 0.0–40.0)

## 2018-03-20 LAB — PSA, MEDICARE: PSA: 1.12 ng/ml (ref 0.10–4.00)

## 2018-03-21 ENCOUNTER — Encounter: Payer: Self-pay | Admitting: Pulmonary Disease

## 2018-03-21 ENCOUNTER — Ambulatory Visit (INDEPENDENT_AMBULATORY_CARE_PROVIDER_SITE_OTHER): Payer: Medicare Other | Admitting: Pulmonary Disease

## 2018-03-21 VITALS — BP 132/82 | HR 70 | Resp 16 | Ht 69.0 in | Wt 230.0 lb

## 2018-03-21 DIAGNOSIS — J45909 Unspecified asthma, uncomplicated: Secondary | ICD-10-CM

## 2018-03-21 DIAGNOSIS — J3089 Other allergic rhinitis: Secondary | ICD-10-CM | POA: Diagnosis not present

## 2018-03-21 DIAGNOSIS — R0602 Shortness of breath: Secondary | ICD-10-CM | POA: Diagnosis not present

## 2018-03-21 MED ORDER — FLUTICASONE FUROATE-VILANTEROL 100-25 MCG/INH IN AEPB: 1.0000 | INHALATION_SPRAY | Freq: Every day | RESPIRATORY_TRACT | 3 refills | Status: AC

## 2018-03-21 MED ORDER — FLUTICASONE FUROATE-VILANTEROL 100-25 MCG/INH IN AEPB: 1.0000 | INHALATION_SPRAY | Freq: Every day | RESPIRATORY_TRACT | 0 refills | Status: DC

## 2018-03-21 MED ORDER — MONTELUKAST SODIUM 10 MG PO TABS: 10.0000 mg | ORAL_TABLET | Freq: Every day | ORAL | 3 refills | Status: DC

## 2018-03-21 NOTE — Progress Notes (Signed)
Patient seen in the office today and instructed on use of Beeo 100.  Patient expressed understanding and demonstrated technique.

## 2018-03-21 NOTE — Patient Instructions (Addendum)
1) We will schedule you for formal breathing studies.  2) We will switch your inhaler to Breo Ellipta 100/25 one puff daily  3) Start singulair ( montelukast) 1 tablet daily.  The prescription has been sent to [facility].  4) we will see him in follow-up in 4 to 6 weeks time.

## 2018-03-22 ENCOUNTER — Encounter: Payer: Self-pay | Admitting: Internal Medicine

## 2018-03-26 NOTE — Progress Notes (Signed)
Subjective:    Patient ID: Joseph Good., male    DOB: 10/23/1945, 72 y.o.   MRN: [id number]  HPI She is a 72 year old lifelong never smoker who presents for evaluation and management of dyspnea.  The is kindly referred by Dr. Einar Pheasant.  The patient states that his dyspnea has been noted for approximately 6 months or so however the patient's wife states that he has been short of breath for "years".  The patient carries a diagnosis of asthma for approximately 20 years and has been prescribed albuterol and Advair.  He is only using Advair once a day and pretty much only as needed.  He notes that when he uses the medication regularly he does somewhat better.  Patient's wife notes that he has started "grunting" doing things around the house.  He has not describe any chest pain, paroxysmal nocturnal dyspnea or orthopnea.  He occasionally gets lower extremity edema.  Patient is also concerned about findings noted on a chest x-ray performed on 12 November to evaluate his dyspnea.  This shows some peribronchial cuffing as seen sometimes with bronchitis and/or asthma.  We discussed the significance of these findings and that at present no intervention is necessary.  She notes that his shortness of breath is worse with activity and made better by rest.  Patient states that he has been evaluated by a pulmonologist previously but cannot recall who this was.  He does carry a diagnosis of sleep apnea and is on CPAP at 9 cm water pressure he still has breakthrough snoring particularly when he lays on his back.  He does have significant nasal congestion and chronic "stopped up" nose.  He has not had any cough or sputum production.  No hemoptysis.  No fevers, chills or sweats.  Past medical history, surgical history and family history have been reviewed and are as noted.  He is a lifelong never smoker.  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     Review of Systems  Constitutional: Negative.   HENT: Positive for congestion and postnasal drip.   Eyes: Negative.   Respiratory: Positive for shortness of breath.   Cardiovascular: Negative.   Gastrointestinal: Negative.   Endocrine: Negative.   Genitourinary: Negative.   Musculoskeletal: Positive for joint swelling.  Skin: Negative.   Allergic/Immunologic: Positive for environmental allergies.  Neurological: Negative.   Hematological: Negative.   Psychiatric/Behavioral: Negative.   All other systems reviewed and are negative.      Objective:   Physical Exam  Constitutional: He is oriented to person, place, and time. He appears well-developed and well-nourished. No distress.  HENT:  Head: Normocephalic and atraumatic.  Right Ear: External ear normal.  Left Ear: External ear normal.  Mouth/Throat: Oropharynx is clear and moist. No oropharyngeal exudate.  Mild turbinate edema with boggy nasal mucosa with bluish discoloration.  Eyes: Pupils are equal, round, and reactive to light. Conjunctivae and EOM are normal. No scleral icterus.  Neck: Neck supple. No JVD present. No tracheal deviation present. No thyromegaly present.  Cardiovascular: Normal rate, regular rhythm, normal heart sounds and intact distal pulses.  Pulmonary/Chest: Effort normal and breath sounds normal. No respiratory  distress. He has no wheezes. He has no rales. He exhibits no tenderness.  Abdominal: Soft. Bowel sounds are normal. He exhibits no distension.  Musculoskeletal: Normal range of motion. He exhibits no edema.  Lymphadenopathy:    He has no cervical adenopathy.  Neurological: He is alert and oriented to person, place, and time.  No focal deficit  Skin: Skin is  warm and dry. No rash noted. He is not diaphoretic. No erythema. No pallor.  Psychiatric: He has a normal mood and affect. His behavior is normal. Judgment and thought content normal.  Nursing note and vitals reviewed.   Spirometry was performed today FEV1 was 2.6 L or 83% predicted FVC was 3.5 L or 83% predicted this is the low end of normal.      Assessment & Plan:   1) Asthma: Uncertain severity at this point.  Spirometry today showed no obstruction however the patient notes that he is having a "good day".  We will procure full pulmonary function testing to evaluate this issue further.  The patient is not using Advair inappropriately so we will switch him to [facility].  He seems to believe that the inhaler does help him with his sensation of dyspnea.  The patient was taught the proper use of the [facility].  Hopefully this medication will keep him better controlled as it is a truly once a day medication.  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  He should consider stopping this altogether.  We will place him on montelukast to help with this issue.  Ultimately he needs allergen avoidance.  3) Dyspnea: Told the patient is attributing this to asthma by my evaluation today patient appears to have mostly mild persistent asthma and his dyspnea appears to be out of proportion to what was noted on spirometry today.  If complete pulmonary function testing shows no significant findings would recommend cardiac evaluation.  Thank you for allowing [place] to participate in this patient's care.  We will see him in follow-up in 4 to 6 weeks time he is to contact us prior to that time should any new difficulties arise.

## 2018-04-02 ENCOUNTER — Encounter: Payer: Self-pay | Admitting: Internal Medicine

## 2018-04-11 DIAGNOSIS — R0602 Shortness of breath: Secondary | ICD-10-CM | POA: Diagnosis not present

## 2018-04-12 ENCOUNTER — Other Ambulatory Visit: Payer: Self-pay | Admitting: Internal Medicine

## 2018-04-13 ENCOUNTER — Other Ambulatory Visit: Payer: Self-pay | Admitting: Internal Medicine

## 2018-04-17 ENCOUNTER — Ambulatory Visit: Payer: Medicare Other | Attending: Pulmonary Disease

## 2018-04-17 DIAGNOSIS — J45909 Unspecified asthma, uncomplicated: Secondary | ICD-10-CM | POA: Diagnosis not present

## 2018-04-17 MED ORDER — ALBUTEROL SULFATE (2.5 MG/3ML) 0.083% IN NEBU
2.5000 mg | INHALATION_SOLUTION | Freq: Once | RESPIRATORY_TRACT | Status: AC
  Administered 2018-04-17: 2.5 mg via RESPIRATORY_TRACT
  Filled 2018-04-17: qty 3

## 2018-04-21 ENCOUNTER — Ambulatory Visit (INDEPENDENT_AMBULATORY_CARE_PROVIDER_SITE_OTHER): Payer: Medicare Other | Admitting: Pulmonary Disease

## 2018-04-21 ENCOUNTER — Encounter: Payer: Self-pay | Admitting: Pulmonary Disease

## 2018-04-21 VITALS — BP 138/86 | HR 62 | Ht 69.0 in | Wt 232.6 lb

## 2018-04-21 DIAGNOSIS — J3089 Other allergic rhinitis: Secondary | ICD-10-CM | POA: Diagnosis not present

## 2018-04-21 DIAGNOSIS — J4530 Mild persistent asthma, uncomplicated: Secondary | ICD-10-CM | POA: Diagnosis not present

## 2018-04-21 DIAGNOSIS — R0602 Shortness of breath: Secondary | ICD-10-CM

## 2018-04-21 NOTE — Patient Instructions (Signed)
1. Continue Breo  2. Start Singulair daily.  3. Follow-up in 3 months.

## 2018-04-21 NOTE — Progress Notes (Signed)
Subjective:    Patient ID: Joseph Good., male    DOB: 05-May-1945, 72 y.o.   MRN: [id number]  HPI Patient is a 72 year old lifelong never smoker who presents for follow-up after an initial evaluation for dyspnea.  He was seen initially on 21 March 2018.  Evaluation at that time was consistent with asthma triggered by perennial allergic rhinitis.  He was switched to [place] from Advair given that he was only using Advair once a day noticed worsening of his symptoms towards the end of the day.  Breo at least gives him a true 24-hour duration of medication.  He was also provided with Singulair which he has not started yet.  Patient states that he does not note any difference between the [facility] or the Advair however his wife who is with him today states that she does not hear him "grunting" while he breathes during the day and notices that he has more stamina and does not seem breathless during the day.  Patient continues to have significant nasal symptoms but again he did not start Singulair as directed.  He is up-to-date on flu vaccine.  Not endorse any fevers, chills or sweats.  No orthopnea or paroxysmal nocturnal dyspnea and no lower extremity edema.  His dyspnea on exertion has resolved.  The patient had pulmonary function testing performed on 19 December which were essentially normal.  He does have evidence of mild hyperinflation and increased airway resistance likely related to mild asthma.    Review of Systems  Constitutional: Negative.   HENT: Positive for congestion, postnasal drip and sinus pressure.   Eyes: Negative.   Respiratory: Negative.        Respiratory issues since starting Breo.  Cardiovascular: Negative.   Gastrointestinal: Negative.   Musculoskeletal: Negative.   Neurological: Negative.   All other systems reviewed and are negative.      Objective:   Physical Exam  Constitutional: He is oriented to person, place, and time. He appears well-developed and  well-nourished. No distress.  HENT:  Head: Normocephalic and atraumatic.  Right Ear: External ear normal.  Left Ear: External ear normal.  Mouth/Throat: Oropharynx is clear and moist. No oropharyngeal exudate.  Mild turbinate edema with boggy nasal mucosa with bluish discoloration.   No significant change from prior. Eyes: Pupils are equal, round, and reactive to light. Conjunctivae and EOM are normal. No scleral icterus.  Neck: Neck supple. No JVD present. No tracheal deviation present. Cardiovascular: Normal rate, regular rhythm, normal heart sounds and intact distal pulses.  Pulmonary/Chest: Effort normal and breath sounds normal. No respiratory distress. Abdominal: Soft. Bowel sounds are normal. He exhibits no distension.  Musculoskeletal: Normal range of motion. He exhibits no edema.  Lymphadenopathy:    He has no cervical adenopathy.  Neurological: He is alert and oriented to person, place, and time.  No focal deficit  Skin: Skin is warm and dry. No rash noted. He is not diaphoretic. No erythema. No pallor.  Psychiatric: He has a normal mood and affect. His behavior is normal. Judgment and thought content normal.  Nursing note and vitals reviewed.      Assessment & Plan:  1) Asthma:  Suspect that the patient has mild persistent asthma.  His teeth show slight improvement from his office spirometry.  He is asymptomatic on Breo Ellipta.  Continue same for now.   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  It  appears that this activity seems to trigger his symptoms and worsen his sensation of dyspnea.  He should consider stopping this altogether.  Was instructed to start montelukast which was prescribed on his prior visit, to help with this issue.  Ultimately he needs allergen avoidance.  3) Dyspnea:  The symptom appears to have resolved with the use of Breo.  Should he develop further issues with this  would recommend cardiac evaluation as the patient has very mild pulmonary dysfunction.

## 2018-04-28 DIAGNOSIS — Z8669 Personal history of other diseases of the nervous system and sense organs: Secondary | ICD-10-CM | POA: Diagnosis not present

## 2018-04-28 DIAGNOSIS — Z8639 Personal history of other endocrine, nutritional and metabolic disease: Secondary | ICD-10-CM | POA: Diagnosis not present

## 2018-04-28 DIAGNOSIS — R0602 Shortness of breath: Secondary | ICD-10-CM | POA: Diagnosis not present

## 2018-04-28 DIAGNOSIS — R9431 Abnormal electrocardiogram [ECG] [EKG]: Secondary | ICD-10-CM | POA: Diagnosis not present

## 2018-05-05 ENCOUNTER — Telehealth: Payer: Self-pay | Admitting: Pulmonary Disease

## 2018-05-07 NOTE — Telephone Encounter (Signed)
I spoke to patient regarding new CPAP. He stated Dr. Nehemiah Massed thinks he needs a new sleep study as well. I set up an apt with Dr. Mortimer Fries, Dr. Patsey Berthold is aware.

## 2018-05-19 ENCOUNTER — Other Ambulatory Visit: Payer: Self-pay | Admitting: Internal Medicine

## 2018-05-20 ENCOUNTER — Ambulatory Visit (INDEPENDENT_AMBULATORY_CARE_PROVIDER_SITE_OTHER): Payer: Medicare Other | Admitting: Internal Medicine

## 2018-05-20 ENCOUNTER — Encounter: Payer: Self-pay | Admitting: Internal Medicine

## 2018-05-20 VITALS — BP 124/80 | HR 57 | Ht 69.0 in | Wt 235.2 lb

## 2018-05-20 DIAGNOSIS — G4719 Other hypersomnia: Secondary | ICD-10-CM

## 2018-05-20 DIAGNOSIS — G4733 Obstructive sleep apnea (adult) (pediatric): Secondary | ICD-10-CM | POA: Diagnosis not present

## 2018-05-20 MED ORDER — ZOLPIDEM TARTRATE ER 6.25 MG PO TBCR: 6.2500 mg | EXTENDED_RELEASE_TABLET | Freq: Once | ORAL | 0 refills | Status: DC

## 2018-05-20 NOTE — Patient Instructions (Addendum)
Obtain Sleep Study Split Night  Mask fitting referral

## 2018-05-20 NOTE — Progress Notes (Signed)
Name: Joseph Good. MRN: [id number] DOB: July 20, 1945     CONSULTATION DATE: 05/20/18 REFERRING MD : Duwayne Heck  CHIEF COMPLAINT: excessive daytime sleepiness  STUDIES:   03/11/18  CXR independently reviewed by Me No effusions No pneumonia No edema   HISTORY OF PRESENT ILLNESS:  seen today for problems with sleep Patient  has been having sleep problems for many years Dx with OSA 13 years ago Patient has been having excessive daytime sleepiness Patient has been having extreme fatigue and tiredness, lack of energy Doing much better with CPAP therapy Compliance report not available +  very Loud snoring every night without mask + struggling breathe at night and gasps for air without mask   Discussed sleep data and reviewed with patient.  Encouraged proper weight management.  Discussed driving precautions and its relationship with hypersomnolence.  Discussed operating dangerous equipment and its relationship with hypersomnolence.  Discussed sleep hygiene, and benefits of a fixed sleep waked time.  The importance of getting eight or more hours of sleep discussed with patient.  Discussed limiting the use of the computer and television before bedtime.  Decrease naps during the day, so night time sleep will become enhanced.  Limit caffeine, and sleep deprivation.  HTN, stroke, and heart failure are potential risk factors.    PAST MEDICAL HISTORY :   has a past medical history of Asthma, Bladder outlet obstruction, Crohn's disease ([place]), GERD (gastroesophageal reflux disease), Hypercholesterolemia, Restless leg syndrome, Seizure disorder ([place]), Skin cancer, basal cell, and Sleep apnea.  has a past surgical history that includes Tonsillectomy and adenoidectomy (1956); Nasal polyp surgery (1963); Knee arthroscopy (1982); Appendectomy (1992); and inguinal hernia surgery (1999). Prior to Admission medications   Medication Sig Start Date End Date Taking? Authorizing Provider    albuterol (PROVENTIL HFA;VENTOLIN HFA) 108 (90 Base) MCG/ACT inhaler Inhale 2 puffs into the lungs every 6 (six) hours as needed for wheezing or shortness of breath. 03/11/18   Einar Pheasant, MD  CALCIUM CITRATE PO Take 1 tablet by mouth daily.    [provider]  Cholecalciferol (VITAMIN D-3) 1000 UNITS CAPS Take 1 capsule by mouth daily.    [provider]  finasteride (PROSCAR) 5 MG tablet TAKE 1 TABLET DAILY 05/20/18   Einar Pheasant, MD  fluticasone furoate-vilanterol (BREO ELLIPTA) 100-25 MCG/INH AEPB Inhale 1 puff into the lungs daily. 03/21/18 03/16/19  Tyler Pita, MD  fluticasone furoate-vilanterol (BREO ELLIPTA) 100-25 MCG/INH AEPB Inhale 1 puff into the lungs daily. 03/21/18   Tyler Pita, MD  Fluticasone-Salmeterol (ADVAIR DISKUS) 100-50 MCG/DOSE AEPB INHALE 1 PUFF EVERY DAY 08/05/17   Einar Pheasant, MD  Magnesium 250 MG TABS Two tablets q day    [provider]  montelukast (SINGULAIR) 10 MG tablet Take 1 tablet (10 mg total) by mouth daily. Patient not taking: Reported on 04/21/2018 03/21/18 03/16/19  Tyler Pita, MD  omeprazole (PRILOSEC) 20 MG capsule TAKE 1 CAPSULE (20 MG TOTAL) BY MOUTH 2 (TWO) TIMES DAILY BEFORE A MEAL. 04/14/18   Einar Pheasant, MD  pramipexole (MIRAPEX) 0.25 MG tablet TAKE 1 AND 1/2 TABLETS     (=0.375MG  TOTAL) DAILY 03/14/18   Einar Pheasant, MD  pravastatin (PRAVACHOL) 20 MG tablet TAKE 1 TABLET DAILY 04/14/18   Einar Pheasant, MD   Allergies  Allergen Reactions  . Seldane [Terfenadine]     FAMILY HISTORY:  family history includes Congestive Heart Failure in his mother; Heart disease in his father. SOCIAL HISTORY:  reports that he has never  smoked. He has never used smokeless tobacco. He reports that he does not drink alcohol or use drugs.  REVIEW OF SYSTEMS:   Constitutional: Negative for fever, chills, weight loss, malaise/fatigue and diaphoresis.  HENT: Negative for hearing loss, ear pain,  nosebleeds, congestion, sore throat, neck pain, tinnitus and ear discharge.   Eyes: Negative for blurred vision, double vision, photophobia, pain, discharge and redness.  Respiratory: Negative for cough, hemoptysis, sputum production, shortness of breath, wheezing and stridor.   Cardiovascular: Negative for chest pain, palpitations, orthopnea, claudication, leg swelling and PND.  Gastrointestinal: Negative for heartburn, nausea, vomiting, abdominal pain, diarrhea, constipation, blood in stool and melena.  Genitourinary: Negative for dysuria, urgency, frequency, hematuria and flank pain.  Musculoskeletal: Negative for myalgias, back pain, joint pain and falls.  Skin: Negative for itching and rash.  Neurological: Negative for dizziness, tingling, tremors, sensory change, speech change, focal weakness, seizures, loss of consciousness, weakness and headaches.  Endo/Heme/Allergies: Negative for environmental allergies and polydipsia. Does not bruise/bleed easily.  ALL OTHER ROS ARE NEGATIVE  BP 124/80 (BP Location: Left Arm, Cuff Size: Normal)   Pulse (!) 57   Ht 5\' 9"  (1.753 m)   Wt 235 lb 3.2 oz (106.7 kg)   SpO2 95%   BMI 34.73 kg/m    Physical Examination:   GENERAL:NAD, no fevers, chills, no weakness no fatigue HEAD: Normocephalic, atraumatic.  EYES: Pupils equal, round, reactive to light. Extraocular muscles intact. No scleral icterus.  MOUTH: Moist mucosal membrane.   EAR, NOSE, THROAT: Clear without exudates. No external lesions.  NECK: Supple. No thyromegaly. No nodules. No JVD.  PULMONARY:CTA B/L no wheezes, no crackles, no rhonchi CARDIOVASCULAR: S1 and S2. Regular rate and rhythm. No murmurs, rubs, or gallops. No edema.  GASTROINTESTINAL: Soft, nontender, nondistended. No masses. Positive bowel sounds.  MUSCULOSKELETAL: No swelling, clubbing, or edema. Range of motion full in all extremities.  NEUROLOGIC: Cranial nerves II through XII are intact. No gross focal neurological  deficits.  SKIN: No ulceration, lesions, rashes, or cyanosis. Skin warm and dry. Turgor intact.  PSYCHIATRIC: Mood, affect within normal limits. The patient is awake, alert and oriented x 3. Insight, judgment intact.      ASSESSMENT / PLAN: 73 yo pleasant white male with OSA  Patient dx 13 years ago and has not had repeat testing Wife states that sometimes he has snoring episodes with apnea spells  Patient will need Split night sleep study to re-assess OSA     Patient/Family are satisfied with Plan of action and management. All questions answered Follow up after test completed   Corrin Parker, M.D.  [facility]  Medical Director [place] Director [facility] Cardio-Pulmonary Department

## 2018-06-03 ENCOUNTER — Other Ambulatory Visit: Payer: Self-pay | Admitting: Internal Medicine

## 2018-06-06 ENCOUNTER — Ambulatory Visit: Payer: Medicare Other | Attending: Internal Medicine

## 2018-06-06 DIAGNOSIS — G4710 Hypersomnia, unspecified: Secondary | ICD-10-CM | POA: Diagnosis not present

## 2018-06-06 DIAGNOSIS — R0683 Snoring: Secondary | ICD-10-CM | POA: Insufficient documentation

## 2018-06-06 DIAGNOSIS — G4761 Periodic limb movement disorder: Secondary | ICD-10-CM | POA: Diagnosis not present

## 2018-06-10 DIAGNOSIS — G4710 Hypersomnia, unspecified: Secondary | ICD-10-CM | POA: Diagnosis not present

## 2018-06-10 NOTE — Telephone Encounter (Signed)
Called and spoke to [place] with [facility].  Per Sharyn Lull, pt did show for sleep study. Sharyn Lull stated that [facility] does not have access to change status of appointments in epic.  Per epic- sleep study was scheduled for [facility] sleep study center.  Sharyn Lull was unsure as to why it was scheduled like that.

## 2018-06-16 ENCOUNTER — Telehealth: Payer: Self-pay

## 2018-06-16 NOTE — Telephone Encounter (Signed)
Spoke to patient regarding his sleep study. Per the study, Obstructive Sleep Apnea was NOT seen. There were 144 snoring episodes.   Let patient know he is to be seen back in the office with Dr. Mortimer Fries. Patient is aware and will call us back to schedule. Sleep study mailed to patient at home address. Nothing further needed at this time.

## 2018-06-19 ENCOUNTER — Telehealth: Payer: Self-pay | Admitting: Internal Medicine

## 2018-06-19 NOTE — Telephone Encounter (Signed)
New Message   PT is calling because he would like a copy of his sleep study. He says he can go to the office to pick it up.  Please call

## 2018-06-20 ENCOUNTER — Ambulatory Visit: Payer: Federal, State, Local not specified - PPO | Admitting: Internal Medicine

## 2018-06-20 NOTE — Telephone Encounter (Signed)
To [employer] [facility] [employer]).

## 2018-06-20 NOTE — Telephone Encounter (Signed)
Pt notified copy of sleep study will be placed at front for pickup. Nothing further needed.

## 2018-07-10 DIAGNOSIS — R0683 Snoring: Secondary | ICD-10-CM | POA: Diagnosis not present

## 2018-07-15 ENCOUNTER — Ambulatory Visit (INDEPENDENT_AMBULATORY_CARE_PROVIDER_SITE_OTHER): Payer: Medicare Other | Admitting: Internal Medicine

## 2018-07-15 ENCOUNTER — Encounter: Payer: Self-pay | Admitting: Internal Medicine

## 2018-07-15 ENCOUNTER — Other Ambulatory Visit: Payer: Self-pay

## 2018-07-15 DIAGNOSIS — J4520 Mild intermittent asthma, uncomplicated: Secondary | ICD-10-CM | POA: Diagnosis not present

## 2018-07-15 DIAGNOSIS — G4730 Sleep apnea, unspecified: Secondary | ICD-10-CM | POA: Diagnosis not present

## 2018-07-15 DIAGNOSIS — E7800 Pure hypercholesterolemia, unspecified: Secondary | ICD-10-CM

## 2018-07-15 DIAGNOSIS — R7989 Other specified abnormal findings of blood chemistry: Secondary | ICD-10-CM

## 2018-07-15 DIAGNOSIS — G2581 Restless legs syndrome: Secondary | ICD-10-CM | POA: Diagnosis not present

## 2018-07-15 DIAGNOSIS — R945 Abnormal results of liver function studies: Secondary | ICD-10-CM

## 2018-07-15 NOTE — Progress Notes (Signed)
Patient ID: Joseph Good., male   DOB: 1945-09-02, 73 y.o.   MRN: [id number]   Subjective:    Patient ID: Joseph Good., male    DOB: 10-14-1945, 73 y.o.   MRN: [id number]  HPI  Patient here for a scheduled follow up.  He is accompanied by his wife.  History obtained from both of them.  He reports he is doing well.  Feels good.  Breathing doing well.  Using breo.  Breathing better.  Denies sob.  No acid reflux.  No abdominal pain.  Bowels moving.  No urine change.     Past Medical History:  Diagnosis Date  . Asthma   . Bladder outlet obstruction   . Crohn's disease ([place])    appendix, s/p appendectomy  . GERD (gastroesophageal reflux disease)    schatzki ring  . Hypercholesterolemia   . Restless leg syndrome   . Seizure disorder [facility])    age 20, previously on phenobarbital  . Skin cancer, basal cell   . Sleep apnea    Past Surgical History:  Procedure Laterality Date  . APPENDECTOMY  1992   lysis of adhesions (bowel blockage)  . inguinal hernia surgery  1999   left  . KNEE ARTHROSCOPY  1982  . [place]  . TONSILLECTOMY AND ADENOIDECTOMY  1956   Family History  Problem Relation Age of Onset  . Heart disease Father   . Congestive Heart Failure Mother   . Colon cancer Neg Hx   . Prostate cancer Neg Hx    Social History   Socioeconomic History  . Marital status: Married    Spouse name: Not on file  . Number of children: 0  . Years of education: Not on file  . Highest education level: Not on file  Occupational History  . Not on file  Social Needs  . Financial resource strain: Not hard at all  . Food insecurity:    Worry: Never true    Inability: Never true  . Transportation needs:    Medical: No    Non-medical: No  Tobacco Use  . Smoking status: Never Smoker  . Smokeless tobacco: Never Used  Substance and Sexual Activity  . Alcohol use: No    Alcohol/week: 0.0 standard drinks  . Drug use: No  . Sexual activity: Not Currently   Lifestyle  . Physical activity:    Days per week: Not on file    Minutes per session: Not on file  . Stress: Not on file  Relationships  . Social connections:    Talks on phone: Not on file    Gets together: Not on file    Attends religious service: Not on file    Active member of club or organization: Not on file    Attends meetings of clubs or organizations: Not on file    Relationship status: Not on file  Other Topics Concern  . Not on file  Social History Narrative  . Not on file    Outpatient Encounter Medications as of 07/15/2018  Medication Sig  . albuterol (PROVENTIL HFA;VENTOLIN HFA) 108 (90 Base) MCG/ACT inhaler Inhale 2 puffs into the lungs every 6 (six) hours as needed for wheezing or shortness of breath.  Marland Kitchen CALCIUM CITRATE PO Take 1 tablet by mouth daily.  . Cholecalciferol (VITAMIN D-3) 1000 UNITS CAPS Take 1 capsule by mouth daily.  . finasteride (PROSCAR) 5 MG tablet TAKE 1 TABLET DAILY  . fluticasone furoate-vilanterol (BREO ELLIPTA)  100-25 MCG/INH AEPB Inhale 1 puff into the lungs daily.  . Magnesium 250 MG TABS Two tablets q day  . montelukast (SINGULAIR) 10 MG tablet Take 1 tablet (10 mg total) by mouth daily.  Marland Kitchen omeprazole (PRILOSEC) 20 MG capsule TAKE 1 CAPSULE (20 MG TOTAL) BY MOUTH 2 (TWO) TIMES DAILY BEFORE A MEAL.  . pramipexole (MIRAPEX) 0.25 MG tablet TAKE 1 AND 1/2 TABLETS     (=0.375MG  TOTAL) DAILY  . pravastatin (PRAVACHOL) 20 MG tablet TAKE 1 TABLET DAILY  . zolpidem (AMBIEN CR) 6.25 MG CR tablet Take 1 tablet (6.25 mg total) by mouth once for 1 dose.   No facility-administered encounter medications on file as of 07/15/2018.     Review of Systems  Constitutional: Negative for appetite change and unexpected weight change.  HENT: Negative for congestion and sinus pressure.   Respiratory: Negative for cough, chest tightness and shortness of breath.   Cardiovascular: Negative for chest pain, palpitations and leg swelling.  Gastrointestinal: Negative  for abdominal pain, diarrhea, nausea and vomiting.  Genitourinary: Negative for difficulty urinating and dysuria.  Musculoskeletal: Negative for joint swelling and myalgias.  Skin: Negative for color change and rash.  Neurological: Negative for dizziness, light-headedness and headaches.  Psychiatric/Behavioral: Negative for agitation and dysphoric mood.       Objective:    Physical Exam Constitutional:      General: He is not in acute distress.    Appearance: Normal appearance. He is well-developed.  HENT:     Nose: Nose normal. No congestion.     Mouth/Throat:     Pharynx: No oropharyngeal exudate or posterior oropharyngeal erythema.  Neck:     Musculoskeletal: Neck supple. No muscular tenderness.  Cardiovascular:     Rate and Rhythm: Normal rate and regular rhythm.  Pulmonary:     Effort: Pulmonary effort is normal. No respiratory distress.     Breath sounds: Normal breath sounds.  Abdominal:     General: Bowel sounds are normal.     Palpations: Abdomen is soft.     Tenderness: There is no abdominal tenderness.  Musculoskeletal:        General: No swelling or tenderness.  Lymphadenopathy:     Cervical: No cervical adenopathy.  Skin:    Findings: No erythema or rash.  Neurological:     Mental Status: He is alert.  Psychiatric:        Mood and Affect: Mood normal.        Behavior: Behavior normal.     BP 130/80   Pulse 65   Temp 98.3 F (36.8 C) (Oral)   Wt 233 lb 3.2 oz (105.8 kg)   SpO2 95%   BMI 34.44 kg/m  Wt Readings from Last 3 Encounters:  07/15/18 233 lb 3.2 oz (105.8 kg)  05/20/18 235 lb 3.2 oz (106.7 kg)  04/21/18 232 lb 9.6 oz (105.5 kg)     Lab Results  Component Value Date   WBC 5.4 03/20/2018   HGB 15.8 03/20/2018   HCT 46.1 03/20/2018   PLT 239.0 03/20/2018   GLUCOSE 90 03/20/2018   CHOL 166 03/20/2018   TRIG 143.0 03/20/2018   HDL 39.70 03/20/2018   LDLDIRECT 154.7 05/22/2013   LDLCALC 98 03/20/2018   ALT 17 03/20/2018   AST 45  (H) 03/20/2018   NA 139 03/20/2018   K 4.2 03/20/2018   CL 101 03/20/2018   CREATININE 1.25 03/20/2018   BUN 16 03/20/2018   CO2 32 03/20/2018   TSH  1.90 03/20/2018   PSA 1.12 03/20/2018   INR 1.00 11/17/2016    Mr Brain W ZO Contrast  Result Date: 12/01/2016 CLINICAL DATA:  History of seizure.  Head seizures as a youth. EXAM: MRI HEAD WITHOUT AND WITH CONTRAST TECHNIQUE: Multiplanar, multiecho pulse sequences of the brain and surrounding structures were obtained without and with intravenous contrast. CONTRAST:  73mL MULTIHANCE GADOBENATE DIMEGLUMINE 529 MG/ML IV SOLN COMPARISON:  Head CT 11/17/2016 FINDINGS: Brain: No explanation for seizure. No cortical scarring or mass. Accounting for remnant cysts, the hippocampi are symmetric and unremarkable. Normal brain volume. Very mild hyperintensity in the cerebral white matter, usually attributed to remote microvascular insults. Lacunar infarcts described on previous head CT are instead dilated perivascular spaces. No specific demyelinating pattern. Vascular: Negative. Skull and upper cervical spine: Negative for marrow lesion Sinuses/Orbits: Focal right maxillary sinusitis with T2 hypointense fluid level that may be proteinaceous. Bilateral cataract resection. IMPRESSION: 1. No explanation for seizure.  Unremarkable brain MRI. 2. Structures described is lacunar infarcts on head CT 11/17/2016 are instead dilated perivascular spaces. 3. Right maxillary sinusitis. Electronically Signed   By: Monte Fantasia M.D.   On: 12/01/2016 15:19       Assessment & Plan:   Problem List Items Addressed This Visit    Abnormal liver function test    Has been stable.  Diet and exercise.  Follow liver function tests.        Relevant Orders   Hepatic function panel   Asthma    Breathing doing better. Denies sob.  Using Breo.       Hypercholesterolemia    Low cholesterol diet and exercise.  On pravastatin.  Follow lipid panel and liver function tests.         Relevant Orders   Lipid panel   Basic metabolic panel   Restless leg syndrome    Stable on current regimen.  Has seen neurology.       Sleep apnea    Using cpap.            Einar Pheasant, MD

## 2018-07-19 ENCOUNTER — Encounter: Payer: Self-pay | Admitting: Internal Medicine

## 2018-07-19 NOTE — Assessment & Plan Note (Signed)
Low cholesterol diet and exercise.  On pravastatin.  Follow lipid panel and liver function tests.   

## 2018-07-19 NOTE — Assessment & Plan Note (Signed)
Stable on current regimen.  Has seen neurology.

## 2018-07-19 NOTE — Assessment & Plan Note (Signed)
Using cpap

## 2018-07-19 NOTE — Assessment & Plan Note (Signed)
Breathing doing better. Denies sob.  Using Breo.

## 2018-07-19 NOTE — Assessment & Plan Note (Signed)
Has been stable.  Diet and exercise.  Follow liver function tests.

## 2018-08-24 ENCOUNTER — Other Ambulatory Visit: Payer: Self-pay | Admitting: Internal Medicine

## 2018-09-19 ENCOUNTER — Other Ambulatory Visit: Payer: Federal, State, Local not specified - PPO

## 2018-10-03 ENCOUNTER — Other Ambulatory Visit: Payer: Self-pay | Admitting: Internal Medicine

## 2018-10-06 ENCOUNTER — Other Ambulatory Visit: Payer: Self-pay | Admitting: Internal Medicine

## 2018-10-20 ENCOUNTER — Other Ambulatory Visit: Payer: Self-pay | Admitting: Internal Medicine

## 2018-11-26 ENCOUNTER — Telehealth: Payer: Self-pay

## 2018-11-26 ENCOUNTER — Other Ambulatory Visit: Payer: Federal, State, Local not specified - PPO

## 2018-11-26 NOTE — Telephone Encounter (Signed)
Called and spoke to pt since phone message was unclear about COVID being the reason for cancellation of appointment.  Called to see if pt had tested positive for COVID.  Pt said that he wanted to cancel lab appt because of the pandemic and he didn't want to be exposed.  Cancelled pt's lab appt for today.

## 2018-11-26 NOTE — Telephone Encounter (Signed)
Copied from [place] [phone]. Topic: Quick Communication - Appointment Cancellation >> Nov 26, 2018  9:22 AM Scherrie Gerlach wrote: Patient called to cancel lab appointment scheduled for 9:15 today.. Patient said because of covid. Unable to reach office/ no answer

## 2018-12-05 ENCOUNTER — Other Ambulatory Visit: Payer: Self-pay

## 2018-12-05 ENCOUNTER — Ambulatory Visit (INDEPENDENT_AMBULATORY_CARE_PROVIDER_SITE_OTHER): Payer: Medicare Other | Admitting: Internal Medicine

## 2018-12-05 DIAGNOSIS — E7800 Pure hypercholesterolemia, unspecified: Secondary | ICD-10-CM

## 2018-12-05 DIAGNOSIS — R7989 Other specified abnormal findings of blood chemistry: Secondary | ICD-10-CM

## 2018-12-05 DIAGNOSIS — G4730 Sleep apnea, unspecified: Secondary | ICD-10-CM

## 2018-12-05 DIAGNOSIS — R945 Abnormal results of liver function studies: Secondary | ICD-10-CM

## 2018-12-05 DIAGNOSIS — K219 Gastro-esophageal reflux disease without esophagitis: Secondary | ICD-10-CM | POA: Diagnosis not present

## 2018-12-05 DIAGNOSIS — G2581 Restless legs syndrome: Secondary | ICD-10-CM | POA: Diagnosis not present

## 2018-12-05 DIAGNOSIS — J4520 Mild intermittent asthma, uncomplicated: Secondary | ICD-10-CM

## 2018-12-05 MED ORDER — PRAMIPEXOLE DIHYDROCHLORIDE 0.25 MG PO TABS: ORAL_TABLET | ORAL | 0 refills | Status: DC

## 2018-12-05 NOTE — Progress Notes (Signed)
Patient ID: Joseph Glimpse., male   DOB: March 29, 1946, 73 y.o.   MRN: [id number]   Virtual Visit via telephone Note  This visit type was conducted due to national recommendations for restrictions regarding the COVID-19 pandemic (e.g. social distancing).  This format is felt to be most appropriate for this patient at this time.  All issues noted in this document were discussed and addressed.  No physical exam was performed (except for noted visual exam findings with Video Visits).   I connected with Joseph Good by  telephone and verified that I am speaking with the correct person using two identifiers. Location patient: home Location provider: work Persons participating in the virtual visit: patient, provider  I discussed the limitations, risks, security and privacy concerns of performing an evaluation and management service by telephone and the availability of in person appointments. The patient expressed understanding and agreed to proceed.   Reason for visit:  Scheduled follow up.    HPI: He reports he is doing well.  Feels good.  Retired.  Staying active.  Discussed diet and exercise.  No chest pain.  No sob.  No acid reflux.  No abdominal pain.  Bowels moving.  With restless legs.  On mirapex.  He increased his dose to two per night.  This helped and he slept better.  Request to have dose adjusted on mirapex.  Staying in due to covid restrictions.  No fever.  No cough or congestion.     ROS: See pertinent positives and negatives per HPI.  Past Medical History:  Diagnosis Date  . Asthma   . Bladder outlet obstruction   . Crohn's disease ([place])    appendix, s/p appendectomy  . GERD (gastroesophageal reflux disease)    schatzki ring  . Hypercholesterolemia   . Restless leg syndrome   . Seizure disorder [facility])    age 54, previously on phenobarbital  . Skin cancer, basal cell   . Sleep apnea     Past Surgical History:  Procedure Laterality Date  . APPENDECTOMY  1992   lysis  of adhesions (bowel blockage)  . inguinal hernia surgery  1999   left  . KNEE ARTHROSCOPY  1982  . [place]  . TONSILLECTOMY AND ADENOIDECTOMY  1956    Family History  Problem Relation Age of Onset  . Heart disease Father   . Congestive Heart Failure Mother   . Colon cancer Neg Hx   . Prostate cancer Neg Hx     SOCIAL HX: reviewed.    Current Outpatient Medications:  .  albuterol (PROVENTIL HFA;VENTOLIN HFA) 108 (90 Base) MCG/ACT inhaler, Inhale 2 puffs into the lungs every 6 (six) hours as needed for wheezing or shortness of breath., Disp: 1 Inhaler, Rfl: 2 .  CALCIUM CITRATE PO, Take 1 tablet by mouth daily., Disp: , Rfl:  .  Cholecalciferol (VITAMIN D-3) 1000 UNITS CAPS, Take 1 capsule by mouth daily., Disp: , Rfl:  .  finasteride (PROSCAR) 5 MG tablet, TAKE 1 TABLET DAILY, Disp: 90 tablet, Rfl: 1 .  fluticasone furoate-vilanterol (BREO ELLIPTA) 100-25 MCG/INH AEPB, Inhale 1 puff into the lungs daily., Disp: 3 each, Rfl: 3 .  Magnesium 250 MG TABS, Two tablets q day, Disp: , Rfl:  .  montelukast (SINGULAIR) 10 MG tablet, Take 1 tablet (10 mg total) by mouth daily., Disp: 90 tablet, Rfl: 3 .  omeprazole (PRILOSEC) 20 MG capsule, TAKE 1 CAPSULE (20 MG TOTAL) BY MOUTH 2 (TWO) TIMES DAILY BEFORE  A MEAL., Disp: 180 capsule, Rfl: 1 .  pramipexole (MIRAPEX) 0.25 MG tablet, Two tablets q hs, Disp: 180 tablet, Rfl: 0 .  pravastatin (PRAVACHOL) 20 MG tablet, TAKE 1 TABLET DAILY, Disp: 90 tablet, Rfl: 1 .  zolpidem (AMBIEN CR) 6.25 MG CR tablet, Take 1 tablet (6.25 mg total) by mouth once for 1 dose., Disp: 1 tablet, Rfl: 0  EXAM:  VITALS per patient if applicable: 109/32, 61  GENERAL: alert.  Sounds to be in no acute distress.  Answering questions appropriately.    PSYCH/NEURO: pleasant and cooperative, no obvious depression or anxiety, speech and thought processing grossly intact  ASSESSMENT AND PLAN:  Discussed the following assessment and plan:  Abnormal liver  function test Diet and exercise.  Follow liver panel.    Asthma Breathing stable.    GERD (gastroesophageal reflux disease) Controlled on omeprazole.    Hypercholesterolemia On pravastatin.  Low cholesterol diet and exercise.  Follow lipid panel and liver function tests.    Restless leg syndrome Has done well on mirapex.  Increase to two per night.  Follow.    Sleep apnea Using cpap.      I discussed the assessment and treatment plan with the patient. The patient was provided an opportunity to ask questions and all were answered. The patient agreed with the plan and demonstrated an understanding of the instructions.   The patient was advised to call back or seek an in-person evaluation if the symptoms worsen or if the condition fails to improve as anticipated.  I provided 15 minutes of non-face-to-face time during this encounter.   Einar Pheasant, MD

## 2018-12-07 ENCOUNTER — Encounter: Payer: Self-pay | Admitting: Internal Medicine

## 2018-12-07 NOTE — Assessment & Plan Note (Signed)
Has done well on mirapex.  Increase to two per night.  Follow.

## 2018-12-07 NOTE — Assessment & Plan Note (Signed)
Breathing stable.

## 2018-12-07 NOTE — Assessment & Plan Note (Signed)
On pravastatin.  Low cholesterol diet and exercise.  Follow lipid panel and liver function tests.   

## 2018-12-07 NOTE — Assessment & Plan Note (Signed)
Using cpap

## 2018-12-07 NOTE — Assessment & Plan Note (Signed)
Controlled on omeprazole.   

## 2018-12-07 NOTE — Assessment & Plan Note (Signed)
Diet and exercise.  Follow liver panel.   

## 2018-12-23 ENCOUNTER — Other Ambulatory Visit: Payer: Self-pay | Admitting: *Deleted

## 2018-12-23 ENCOUNTER — Encounter: Payer: Self-pay | Admitting: Internal Medicine

## 2018-12-23 MED ORDER — PRAVASTATIN SODIUM 20 MG PO TABS: 20.0000 mg | ORAL_TABLET | Freq: Every day | ORAL | 1 refills | Status: DC

## 2019-01-19 ENCOUNTER — Encounter: Payer: Self-pay | Admitting: Internal Medicine

## 2019-01-19 NOTE — Telephone Encounter (Signed)
Patient requesting 0.5 mg increase OK? Instead of taking 2 0.25mg  tablets ok to change?

## 2019-01-20 MED ORDER — PRAMIPEXOLE DIHYDROCHLORIDE 0.5 MG PO TABS: 0.5000 mg | ORAL_TABLET | Freq: Every day | ORAL | 1 refills | Status: DC

## 2019-01-20 NOTE — Telephone Encounter (Signed)
rx ok'd for mirapex .5mg  q hs #90 with one refill.

## 2019-02-03 ENCOUNTER — Ambulatory Visit: Payer: Federal, State, Local not specified - PPO

## 2019-02-03 ENCOUNTER — Other Ambulatory Visit (INDEPENDENT_AMBULATORY_CARE_PROVIDER_SITE_OTHER): Payer: Medicare Other

## 2019-02-03 ENCOUNTER — Other Ambulatory Visit: Payer: Self-pay

## 2019-02-03 DIAGNOSIS — E7800 Pure hypercholesterolemia, unspecified: Secondary | ICD-10-CM

## 2019-02-03 DIAGNOSIS — Z23 Encounter for immunization: Secondary | ICD-10-CM | POA: Diagnosis not present

## 2019-02-03 DIAGNOSIS — R945 Abnormal results of liver function studies: Secondary | ICD-10-CM | POA: Diagnosis not present

## 2019-02-03 DIAGNOSIS — R7989 Other specified abnormal findings of blood chemistry: Secondary | ICD-10-CM

## 2019-02-03 LAB — LIPID PANEL
Cholesterol: 164 mg/dL (ref 0–200)
HDL: 45.8 mg/dL (ref >39.00)
LDL Cholesterol: 96 mg/dL (ref 0–99)
NonHDL: 118.64
Total CHOL/HDL Ratio: 4
Triglycerides: 115 mg/dL (ref 0.0–149.0)
VLDL: 23 mg/dL (ref 0.0–40.0)

## 2019-02-03 LAB — HEPATIC FUNCTION PANEL
ALT: 15 U/L (ref 0–53)
AST: 44 U/L — ABNORMAL HIGH (ref 0–37)
Albumin: 4.2 g/dL (ref 3.5–5.2)
Alkaline Phosphatase: 67 U/L (ref 39–117)
Bilirubin, Direct: 0.2 mg/dL (ref 0.0–0.3)
Total Bilirubin: 0.9 mg/dL (ref 0.2–1.2)
Total Protein: 6.9 g/dL (ref 6.0–8.3)

## 2019-02-03 LAB — BASIC METABOLIC PANEL
BUN: 15 mg/dL (ref 6–23)
CO2: 33 mEq/L — ABNORMAL HIGH (ref 19–32)
Calcium: 9.6 mg/dL (ref 8.4–10.5)
Chloride: 100 mEq/L (ref 96–112)
Creatinine, Ser: 0.98 mg/dL (ref 0.40–1.50)
GFR: 75.17 mL/min (ref >60.00)
Glucose, Bld: 80 mg/dL (ref 70–99)
Potassium: 4 mEq/L (ref 3.5–5.1)
Sodium: 138 mEq/L (ref 135–145)

## 2019-02-04 ENCOUNTER — Encounter: Payer: Self-pay | Admitting: Internal Medicine

## 2019-02-12 ENCOUNTER — Ambulatory Visit (INDEPENDENT_AMBULATORY_CARE_PROVIDER_SITE_OTHER): Payer: Medicare Other | Admitting: Internal Medicine

## 2019-02-12 ENCOUNTER — Other Ambulatory Visit: Payer: Self-pay

## 2019-02-12 ENCOUNTER — Ambulatory Visit: Payer: Federal, State, Local not specified - PPO

## 2019-02-12 VITALS — Ht 69.0 in | Wt 224.0 lb

## 2019-02-12 DIAGNOSIS — J4520 Mild intermittent asthma, uncomplicated: Secondary | ICD-10-CM

## 2019-02-12 DIAGNOSIS — R945 Abnormal results of liver function studies: Secondary | ICD-10-CM

## 2019-02-12 DIAGNOSIS — K219 Gastro-esophageal reflux disease without esophagitis: Secondary | ICD-10-CM | POA: Diagnosis not present

## 2019-02-12 DIAGNOSIS — Z125 Encounter for screening for malignant neoplasm of prostate: Secondary | ICD-10-CM

## 2019-02-12 DIAGNOSIS — E7800 Pure hypercholesterolemia, unspecified: Secondary | ICD-10-CM

## 2019-02-12 DIAGNOSIS — R7989 Other specified abnormal findings of blood chemistry: Secondary | ICD-10-CM

## 2019-02-12 DIAGNOSIS — G2581 Restless legs syndrome: Secondary | ICD-10-CM

## 2019-02-12 DIAGNOSIS — G4730 Sleep apnea, unspecified: Secondary | ICD-10-CM | POA: Diagnosis not present

## 2019-02-12 NOTE — Progress Notes (Signed)
Patient ID: Joseph Good., male   DOB: [birth year], 73 y.o.   MRN: [id number]   Virtual Visit via telephone Note  This visit type was conducted due to national recommendations for restrictions regarding the COVID-19 pandemic (e.g. social distancing).  This format is felt to be most appropriate for this patient at this time.  All issues noted in this document were discussed and addressed.  No physical exam was performed (except for noted visual exam findings with Video Visits).   I connected with York Ram today telephone and verified that I am speaking with the correct person using two identifiers. Location patient: home Location provider: work Persons participating in the telephone visit: patient, provider  I discussed the limitations, risks, security and privacy concerns of performing an evaluation and management service by telephone and the availability of in person appointments.  The patient expressed understanding and agreed to proceed.   Reason for visit: scheduled follow up.   HPI: He reports he is doing well.  Staying in due to covid restrictions.  Breathing stable.  No chest pain.  Trying to stay active.  Trying to watch his diet.  No sob.  No acid reflux.  No abdominal pain.  Bowels moving.  Overall feels good.  mirapex .5mg  working well.  Walking one mile per day.  Weight is down.     ROS: See pertinent positives and negatives per HPI.  Past Medical History:  Diagnosis Date  . Asthma   . Bladder outlet obstruction   . Crohn's disease ([place])    appendix, s/p appendectomy  . GERD (gastroesophageal reflux disease)    schatzki ring  . Hypercholesterolemia   . Restless leg syndrome   . Seizure disorder [facility])    age 73, previously on phenobarbital  . Skin cancer, basal cell   . Sleep apnea     Past Surgical History:  Procedure Laterality Date  . APPENDECTOMY  1992   lysis of adhesions (bowel blockage)  . inguinal hernia surgery  1999   left  . KNEE ARTHROSCOPY   1982  . [place]  . TONSILLECTOMY AND ADENOIDECTOMY  1956    Family History  Problem Relation Age of Onset  . Heart disease Father   . Congestive Heart Failure Mother   . Colon cancer Neg Hx   . Prostate cancer Neg Hx     SOCIAL HX: reviewed.    Current Outpatient Medications:  .  albuterol (PROVENTIL HFA;VENTOLIN HFA) 108 (90 Base) MCG/ACT inhaler, Inhale 2 puffs into the lungs every 6 (six) hours as needed for wheezing or shortness of breath., Disp: 1 Inhaler, Rfl: 2 .  CALCIUM CITRATE PO, Take 1 tablet by mouth daily., Disp: , Rfl:  .  Cholecalciferol (VITAMIN D-3) 1000 UNITS CAPS, Take 1 capsule by mouth daily., Disp: , Rfl:  .  finasteride (PROSCAR) 5 MG tablet, TAKE 1 TABLET DAILY, Disp: 90 tablet, Rfl: 1 .  fluticasone furoate-vilanterol (BREO ELLIPTA) 100-25 MCG/INH AEPB, Inhale 1 puff into the lungs daily., Disp: 3 each, Rfl: 3 .  Magnesium 250 MG TABS, Two tablets q day, Disp: , Rfl:  .  omeprazole (PRILOSEC) 20 MG capsule, TAKE 1 CAPSULE (20 MG TOTAL) BY MOUTH 2 (TWO) TIMES DAILY BEFORE A MEAL., Disp: 180 capsule, Rfl: 1 .  pramipexole (MIRAPEX) 0.5 MG tablet, Take 1 tablet (0.5 mg total) by mouth at bedtime., Disp: 90 tablet, Rfl: 1 .  pravastatin (PRAVACHOL) 20 MG tablet, Take 1 tablet (20 mg total)  by mouth daily., Disp: 90 tablet, Rfl: 1  EXAM:  VITALS per patient if applicable:  [id number].    GENERAL: alert.  Sounds to be in no acute distress.  Answering questions appropriately.    PSYCH/NEURO: pleasant and cooperative, no obvious depression or anxiety, speech and thought processing grossly intact  ASSESSMENT AND PLAN:  Discussed the following assessment and plan:  Abnormal liver function test Has been stable.  Diet and exercise.  Follow liver panel.    Asthma Breathing stable.   GERD (gastroesophageal reflux disease) Controlled on current regimen.    Hypercholesterolemia On pravastatin.  Low cholesterol diet and exercise.  Follow lipid  panel and liver function tests.    Restless leg syndrome Doing well on current dose of mirapex.  Follow.    Sleep apnea Using cpap regularly.  Doing well.      I discussed the assessment and treatment plan with the patient. The patient was provided an opportunity to ask questions and all were answered. The patient agreed with the plan and demonstrated an understanding of the instructions.   The patient was advised to call back or seek an in-person evaluation if the symptoms worsen or if the condition fails to improve as anticipated.  I provided 14 minutes of non-face-to-face time during this encounter.   Einar Pheasant, MD

## 2019-02-18 ENCOUNTER — Encounter: Payer: Self-pay | Admitting: Internal Medicine

## 2019-02-18 NOTE — Assessment & Plan Note (Signed)
Controlled on current regimen.   

## 2019-02-18 NOTE — Assessment & Plan Note (Signed)
Using cpap regularly.  Doing well.  

## 2019-02-18 NOTE — Assessment & Plan Note (Signed)
On pravastatin.  Low cholesterol diet and exercise.  Follow lipid panel and liver function tests.   

## 2019-02-18 NOTE — Assessment & Plan Note (Signed)
Doing well on current dose of mirapex.  Follow.

## 2019-02-18 NOTE — Assessment & Plan Note (Signed)
Has been stable.  Diet and exercise.  Follow liver panel.

## 2019-02-18 NOTE — Assessment & Plan Note (Signed)
Breathing stable.

## 2019-02-23 ENCOUNTER — Other Ambulatory Visit: Payer: Self-pay

## 2019-02-23 ENCOUNTER — Encounter: Payer: Self-pay | Admitting: Internal Medicine

## 2019-02-23 MED ORDER — FINASTERIDE 5 MG PO TABS: 5.0000 mg | ORAL_TABLET | Freq: Every day | ORAL | 1 refills | Status: DC

## 2019-03-10 DIAGNOSIS — L578 Other skin changes due to chronic exposure to nonionizing radiation: Secondary | ICD-10-CM | POA: Diagnosis not present

## 2019-03-10 DIAGNOSIS — L570 Actinic keratosis: Secondary | ICD-10-CM | POA: Diagnosis not present

## 2019-03-10 DIAGNOSIS — Z859 Personal history of malignant neoplasm, unspecified: Secondary | ICD-10-CM | POA: Diagnosis not present

## 2019-03-13 ENCOUNTER — Other Ambulatory Visit: Payer: Self-pay

## 2019-03-13 ENCOUNTER — Encounter: Payer: Self-pay | Admitting: Internal Medicine

## 2019-03-17 MED ORDER — BREO ELLIPTA 100-25 MCG/INH IN AEPB: 1.0000 | INHALATION_SPRAY | Freq: Every day | RESPIRATORY_TRACT | 3 refills | Status: DC

## 2019-03-17 NOTE — Telephone Encounter (Signed)
rx sent in for breo

## 2019-03-17 NOTE — Telephone Encounter (Signed)
See message below °

## 2019-03-17 NOTE — Telephone Encounter (Signed)
Are you ok with filling this?

## 2019-03-19 ENCOUNTER — Encounter: Payer: Self-pay | Admitting: Internal Medicine

## 2019-03-30 ENCOUNTER — Other Ambulatory Visit: Payer: Self-pay | Admitting: Internal Medicine

## 2019-04-03 ENCOUNTER — Encounter: Payer: Self-pay | Admitting: Internal Medicine

## 2019-05-24 ENCOUNTER — Encounter: Payer: Self-pay | Admitting: Internal Medicine

## 2019-05-25 MED ORDER — FINASTERIDE 5 MG PO TABS: 5.0000 mg | ORAL_TABLET | Freq: Every day | ORAL | 1 refills | Status: DC

## 2019-06-09 ENCOUNTER — Encounter: Payer: Self-pay | Admitting: Internal Medicine

## 2019-06-10 NOTE — Telephone Encounter (Signed)
Pt wanted to wait till April for appt and labs

## 2019-06-30 ENCOUNTER — Other Ambulatory Visit: Payer: Self-pay

## 2019-06-30 ENCOUNTER — Ambulatory Visit (INDEPENDENT_AMBULATORY_CARE_PROVIDER_SITE_OTHER): Payer: Medicare Other

## 2019-06-30 VITALS — BP 143/77 | HR 57 | Ht 69.0 in | Wt 224.0 lb

## 2019-06-30 DIAGNOSIS — E559 Vitamin D deficiency, unspecified: Secondary | ICD-10-CM | POA: Diagnosis not present

## 2019-06-30 DIAGNOSIS — Z0000 Encounter for general adult medical examination without abnormal findings: Secondary | ICD-10-CM

## 2019-06-30 NOTE — Patient Instructions (Addendum)
  Joseph Good , Thank you for taking time to come for your Medicare Wellness Visit. I appreciate your ongoing commitment to your health goals. Please review the following plan we discussed and let me know if I can assist you in the future.   These are the goals we discussed: Goals    . Follow up with Primary Care Provider     As needed       This is a list of the screening recommended for you and due dates:  Health Maintenance  Topic Date Due  . Colon Cancer Screening  12/29/2020  . Tetanus Vaccine  12/29/2025  . Flu Shot  Completed  .  Hepatitis C: One time screening is recommended by Center for Disease Control  (CDC) for  adults born from [birth year] through 1964.   Completed  . Pneumonia vaccines  Completed

## 2019-06-30 NOTE — Progress Notes (Addendum)
Subjective:   Joseph Good. is a 74 y.o. male who presents for Medicare Annual/Subsequent preventive examination.  Review of Systems:  No ROS.  Medicare Wellness Virtual Visit.  Visual/audio telehealth visit. Vitals provided by patient.  See social history for additional risk factors.   Cardiac Risk Factors include: advanced age (>57men, >29 women);male gender     Objective:    Vitals: BP (!) 143/77 (BP Location: Left Arm, Patient Position: Sitting, Cuff Size: Normal)   Pulse (!) 57   Ht 5\' 9"  (1.753 m)   Wt 224 lb (101.6 kg)   BMI 33.08 kg/m   Body mass index is 33.08 kg/m.  Advanced Directives 06/30/2019 10/07/2017 11/17/2016 09/13/2016 09/14/2015 07/31/2015  Does Patient Have a Medical Advance Directive? No No No No No No  Would patient like information on creating a medical advance directive? No - Patient declined Yes (MAU/Ambulatory/Procedural Areas - Information given) No - Patient declined No - Patient declined No - patient declined information No - patient declined information    Tobacco Social History   Tobacco Use  Smoking Status Never Smoker  Smokeless Tobacco Never Used     Counseling given: Not Answered   Clinical Intake:  Pre-visit preparation completed: Yes        Diabetes: No  How often do you need to have someone help you when you read instructions, pamphlets, or other written materials from your doctor or pharmacy?: 1 - Never  Interpreter Needed?: No     Past Medical History:  Diagnosis Date  . Asthma   . Bladder outlet obstruction   . Crohn's disease ([place])    appendix, s/p appendectomy  . GERD (gastroesophageal reflux disease)    schatzki ring  . Hypercholesterolemia   . Restless leg syndrome   . Seizure disorder [facility])    age 33, previously on phenobarbital  . Skin cancer, basal cell   . Sleep apnea    Past Surgical History:  Procedure Laterality Date  . APPENDECTOMY  1992   lysis of adhesions (bowel blockage)  . inguinal  hernia surgery  1999   left  . KNEE ARTHROSCOPY  1982  . [place]  . TONSILLECTOMY AND ADENOIDECTOMY  1956   Family History  Problem Relation Age of Onset  . Heart disease Father   . Congestive Heart Failure Mother   . Colon cancer Neg Hx   . Prostate cancer Neg Hx    Social History   Socioeconomic History  . Marital status: Married    Spouse name: Not on file  . Number of children: 0  . Years of education: Not on file  . Highest education level: Not on file  Occupational History  . Not on file  Tobacco Use  . Smoking status: Never Smoker  . Smokeless tobacco: Never Used  Substance and Sexual Activity  . Alcohol use: No    Alcohol/week: 0.0 standard drinks  . Drug use: No  . Sexual activity: Not Currently  Other Topics Concern  . Not on file  Social History Narrative  . Not on file   Social Determinants of Health   Financial Resource Strain:   . Difficulty of Paying Living Expenses: Not on file  Food Insecurity:   . Worried About Charity fundraiser in the Last Year: Not on file  . Ran Out of Food in the Last Year: Not on file  Transportation Needs:   . Lack of Transportation (Medical): Not on file  .  Lack of Transportation (Non-Medical): Not on file  Physical Activity: Sufficiently Active  . Days of Exercise per Week: 5 days  . Minutes of Exercise per Session: 30 min  Stress: No Stress Concern Present  . Feeling of Stress : Not at all  Social Connections: Unknown  . Frequency of Communication with Friends and Family: More than three times a week  . Frequency of Social Gatherings with Friends and Family: More than three times a week  . Attends Religious Services: Not on file  . Active Member of Clubs or Organizations: Not on file  . Attends Archivist Meetings: Not on file  . Marital Status: Married    Outpatient Encounter Medications as of 06/30/2019  Medication Sig  . albuterol (PROVENTIL HFA;VENTOLIN HFA) 108 (90 Base) MCG/ACT  inhaler Inhale 2 puffs into the lungs every 6 (six) hours as needed for wheezing or shortness of breath.  Marland Kitchen CALCIUM CITRATE PO Take 1 tablet by mouth daily.  . Cholecalciferol (VITAMIN D-3) 1000 UNITS CAPS Take 1 capsule by mouth daily.  . Coenzyme Q10 (COQ10) 100 MG CAPS Take 100 mg by mouth daily.  . finasteride (PROSCAR) 5 MG tablet Take 1 tablet (5 mg total) by mouth daily.  . fluticasone furoate-vilanterol (BREO ELLIPTA) 100-25 MCG/INH AEPB Inhale 1 puff into the lungs daily.  . Magnesium 250 MG TABS Two tablets q day  . Multiple Vitamins-Minerals (CENTRUM SILVER 50+MEN PO) Take 1 tablet by mouth daily.  Marland Kitchen omeprazole (PRILOSEC) 20 MG capsule TAKE 1 CAPSULE (20 MG TOTAL) BY MOUTH 2 (TWO) TIMES DAILY BEFORE A MEAL.  . pramipexole (MIRAPEX) 0.5 MG tablet Take 1 tablet (0.5 mg total) by mouth at bedtime.  . pravastatin (PRAVACHOL) 20 MG tablet TAKE 1 TABLET BY MOUTH EVERY DAY   No facility-administered encounter medications on file as of 06/30/2019.    Activities of Daily Living In your present state of health, do you have any difficulty performing the following activities: 06/30/2019  Hearing? Y  Comment Hearing aid  Vision? N  Difficulty concentrating or making decisions? N  Walking or climbing stairs? N  Dressing or bathing? N  Doing errands, shopping? N  Preparing Food and eating ? N  Using the Toilet? N  In the past six months, have you accidently leaked urine? N  Do you have problems with loss of bowel control? N  Managing your Medications? N  Managing your Finances? N  Housekeeping or managing your Housekeeping? N  Some recent data might be hidden    Patient Care Team: Einar Pheasant, MD as PCP - General (Internal Medicine)   Assessment:   This is a routine wellness examination for Joseph Good.  Nurse connected with patient 06/30/19 at  9:00 AM EST by a telephone enabled telemedicine application and verified that I am speaking with the correct person using two identifiers.  Patient stated full name and DOB. Patient gave permission to continue with virtual visit. Patient's location was at home and Nurse's location was at [place] office.   Patient is alert and oriented x3. Patient denies difficulty focusing or concentrating. Patient likes to read and play chess for brain stimulation.   Health Maintenance Due: See completed HM at the end of note.   Eye: Visual acuity not assessed. Virtual visit. Followed by their ophthalmologist.  Dental: Visits every 6 months.    Hearing: Hearing aids- yes  Safety:  Patient feels safe at home- yes Patient does have smoke detectors at home- yes Patient does wear sunscreen or  protective clothing when in direct sunlight - yes Patient does wear seat belt when in a moving vehicle - yes Patient drives- yes Adequate lighting in walkways free from debris- yes Grab bars and handrails used as appropriate- yes Ambulates with an assistive device- no  Social: Alcohol intake - no  Smoking history- never   Smokers in home? none Illicit drug use? none  Medication: Taking as directed and without issues.  Self managed - yes   Covid-19: Precautions and sickness symptoms discussed. Wears mask, social distancing, hand hygiene as appropriate.   Activities of Daily Living Patient denies needing assistance with: household chores, feeding themselves, getting from bed to chair, getting to the toilet, bathing/showering, dressing, managing money, or preparing meals.   Discussed the importance of a healthy diet, water intake and the benefits of aerobic exercise.   Physical activity- walking 1 mile daily  Diet:  Regular Water: good intake Caffeine: half/half coffee  Other Providers Patient Care Team: Einar Pheasant, MD as PCP - General (Internal Medicine)  Exercise Activities and Dietary recommendations Current Exercise Habits: Home exercise routine, Type of exercise: walking, Time (Minutes): 30, Frequency (Times/Week): 5,  Weekly Exercise (Minutes/Week): 150, Intensity: Moderate  Goals    . Follow up with Primary Care Provider     As needed       Fall Risk Fall Risk  06/30/2019 02/12/2019 11/28/2017 10/07/2017 09/13/2016  Falls in the past year? 0 0 No No No  Comment - - Emmi Telephone Survey: data to providers prior to load - -  Follow up Falls evaluation completed Falls evaluation completed - - -   Timed Get Up and Go Performed: no, virtual visit  Depression Screen PHQ 2/9 Scores 06/30/2019 02/12/2019 10/07/2017 09/13/2016  PHQ - 2 Score 0 0 0 0  PHQ- 9 Score - - - 0    Cognitive Function MMSE - Mini Mental State Exam 10/07/2017 09/13/2016 09/14/2015  Orientation to time 5 5 5   Orientation to Place 5 5 5   Registration 3 3 3   Attention/ Calculation 5 5 5   Recall 3 3 3   Language- name 2 objects 2 2 2   Language- repeat 1 1 1   Language- follow 3 step command 3 3 3   Language- read & follow direction 1 1 1   Write a sentence 1 1 1   Copy design 1 1 1   Total score 30 30 30      6CIT Screen 06/30/2019  What Year? 0 points  What month? 0 points  What time? 0 points  Count back from 20 0 points  Months in reverse 0 points    Immunization History  Administered Date(s) Administered  . Fluad Quad(high Dose 65+) 02/03/2019  . Influenza Split 03/02/2013  . Influenza, High Dose Seasonal PF 02/03/2016, 02/15/2017, 02/25/2018  . Influenza,inj,Quad PF,6+ Mos 01/22/2014, 01/31/2015  . Influenza-Unspecified 02/25/2018  . Pneumococcal Conjugate-13 07/22/2013  . Pneumococcal Polysaccharide-23 02/11/2015  . Tdap 12/30/2015   Screening Tests Health Maintenance  Topic Date Due  . COLONOSCOPY  12/29/2020  . TETANUS/TDAP  12/29/2025  . INFLUENZA VACCINE  Completed  . Hepatitis C Screening  Completed  . PNA vac Low Risk Adult  Completed       Plan:   Keep all routine maintenance appointments.   Next scheduled fasting lab 08/11/19 @ 8:00. Vit D added per patient request.   Follow up 08/13/19 @  10:00  Medicare Attestation I have personally reviewed: The patient's medical and social history Their use of alcohol, tobacco or illicit drugs  Their current medications and supplements The patient's functional ability including ADLs,fall risks, home safety risks, cognitive, and hearing and visual impairment Diet and physical activities Evidence for depression   I have reviewed and discussed with patient certain preventive protocols, quality metrics, and best practice recommendations.   Varney Biles, LPN  [id number]   Reviewed above information.  Agree with assessment and plan.    Dr Nicki Reaper

## 2019-07-10 ENCOUNTER — Other Ambulatory Visit: Payer: Self-pay | Admitting: Internal Medicine

## 2019-08-11 ENCOUNTER — Other Ambulatory Visit (INDEPENDENT_AMBULATORY_CARE_PROVIDER_SITE_OTHER): Payer: Medicare Other

## 2019-08-11 ENCOUNTER — Other Ambulatory Visit: Payer: Self-pay

## 2019-08-11 DIAGNOSIS — G2581 Restless legs syndrome: Secondary | ICD-10-CM | POA: Diagnosis not present

## 2019-08-11 DIAGNOSIS — E559 Vitamin D deficiency, unspecified: Secondary | ICD-10-CM

## 2019-08-11 DIAGNOSIS — E7800 Pure hypercholesterolemia, unspecified: Secondary | ICD-10-CM

## 2019-08-11 DIAGNOSIS — R945 Abnormal results of liver function studies: Secondary | ICD-10-CM | POA: Diagnosis not present

## 2019-08-11 DIAGNOSIS — Z125 Encounter for screening for malignant neoplasm of prostate: Secondary | ICD-10-CM

## 2019-08-11 DIAGNOSIS — R7989 Other specified abnormal findings of blood chemistry: Secondary | ICD-10-CM

## 2019-08-11 LAB — BASIC METABOLIC PANEL
BUN: 16 mg/dL (ref 6–23)
CO2: 32 mEq/L (ref 19–32)
Calcium: 9.3 mg/dL (ref 8.4–10.5)
Chloride: 102 mEq/L (ref 96–112)
Creatinine, Ser: 1.1 mg/dL (ref 0.40–1.50)
GFR: 65.7 mL/min (ref >60.00)
Glucose, Bld: 88 mg/dL (ref 70–99)
Potassium: 4 mEq/L (ref 3.5–5.1)
Sodium: 139 mEq/L (ref 135–145)

## 2019-08-11 LAB — HEPATIC FUNCTION PANEL
ALT: 15 U/L (ref 0–53)
AST: 46 U/L — ABNORMAL HIGH (ref 0–37)
Albumin: 4.1 g/dL (ref 3.5–5.2)
Alkaline Phosphatase: 63 U/L (ref 39–117)
Bilirubin, Direct: 0.2 mg/dL (ref 0.0–0.3)
Total Bilirubin: 1 mg/dL (ref 0.2–1.2)
Total Protein: 6.6 g/dL (ref 6.0–8.3)

## 2019-08-11 LAB — LIPID PANEL
Cholesterol: 164 mg/dL (ref 0–200)
HDL: 41.4 mg/dL (ref >39.00)
LDL Cholesterol: 102 mg/dL — ABNORMAL HIGH (ref 0–99)
NonHDL: 122.55
Total CHOL/HDL Ratio: 4
Triglycerides: 103 mg/dL (ref 0.0–149.0)
VLDL: 20.6 mg/dL (ref 0.0–40.0)

## 2019-08-11 LAB — CBC WITH DIFFERENTIAL/PLATELET
Basophils Absolute: 0 10*3/uL (ref 0.0–0.1)
Basophils Relative: 0.6 % (ref 0.0–3.0)
Eosinophils Absolute: 0.2 10*3/uL (ref 0.0–0.7)
Eosinophils Relative: 3 % (ref 0.0–5.0)
HCT: 44.8 % (ref 39.0–52.0)
Hemoglobin: 15.3 g/dL (ref 13.0–17.0)
Lymphocytes Relative: 22.7 % (ref 12.0–46.0)
Lymphs Abs: 1.3 10*3/uL (ref 0.7–4.0)
MCHC: 34.2 g/dL (ref 30.0–36.0)
MCV: 95 fl (ref 78.0–100.0)
Monocytes Absolute: 0.7 10*3/uL (ref 0.1–1.0)
Monocytes Relative: 11.4 % (ref 3.0–12.0)
Neutro Abs: 3.6 10*3/uL (ref 1.4–7.7)
Neutrophils Relative %: 62.3 % (ref 43.0–77.0)
Platelets: 222 10*3/uL (ref 150.0–400.0)
RBC: 4.72 Mil/uL (ref 4.22–5.81)
RDW: 13.2 % (ref 11.5–15.5)
WBC: 5.8 10*3/uL (ref 4.0–10.5)

## 2019-08-11 LAB — PSA, MEDICARE: PSA: 1.21 ng/ml (ref 0.10–4.00)

## 2019-08-11 LAB — TSH: TSH: 2.26 u[IU]/mL (ref 0.35–4.50)

## 2019-08-11 LAB — VITAMIN D 25 HYDROXY (VIT D DEFICIENCY, FRACTURES): VITD: 42.88 ng/mL (ref 30.00–100.00)

## 2019-08-13 ENCOUNTER — Telehealth: Payer: Self-pay | Admitting: Internal Medicine

## 2019-08-13 ENCOUNTER — Telehealth (INDEPENDENT_AMBULATORY_CARE_PROVIDER_SITE_OTHER): Payer: Medicare Other | Admitting: Internal Medicine

## 2019-08-13 ENCOUNTER — Encounter: Payer: Self-pay | Admitting: Internal Medicine

## 2019-08-13 ENCOUNTER — Other Ambulatory Visit: Payer: Self-pay

## 2019-08-13 DIAGNOSIS — G2581 Restless legs syndrome: Secondary | ICD-10-CM | POA: Diagnosis not present

## 2019-08-13 DIAGNOSIS — J4520 Mild intermittent asthma, uncomplicated: Secondary | ICD-10-CM

## 2019-08-13 DIAGNOSIS — K219 Gastro-esophageal reflux disease without esophagitis: Secondary | ICD-10-CM | POA: Diagnosis not present

## 2019-08-13 DIAGNOSIS — R945 Abnormal results of liver function studies: Secondary | ICD-10-CM | POA: Diagnosis not present

## 2019-08-13 DIAGNOSIS — R7989 Other specified abnormal findings of blood chemistry: Secondary | ICD-10-CM

## 2019-08-13 DIAGNOSIS — G4730 Sleep apnea, unspecified: Secondary | ICD-10-CM | POA: Diagnosis not present

## 2019-08-13 DIAGNOSIS — E7800 Pure hypercholesterolemia, unspecified: Secondary | ICD-10-CM | POA: Diagnosis not present

## 2019-08-13 NOTE — Telephone Encounter (Signed)
LMTCB and scheduled a fasting lab and cpe in 73m

## 2019-08-13 NOTE — Progress Notes (Signed)
Patient ID: Joseph Good., male   DOB: 09-11-1945, 74 y.o.   MRN: [id number]   Virtual Visit via telephone Note  This visit type was conducted due to national recommendations for restrictions regarding the COVID-19 pandemic (e.g. social distancing).  This format is felt to be most appropriate for this patient at this time.  All issues noted in this document were discussed and addressed.  No physical exam was performed (except for noted visual exam findings with Video Visits).   I connected with Katrine Coho by telephone and verified that I am speaking with the correct person using two identifiers. Location patient: home Location provider: work  Persons participating in the telephone visit: patient, provider  The limitations, risks, security and privacy concerns of performing an evaluation and management service by telephone and the availability of in person appointments have been discussed. The patient expressed understanding and agreed to proceed.   Reason for visit: scheduled follow up.   HPI: Scheduled follow up for hypercholesterolemia, sleep apnea, etc.  States he is doing well.  Feels good.  No chest pain or sob with increased activity - none reported.  Using [place] daily.  Breathing stable.  No increased cough or congestion.  No acid reflux.  No abdominal pain.  Bowels moving.  Using cpap regularly.  Restless legs - not a problem.  Discussed labs.  Discussed increasing pravastatin.  Had cramping with previous statin.  Elected to hold on increasing dose of pravastatin.  Did receive covid vaccine Wynetta Emery and [place]).  Staying in due to covid restrictions.    ROS: See pertinent positives and negatives per HPI.  Past Medical History:  Diagnosis Date  . Asthma   . Bladder outlet obstruction   . Crohn's disease ([place])    appendix, s/p appendectomy  . GERD (gastroesophageal reflux disease)    schatzki ring  . Hypercholesterolemia   . Restless leg syndrome   . Seizure disorder [facility])     age 33, previously on phenobarbital  . Skin cancer, basal cell   . Sleep apnea     Past Surgical History:  Procedure Laterality Date  . APPENDECTOMY  1992   lysis of adhesions (bowel blockage)  . inguinal hernia surgery  1999   left  . KNEE ARTHROSCOPY  1982  . [place]  . TONSILLECTOMY AND ADENOIDECTOMY  1956    Family History  Problem Relation Age of Onset  . Heart disease Father   . Congestive Heart Failure Mother   . Colon cancer Neg Hx   . Prostate cancer Neg Hx     SOCIAL HX: reviewed.    Current Outpatient Medications:  .  albuterol (PROVENTIL HFA;VENTOLIN HFA) 108 (90 Base) MCG/ACT inhaler, Inhale 2 puffs into the lungs every 6 (six) hours as needed for wheezing or shortness of breath., Disp: 1 Inhaler, Rfl: 2 .  CALCIUM CITRATE PO, Take 1 tablet by mouth daily., Disp: , Rfl:  .  Cholecalciferol (VITAMIN D-3) 1000 UNITS CAPS, Take 1 capsule by mouth daily., Disp: , Rfl:  .  Coenzyme Q10 (COQ10) 100 MG CAPS, Take 100 mg by mouth daily., Disp: , Rfl:  .  finasteride (PROSCAR) 5 MG tablet, Take 1 tablet (5 mg total) by mouth daily., Disp: 90 tablet, Rfl: 1 .  fluticasone furoate-vilanterol (BREO ELLIPTA) 100-25 MCG/INH AEPB, Inhale 1 puff into the lungs daily., Disp: 90 each, Rfl: 3 .  Magnesium 250 MG TABS, Two tablets q day, Disp: , Rfl:  .  Multiple Vitamins-Minerals (CENTRUM SILVER 50+MEN PO), Take 1 tablet by mouth daily., Disp: , Rfl:  .  omeprazole (PRILOSEC) 20 MG capsule, TAKE 1 CAPSULE (20 MG TOTAL) BY MOUTH 2 (TWO) TIMES DAILY BEFORE A MEAL., Disp: 180 capsule, Rfl: 1 .  pramipexole (MIRAPEX) 0.5 MG tablet, TAKE 1 TABLET (0.5 MG TOTAL) BY MOUTH AT BEDTIME., Disp: 90 tablet, Rfl: 1 .  pravastatin (PRAVACHOL) 20 MG tablet, TAKE 1 TABLET BY MOUTH EVERY DAY, Disp: 90 tablet, Rfl: 1  EXAM:  GENERAL: alert.  Sounds to be in no acute distress.  Answering questions appropriately.    PSYCH/NEURO: pleasant and cooperative, no obvious depression or  anxiety, speech and thought processing grossly intact  ASSESSMENT AND PLAN:  Discussed the following assessment and plan:  Abnormal liver function test Has been stable.  Continue diet and exercise.  Follow liver panel.   Asthma On breo.  Breathing stable.  Rarely uses albuterol.    GERD (gastroesophageal reflux disease) Controlled on omeprazole.    Hypercholesterolemia On pravastatin. Discussed labs.  Discussed increasing pravastatin.  Had problems with previous statin.  Hold on increasing dose.  Low cholesterol diet and exercise.  Follow lipid panel.   Restless leg syndrome Doing well.  On mirapex.  Not an issue for him now.   Sleep apnea Using cpap regularly.  Sleeping well.  Follow.     Orders Placed This Encounter  Procedures  . Hepatic function panel    Standing Status:   Future    Standing Expiration Date:   08/16/2020  . Lipid panel    Standing Status:   Future    Standing Expiration Date:   08/16/2020  . Basic metabolic panel    Standing Status:   Future    Standing Expiration Date:   08/16/2020     I discussed the assessment and treatment plan with the patient. The patient was provided an opportunity to ask questions and all were answered. The patient agreed with the plan and demonstrated an understanding of the instructions.   The patient was advised to call back or seek an in-person evaluation if the symptoms worsen or if the condition fails to improve as anticipated.  I provided 21 minutes of non-face-to-face time during this encounter.   Einar Pheasant, MD

## 2019-08-17 ENCOUNTER — Encounter: Payer: Self-pay | Admitting: Internal Medicine

## 2019-08-17 ENCOUNTER — Telehealth: Payer: Self-pay | Admitting: Internal Medicine

## 2019-08-17 NOTE — Assessment & Plan Note (Signed)
Controlled on omeprazole.   

## 2019-08-17 NOTE — Telephone Encounter (Signed)
LMTCB

## 2019-08-17 NOTE — Telephone Encounter (Signed)
01/26/2011- repeat in 5 years. Dr Comer Locket in [place]. I have requested copy of his last one.

## 2019-08-17 NOTE — Assessment & Plan Note (Signed)
Using cpap regularly.  Sleeping well.  Follow.

## 2019-08-17 NOTE — Assessment & Plan Note (Signed)
Doing well.  On mirapex.  Not an issue for him now.

## 2019-08-17 NOTE — Assessment & Plan Note (Addendum)
On breo.  Breathing stable.  Rarely uses albuterol.

## 2019-08-17 NOTE — Telephone Encounter (Signed)
If last was 2012, then he is overdue.  Please see if pt agreeable to f/u colonoscopy.

## 2019-08-17 NOTE — Assessment & Plan Note (Signed)
Has been stable.  Continue diet and exercise.  Follow liver panel.

## 2019-08-17 NOTE — Assessment & Plan Note (Signed)
On pravastatin. Discussed labs.  Discussed increasing pravastatin.  Had problems with previous statin.  Hold on increasing dose.  Low cholesterol diet and exercise.  Follow lipid panel.

## 2019-08-17 NOTE — Telephone Encounter (Signed)
Please contact pt and see where and when had last colonoscopy.  Need report and will need to know when due f/u.  Thanks.

## 2019-08-18 NOTE — Telephone Encounter (Signed)
Mychart sent to pt.

## 2019-09-14 ENCOUNTER — Other Ambulatory Visit: Payer: Self-pay | Admitting: Internal Medicine

## 2019-09-24 DIAGNOSIS — L821 Other seborrheic keratosis: Secondary | ICD-10-CM | POA: Diagnosis not present

## 2019-09-24 DIAGNOSIS — L570 Actinic keratosis: Secondary | ICD-10-CM | POA: Diagnosis not present

## 2019-09-25 ENCOUNTER — Encounter: Payer: Self-pay | Admitting: Internal Medicine

## 2019-09-25 DIAGNOSIS — R319 Hematuria, unspecified: Secondary | ICD-10-CM

## 2019-09-25 NOTE — Telephone Encounter (Signed)
LMTCB

## 2019-09-25 NOTE — Telephone Encounter (Signed)
I can place the order for the referral, but it sounds like he is having acute problems.  Is she still having burning, blood, etc?

## 2019-09-30 NOTE — Telephone Encounter (Signed)
Pt's wife Jamas Lav returned your call [phone]. Pt still in pain

## 2019-09-30 NOTE — Telephone Encounter (Signed)
Disregard message below. Pt confirmed not having any acute issues. He would just like referral to urology- Dr Erlene Quan

## 2019-10-01 NOTE — Telephone Encounter (Signed)
Order placed for urology referral.  

## 2019-10-09 DIAGNOSIS — M25561 Pain in right knee: Secondary | ICD-10-CM | POA: Diagnosis not present

## 2019-10-09 DIAGNOSIS — M21161 Varus deformity, not elsewhere classified, right knee: Secondary | ICD-10-CM | POA: Diagnosis not present

## 2019-10-09 DIAGNOSIS — M1711 Unilateral primary osteoarthritis, right knee: Secondary | ICD-10-CM | POA: Diagnosis not present

## 2019-10-21 ENCOUNTER — Ambulatory Visit: Payer: Self-pay | Admitting: Urology

## 2019-11-03 ENCOUNTER — Encounter: Payer: Self-pay | Admitting: Internal Medicine

## 2019-11-04 ENCOUNTER — Other Ambulatory Visit: Payer: Self-pay

## 2019-11-04 MED ORDER — PRAVASTATIN SODIUM 20 MG PO TABS: 20.0000 mg | ORAL_TABLET | Freq: Every day | ORAL | 0 refills | Status: DC

## 2019-11-09 NOTE — Progress Notes (Addendum)
11/10/2019 9:01 PM   Joseph Good February 26, 1946 [id number]  Referring provider: Einar Pheasant, [address] [place],  [place] [address] Chief Complaint  Patient presents with  . Hematuria    New Patient    HPI: Joseph Good. is a 74 y.o. male who is seen for an evaluation and management of hematuria.  He was last seen by Dr. Jacqlyn Larsen at [facility] urology in 2018. His PVR was 16 mL.  He has a personal history of elevated PSA, incomplete bladder emptying, and benign localized prostatic hyperplasia with LUTS.  Chronically on finasteride.  Most recent PSA was 1.21 on 08/11/2019.   Patient contacted his PCP on 09/25/2019 regarding hematuria, pain and burning sensation during urination.    About 7 weeks about he had burning with urination at the tip of his penis. When he wiped there was some blood on the tissue.   It was noted mixed in with his urine (denies pink or blood tinged urine).  This occurred x 1 without recurrence.    He has never been circumcised. He denies have any current irritation or raw spots.   He is emptying his bladder well. He has had a weak stream for several years. He is on finasteride.   He has no urgency or frequency.   Never smoked. No personal history of kidney stones.   PMH: Past Medical History:  Diagnosis Date  . Asthma   . Bladder outlet obstruction   . Crohn's disease ([place])    appendix, s/p appendectomy  . GERD (gastroesophageal reflux disease)    schatzki ring  . Hypercholesterolemia   . Restless leg syndrome   . Seizure disorder [facility])    age 6, previously on phenobarbital  . Skin cancer, basal cell   . Sleep apnea     Surgical History: Past Surgical History:  Procedure Laterality Date  . APPENDECTOMY  1992   lysis of adhesions (bowel blockage)  . inguinal hernia surgery  1999   left  . KNEE ARTHROSCOPY  1982  . [place]  . TONSILLECTOMY AND ADENOIDECTOMY  1956    Home Medications:   Allergies as of 11/10/2019      Reactions   Seldane [terfenadine]       Medication List       Accurate as of November 10, 2019  9:01 PM. If you have any questions, ask your nurse or doctor.        albuterol 108 (90 Base) MCG/ACT inhaler Commonly known as: VENTOLIN HFA Inhale 2 puffs into the lungs every 6 (six) hours as needed for wheezing or shortness of breath.   Breo Ellipta 100-25 MCG/INH Aepb Generic drug: fluticasone furoate-vilanterol Inhale 1 puff into the lungs daily.   CALCIUM CITRATE PO Take 1 tablet by mouth daily.   CENTRUM SILVER 50+MEN PO Take 1 tablet by mouth daily.   CoQ10 100 MG Caps Take 100 mg by mouth daily.   finasteride 5 MG tablet Commonly known as: PROSCAR TAKE 1 TABLET BY MOUTH EVERY DAY   Magnesium 250 MG Tabs Two tablets q day   magnesium oxide 400 MG tablet Commonly known as: MAG-OX Take 1 tablet by mouth daily.   omeprazole 20 MG capsule Commonly known as: PRILOSEC TAKE 1 CAPSULE (20 MG TOTAL) BY MOUTH 2 (TWO) TIMES DAILY BEFORE A MEAL.   pramipexole 0.5 MG tablet Commonly known as: MIRAPEX TAKE 1 TABLET (0.5 MG TOTAL) BY MOUTH AT BEDTIME.  pravastatin 20 MG tablet Commonly known as: PRAVACHOL Take 1 tablet (20 mg total) by mouth daily.   Vitamin D-3 25 MCG (1000 UT) Caps Take 1 capsule by mouth daily.       Allergies:  Allergies  Allergen Reactions  . Seldane [Terfenadine]     Family History: Family History  Problem Relation Age of Onset  . Heart disease Father   . Congestive Heart Failure Mother   . Colon cancer Neg Hx   . Prostate cancer Neg Hx     Social History:  reports that he has never smoked. He has never used smokeless tobacco. He reports that he does not drink alcohol and does not use drugs.   Physical Exam: BP 130/70   Ht 5\' 9"  (1.753 m)   Wt 220 lb (99.8 kg)   BMI 32.49 kg/m   Constitutional:  Alert and oriented, No acute distress.  Accompanied by wife today. HEENT: [place] AT, moist mucus  membranes.  Trachea midline, no masses. Cardiovascular: No clubbing, cyanosis, or edema. Respiratory: Normal respiratory effort, no increased work of breathing. GU: No CVA tenderness. Uncircumcised. Incidental nevus (without pathology) noted under shaft of foreskin. Tiny shallow ulcer on penis head at 12 o clock. Skin: No rashes, bruises or suspicious lesions. Neurologic: Grossly intact, no focal deficits, moving all 4 extremities. Psychiatric: Normal mood and affect.  Laboratory Data:  Lab Results  Component Value Date   CREATININE 1.10 08/11/2019    Urinalysis Negative  Results for orders placed or performed in visit on 11/10/19  Microscopic Examination   Urine  Result Value Ref Range   WBC, UA 0-5 0 - 5 /hpf   RBC None seen 0 - 2 /hpf   Epithelial Cells (non renal) 0-10 0 - 10 /hpf   Casts Present (A) None seen /lpf   Cast Type Hyaline casts N/A   Bacteria, UA None seen None seen/Few  Urinalysis, Complete  Result Value Ref Range   Specific Gravity, UA 1.020 1.005 - 1.030   pH, UA 8.5 (H) 5.0 - 7.5   Color, UA Yellow Yellow   Appearance Ur Clear Clear   Leukocytes,UA Negative Negative   Protein,UA Negative Negative/Trace   Glucose, UA Negative Negative   Ketones, UA Negative Negative   RBC, UA Negative Negative   Bilirubin, UA Negative Negative   Urobilinogen, Ur 0.2 0.2 - 1.0 mg/dL   Nitrite, UA Negative Negative   Microscopic Examination See below:      Assessment & Plan:    1. Hematuria/ dysuria  Isolated episode suspected  from external penile irritation based on nature and exam Asymptomatic currently UA was negative. Recheck with PCP with next routine labs for reassurance. Defer hematuria work up at this time given above Physical exam showed easily retractable foreskin, 12 o'clock position there was splitting and irritation, a tiny ulcer on penis head, which could have contributed to hematuria.  Recommended some antifungal/ steroid cream.   2. History  elevated PSA Most recent PSA was 1.21 on 08/11/2019.  Continue yearly follow-up.  3. Benign localized prostatic hyperplasia with LUTS Voiding well. Weak stream for several years. Continue chronic finasteride   Follow up in 1 year, desire to have annual urology f/u   [facility] [address] [place], [place] [address] [phone]  I, Selena Batten, am acting as a scribe for Dr. Hollice Espy.  I have reviewed the above documentation for accuracy and completeness, and I agree with the above.  Hollice Espy, MD  I spent 45 total minutes on the day of the encounter including pre-visit review of the medical record, face-to-face time with the patient, and post visit ordering of labs/imaging/tests.

## 2019-11-10 ENCOUNTER — Encounter: Payer: Self-pay | Admitting: Internal Medicine

## 2019-11-10 ENCOUNTER — Encounter: Payer: Self-pay | Admitting: Urology

## 2019-11-10 ENCOUNTER — Other Ambulatory Visit: Payer: Self-pay

## 2019-11-10 ENCOUNTER — Ambulatory Visit (INDEPENDENT_AMBULATORY_CARE_PROVIDER_SITE_OTHER): Payer: Medicare Other | Admitting: Urology

## 2019-11-10 VITALS — BP 130/70 | Ht 69.0 in | Wt 220.0 lb

## 2019-11-10 DIAGNOSIS — N400 Enlarged prostate without lower urinary tract symptoms: Secondary | ICD-10-CM

## 2019-11-10 DIAGNOSIS — N485 Ulcer of penis: Secondary | ICD-10-CM

## 2019-11-10 DIAGNOSIS — R310 Gross hematuria: Secondary | ICD-10-CM

## 2019-11-10 DIAGNOSIS — R3129 Other microscopic hematuria: Secondary | ICD-10-CM | POA: Diagnosis not present

## 2019-11-10 DIAGNOSIS — Z87898 Personal history of other specified conditions: Secondary | ICD-10-CM

## 2019-11-10 DIAGNOSIS — R319 Hematuria, unspecified: Secondary | ICD-10-CM | POA: Insufficient documentation

## 2019-11-10 LAB — URINALYSIS, COMPLETE
Bilirubin, UA: NEGATIVE
Glucose, UA: NEGATIVE
Ketones, UA: NEGATIVE
Leukocytes,UA: NEGATIVE
Nitrite, UA: NEGATIVE
Protein,UA: NEGATIVE
RBC, UA: NEGATIVE
Specific Gravity, UA: 1.02 (ref 1.005–1.030)
Urobilinogen, Ur: 0.2 mg/dL (ref 0.2–1.0)
pH, UA: 8.5 — ABNORMAL HIGH (ref 5.0–7.5)

## 2019-11-10 LAB — MICROSCOPIC EXAMINATION
Bacteria, UA: NONE SEEN
RBC, Urine: NONE SEEN /hpf (ref 0–2)

## 2019-12-16 ENCOUNTER — Other Ambulatory Visit: Payer: Medicare Other

## 2019-12-24 ENCOUNTER — Other Ambulatory Visit: Payer: Medicare Other

## 2020-01-02 ENCOUNTER — Other Ambulatory Visit: Payer: Self-pay | Admitting: Internal Medicine

## 2020-01-03 ENCOUNTER — Other Ambulatory Visit: Payer: Self-pay | Admitting: Internal Medicine

## 2020-01-18 ENCOUNTER — Encounter: Payer: Self-pay | Admitting: Internal Medicine

## 2020-01-19 ENCOUNTER — Other Ambulatory Visit: Payer: Self-pay

## 2020-01-19 MED ORDER — PRAVASTATIN SODIUM 20 MG PO TABS: 20.0000 mg | ORAL_TABLET | Freq: Every day | ORAL | 1 refills | Status: DC

## 2020-02-22 ENCOUNTER — Other Ambulatory Visit (INDEPENDENT_AMBULATORY_CARE_PROVIDER_SITE_OTHER): Payer: Medicare Other

## 2020-02-22 ENCOUNTER — Other Ambulatory Visit: Payer: Self-pay

## 2020-02-22 DIAGNOSIS — R945 Abnormal results of liver function studies: Secondary | ICD-10-CM | POA: Diagnosis not present

## 2020-02-22 DIAGNOSIS — E7800 Pure hypercholesterolemia, unspecified: Secondary | ICD-10-CM | POA: Diagnosis not present

## 2020-02-22 DIAGNOSIS — R7989 Other specified abnormal findings of blood chemistry: Secondary | ICD-10-CM

## 2020-02-22 LAB — HEPATIC FUNCTION PANEL
ALT: 16 U/L (ref 0–53)
AST: 45 U/L — ABNORMAL HIGH (ref 0–37)
Albumin: 4.1 g/dL (ref 3.5–5.2)
Alkaline Phosphatase: 68 U/L (ref 39–117)
Bilirubin, Direct: 0.2 mg/dL (ref 0.0–0.3)
Total Bilirubin: 0.8 mg/dL (ref 0.2–1.2)
Total Protein: 6.6 g/dL (ref 6.0–8.3)

## 2020-02-22 LAB — BASIC METABOLIC PANEL
BUN: 15 mg/dL (ref 6–23)
CO2: 32 mEq/L (ref 19–32)
Calcium: 9.4 mg/dL (ref 8.4–10.5)
Chloride: 100 mEq/L (ref 96–112)
Creatinine, Ser: 1.13 mg/dL (ref 0.40–1.50)
GFR: 64.67 mL/min (ref >60.00)
Glucose, Bld: 85 mg/dL (ref 70–99)
Potassium: 4.2 mEq/L (ref 3.5–5.1)
Sodium: 138 mEq/L (ref 135–145)

## 2020-02-22 LAB — LIPID PANEL
Cholesterol: 190 mg/dL (ref 0–200)
HDL: 44.6 mg/dL (ref >39.00)
LDL Cholesterol: 110 mg/dL — ABNORMAL HIGH (ref 0–99)
NonHDL: 145.79
Total CHOL/HDL Ratio: 4
Triglycerides: 180 mg/dL — ABNORMAL HIGH (ref 0.0–149.0)
VLDL: 36 mg/dL (ref 0.0–40.0)

## 2020-02-24 ENCOUNTER — Encounter: Payer: Self-pay | Admitting: Internal Medicine

## 2020-02-24 ENCOUNTER — Other Ambulatory Visit: Payer: Self-pay

## 2020-02-24 ENCOUNTER — Telehealth (INDEPENDENT_AMBULATORY_CARE_PROVIDER_SITE_OTHER): Payer: Medicare Other | Admitting: Internal Medicine

## 2020-02-24 VITALS — BP 133/69 | HR 65 | Ht 69.0 in | Wt 220.0 lb

## 2020-02-24 DIAGNOSIS — K219 Gastro-esophageal reflux disease without esophagitis: Secondary | ICD-10-CM | POA: Diagnosis not present

## 2020-02-24 DIAGNOSIS — J4520 Mild intermittent asthma, uncomplicated: Secondary | ICD-10-CM | POA: Diagnosis not present

## 2020-02-24 DIAGNOSIS — G4730 Sleep apnea, unspecified: Secondary | ICD-10-CM | POA: Diagnosis not present

## 2020-02-24 DIAGNOSIS — Z125 Encounter for screening for malignant neoplasm of prostate: Secondary | ICD-10-CM

## 2020-02-24 DIAGNOSIS — G2581 Restless legs syndrome: Secondary | ICD-10-CM | POA: Diagnosis not present

## 2020-02-24 DIAGNOSIS — E7800 Pure hypercholesterolemia, unspecified: Secondary | ICD-10-CM

## 2020-02-24 DIAGNOSIS — M79672 Pain in left foot: Secondary | ICD-10-CM

## 2020-02-24 DIAGNOSIS — R7989 Other specified abnormal findings of blood chemistry: Secondary | ICD-10-CM

## 2020-02-24 DIAGNOSIS — R945 Abnormal results of liver function studies: Secondary | ICD-10-CM

## 2020-02-24 DIAGNOSIS — M25561 Pain in right knee: Secondary | ICD-10-CM

## 2020-02-24 NOTE — Progress Notes (Signed)
Patient ID: Joseph Glimpse., male   DOB: 17-May-1945, 74 y.o.   MRN: [id number]   Virtual Visit via telphone Note  This visit type was conducted due to national recommendations for restrictions regarding the COVID-19 pandemic (e.g. social distancing).  This format is felt to be most appropriate for this patient at this time.  All issues noted in this document were discussed and addressed.  No physical exam was performed (except for noted visual exam findings with Video Visits).   I connected with Katrine Coho by  telephone and verified that I am speaking with the correct person using two identifiers. Location patient: home Location provider: work  Persons participating in the telephone isit: patient, provider  Te limitations, risks, security and privacy concerns of performing an evaluation and management service by telephone and the availability of in person appointments have been discussed. It has also been discussed with the patient that there may be a patient responsible charge related to this service. The patient expressed understanding and agreed to proceed.   Reason for visit: scheduled follow up.    HPI: Has been having problems with his right knee.  Saw ortho (Dr Marry Guan) - primary osteoarthritis - recommended TKA.  He has elected to hold on surgery.  He is having problems with left heel.  States has bone spurs.  Has tried inserts in shoes, etc.  Discussed referral to podiatry.  Tries to stay active.  No chest pain or sob reported.  No abdominal pain.  Bowels moving.     ROS: See pertinent positives and negatives per HPI.  Past Medical History:  Diagnosis Date  . Asthma   . Bladder outlet obstruction   . Crohn's disease ([place])    appendix, s/p appendectomy  . GERD (gastroesophageal reflux disease)    schatzki ring  . Hypercholesterolemia   . Restless leg syndrome   . Seizure disorder [facility])    age 89, previously on phenobarbital  . Skin cancer, basal cell   . Sleep apnea      Past Surgical History:  Procedure Laterality Date  . APPENDECTOMY  1992   lysis of adhesions (bowel blockage)  . inguinal hernia surgery  1999   left  . KNEE ARTHROSCOPY  1982  . [place]  . TONSILLECTOMY AND ADENOIDECTOMY  1956    Family History  Problem Relation Age of Onset  . Heart disease Father   . Congestive Heart Failure Mother   . Colon cancer Neg Hx   . Prostate cancer Neg Hx     SOCIAL HX: reviewed.    Current Outpatient Medications:  .  albuterol (PROVENTIL HFA;VENTOLIN HFA) 108 (90 Base) MCG/ACT inhaler, Inhale 2 puffs into the lungs every 6 (six) hours as needed for wheezing or shortness of breath., Disp: 1 Inhaler, Rfl: 2 .  BREO ELLIPTA 100-25 MCG/INH AEPB, TAKE 1 PUFF BY MOUTH EVERY DAY, Disp: 180 each, Rfl: 3 .  CALCIUM CITRATE PO, Take 1 tablet by mouth daily., Disp: , Rfl:  .  Cholecalciferol (VITAMIN D-3) 1000 UNITS CAPS, Take 1 capsule by mouth daily., Disp: , Rfl:  .  Coenzyme Q10 (COQ10) 100 MG CAPS, Take 100 mg by mouth daily., Disp: , Rfl:  .  finasteride (PROSCAR) 5 MG tablet, TAKE 1 TABLET BY MOUTH EVERY DAY, Disp: 90 tablet, Rfl: 1 .  Magnesium 250 MG TABS, Two tablets q day, Disp: , Rfl:  .  Multiple Vitamins-Minerals (CENTRUM SILVER 50+MEN PO), Take 1 tablet by mouth  daily., Disp: , Rfl:  .  omeprazole (PRILOSEC) 20 MG capsule, TAKE 1 CAPSULE (20 MG TOTAL) BY MOUTH 2 (TWO) TIMES DAILY BEFORE A MEAL., Disp: 180 capsule, Rfl: 1 .  pramipexole (MIRAPEX) 0.5 MG tablet, TAKE 1 TABLET BY MOUTH AT BEDTIME., Disp: 90 tablet, Rfl: 1 .  pravastatin (PRAVACHOL) 20 MG tablet, Take 1 tablet (20 mg total) by mouth daily., Disp: 90 tablet, Rfl: 1  EXAM:  GENERAL: alert.  Sounds to be in no acute distress.  Answering questions appropriately.    PSYCH/NEURO: pleasant and cooperative, no obvious depression or anxiety, speech and thought processing grossly intact  ASSESSMENT AND PLAN:  Discussed the following assessment and  plan:  Problem List Items Addressed This Visit    Sleep apnea    Continue cpap.        Right knee pain    Right knee pain - osteoarthritis.  Has seen ortho.  Surgery postponed.  Follow.        Restless leg syndrome    Doing well on mirapex.  Follow.        Pain of left heel    Has tried shoes/inserts.  Discussed podiatry referral.        Relevant Orders   Ambulatory referral to Podiatry   Hypercholesterolemia    The 10-year ASCVD risk score Mikey Bussing DC Brooke Bonito., et al., 2013) is: 23.8%   Values used to calculate the score:     Age: 53 years     Sex: Male     Is Non-Hispanic African American: No     Diabetic: No     Tobacco smoker: No     Systolic Blood Pressure: 993 mmHg     Is BP treated: No     HDL Cholesterol: 44.6 mg/dL     Total Cholesterol: 190 mg/dL   Discussed calculated cholesterol risk and ideal goal for LDL. Discussed increasing dose of pravastatin. Would prefer to remain on current dose.  Low cholesterol diet and exercise.  Follow lipid panel.        Relevant Orders   CBC with Differential/Platelet   TSH   Hepatic function panel   Lipid panel   Basic metabolic panel   GERD (gastroesophageal reflux disease)    No upper symptoms reported.  On omeprazole.        Asthma    On breo.  Breathing stable.  Follow.       Abnormal liver function test    Continue diet and exercise.  One liver test slightly elevated - stable.  Remainder of liver panel wnl.  Follow.        Other Visit Diagnoses    Prostate cancer screening    -  Primary   Relevant Orders   PSA, Medicare       I discussed the assessment and treatment plan with the patient. The patient was provided an opportunity to ask questions and all were answered. The patient agreed with the plan and demonstrated an understanding of the instructions.   The patient was advised to call back or seek an in-person evaluation if the symptoms worsen or if the condition fails to improve as anticipated.  I provided  22 minutes of non-face-to-face time during this encounter.   Einar Pheasant, MD

## 2020-02-24 NOTE — Assessment & Plan Note (Addendum)
The 10-year ASCVD risk score Mikey Bussing DC Brooke Bonito., et al., 2013) is: 23.8%   Values used to calculate the score:     Age: 74 years     Sex: Male     Is Non-Hispanic African American: No     Diabetic: No     Tobacco smoker: No     Systolic Blood Pressure: 924 mmHg     Is BP treated: No     HDL Cholesterol: 44.6 mg/dL     Total Cholesterol: 190 mg/dL   Discussed calculated cholesterol risk and ideal goal for LDL. Discussed increasing dose of pravastatin. Would prefer to remain on current dose.  Low cholesterol diet and exercise.  Follow lipid panel.

## 2020-02-29 ENCOUNTER — Encounter: Payer: Self-pay | Admitting: Internal Medicine

## 2020-02-29 DIAGNOSIS — M79672 Pain in left foot: Secondary | ICD-10-CM | POA: Insufficient documentation

## 2020-02-29 DIAGNOSIS — M25561 Pain in right knee: Secondary | ICD-10-CM | POA: Insufficient documentation

## 2020-02-29 NOTE — Assessment & Plan Note (Signed)
Doing well on mirapex.  Follow.   

## 2020-02-29 NOTE — Assessment & Plan Note (Signed)
Continue cpap.  

## 2020-02-29 NOTE — Assessment & Plan Note (Signed)
Continue diet and exercise.  One liver test slightly elevated - stable.  Remainder of liver panel wnl.  Follow.

## 2020-02-29 NOTE — Assessment & Plan Note (Signed)
Right knee pain - osteoarthritis.  Has seen ortho.  Surgery postponed.  Follow.

## 2020-02-29 NOTE — Assessment & Plan Note (Signed)
Has tried shoes/inserts.  Discussed podiatry referral.

## 2020-02-29 NOTE — Assessment & Plan Note (Signed)
No upper symptoms reported.  On omeprazole.  

## 2020-02-29 NOTE — Assessment & Plan Note (Signed)
On breo.  Breathing stable.  Follow.

## 2020-03-02 ENCOUNTER — Inpatient Hospital Stay: Admit: 2020-03-02 | Payer: Medicare Other | Admitting: Orthopedic Surgery

## 2020-03-02 SURGERY — ARTHROPLASTY, KNEE, TOTAL, USING IMAGELESS COMPUTER-ASSISTED NAVIGATION
Anesthesia: Choice | Site: Knee | Laterality: Right

## 2020-04-11 ENCOUNTER — Telehealth: Payer: Self-pay | Admitting: Internal Medicine

## 2020-04-11 NOTE — Telephone Encounter (Signed)
Rejection Reason - Patient did not respond" [facility] said on Apr 11, 2020 11:04 AM

## 2020-04-16 ENCOUNTER — Other Ambulatory Visit: Payer: Self-pay | Admitting: Internal Medicine

## 2020-04-16 ENCOUNTER — Encounter: Payer: Self-pay | Admitting: Internal Medicine

## 2020-04-22 DIAGNOSIS — Z23 Encounter for immunization: Secondary | ICD-10-CM | POA: Diagnosis not present

## 2020-06-30 ENCOUNTER — Ambulatory Visit (INDEPENDENT_AMBULATORY_CARE_PROVIDER_SITE_OTHER): Payer: Medicare Other

## 2020-06-30 VITALS — Ht 69.0 in | Wt 220.0 lb

## 2020-06-30 DIAGNOSIS — Z0000 Encounter for general adult medical examination without abnormal findings: Secondary | ICD-10-CM

## 2020-06-30 NOTE — Progress Notes (Signed)
Subjective:   Joseph Good. is a 75 y.o. male who presents for Medicare Annual/Subsequent preventive examination.  Review of Systems    No ROS.  Medicare Wellness Virtual Visit.  Cardiac Risk Factors include: advanced age (>68men, >24 women);male gender     Objective:    Today's Vitals   06/30/20 0910  Weight: 220 lb (99.8 kg)  Height: 5\' 9"  (1.753 m)   Body mass index is 32.49 kg/m.  Advanced Directives 06/30/2020 06/30/2019 10/07/2017 11/17/2016 09/13/2016 09/14/2015 07/31/2015  Does Patient Have a Medical Advance Directive? No No No No No No No  Does patient want to make changes to medical advance directive? No - Patient declined - - - - - -  Would patient like information on creating a medical advance directive? - No - Patient declined Yes (MAU/Ambulatory/Procedural Areas - Information given) No - Patient declined No - Patient declined No - patient declined information No - patient declined information    Current Medications (verified) Outpatient Encounter Medications as of 06/30/2020  Medication Sig  . albuterol (PROVENTIL HFA;VENTOLIN HFA) 108 (90 Base) MCG/ACT inhaler Inhale 2 puffs into the lungs every 6 (six) hours as needed for wheezing or shortness of breath.  Marland Kitchen BREO ELLIPTA 100-25 MCG/INH AEPB TAKE 1 PUFF BY MOUTH EVERY DAY  . CALCIUM CITRATE PO Take 1 tablet by mouth daily.  . Cholecalciferol (VITAMIN D-3) 1000 UNITS CAPS Take 1 capsule by mouth daily.  . Coenzyme Q10 (COQ10) 100 MG CAPS Take 100 mg by mouth daily.  . finasteride (PROSCAR) 5 MG tablet TAKE 1 TABLET BY MOUTH EVERY DAY  . Magnesium 250 MG TABS Two tablets q day  . Multiple Vitamins-Minerals (CENTRUM SILVER 50+MEN PO) Take 1 tablet by mouth daily.  Marland Kitchen omeprazole (PRILOSEC) 20 MG capsule TAKE 1 CAPSULE (20 MG TOTAL) BY MOUTH 2 (TWO) TIMES DAILY BEFORE A MEAL.  . pramipexole (MIRAPEX) 0.5 MG tablet TAKE 1 TABLET BY MOUTH AT BEDTIME.  . pravastatin (PRAVACHOL) 20 MG tablet Take 1 tablet (20 mg total) by mouth  daily.   No facility-administered encounter medications on file as of 06/30/2020.    Allergies (verified) Seldane [terfenadine]   History: Past Medical History:  Diagnosis Date  . Asthma   . Bladder outlet obstruction   . Crohn's disease ([place])    appendix, s/p appendectomy  . GERD (gastroesophageal reflux disease)    schatzki ring  . Hypercholesterolemia   . Restless leg syndrome   . Seizure disorder [facility])    age 33, previously on phenobarbital  . Skin cancer, basal cell   . Sleep apnea    Past Surgical History:  Procedure Laterality Date  . APPENDECTOMY  1992   lysis of adhesions (bowel blockage)  . inguinal hernia surgery  1999   left  . KNEE ARTHROSCOPY  1982  . [place]  . TONSILLECTOMY AND ADENOIDECTOMY  1956   Family History  Problem Relation Age of Onset  . Heart disease Father   . Congestive Heart Failure Mother   . Colon cancer Neg Hx   . Prostate cancer Neg Hx    Social History   Socioeconomic History  . Marital status: Married    Spouse name: Not on file  . Number of children: 0  . Years of education: Not on file  . Highest education level: Not on file  Occupational History  . Not on file  Tobacco Use  . Smoking status: Never Smoker  . Smokeless tobacco: Never  Used  Vaping Use  . Vaping Use: Never used  Substance and Sexual Activity  . Alcohol use: No    Alcohol/week: 0.0 standard drinks  . Drug use: No  . Sexual activity: Not Currently  Other Topics Concern  . Not on file  Social History Narrative  . Not on file   Social Determinants of Health   Financial Resource Strain: Low Risk   . Difficulty of Paying Living Expenses: Not hard at all  Food Insecurity: No Food Insecurity  . Worried About Charity fundraiser in the Last Year: Never true  . Ran Out of Food in the Last Year: Never true  Transportation Needs: No Transportation Needs  . Lack of Transportation (Medical): No  . Lack of Transportation (Non-Medical): No   Physical Activity: Sufficiently Active  . Days of Exercise per Week: 5 days  . Minutes of Exercise per Session: 30 min  Stress: No Stress Concern Present  . Feeling of Stress : Not at all  Social Connections: Unknown  . Frequency of Communication with Friends and Family: More than three times a week  . Frequency of Social Gatherings with Friends and Family: More than three times a week  . Attends Religious Services: Not on file  . Active Member of Clubs or Organizations: Not on file  . Attends Archivist Meetings: Not on file  . Marital Status: Married    Tobacco Counseling Counseling given: Not Answered   Clinical Intake:  Pre-visit preparation completed: Yes        Diabetes: No  How often do you need to have someone help you when you read instructions, pamphlets, or other written materials from your doctor or pharmacy?: 1 - Never   Interpreter Needed?: No      Activities of Daily Living In your present state of health, do you have any difficulty performing the following activities: 06/30/2020  Hearing? Y  Comment Hearing aids  Vision? N  Difficulty concentrating or making decisions? N  Walking or climbing stairs? N  Dressing or bathing? N  Doing errands, shopping? N  Preparing Food and eating ? N  Using the Toilet? N  In the past six months, have you accidently leaked urine? N  Do you have problems with loss of bowel control? N  Managing your Medications? N  Managing your Finances? N  Housekeeping or managing your Housekeeping? N  Some recent data might be hidden    Patient Care Team: Einar Pheasant, MD as PCP - General (Internal Medicine)  Indicate any recent Medical Services you may have received from other than [facility] providers in the past year (date may be approximate).     Assessment:   This is a routine wellness examination for Joseph Good.  I connected with Mattthew today by telephone and verified that I am speaking with the correct person  using two identifiers. Location patient: home Location provider: work Persons participating in the virtual visit: patient, Marine scientist.    I discussed the limitations, risks, security and privacy concerns of performing an evaluation and management service by telephone and the availability of in person appointments. The patient expressed understanding and verbally consented to this telephonic visit.    Interactive audio and video telecommunications were attempted between this provider and patient, however failed, due to patient having technical difficulties OR patient did not have access to video capability.  We continued and completed visit with audio only.  Some vital signs may be absent or patient reported.   Hearing/Vision  screen  Hearing Screening   125Hz  250Hz  500Hz  1000Hz  2000Hz  3000Hz  4000Hz  6000Hz  8000Hz   Right ear:           Left ear:           Comments: Hearing aids, bilateral  Vision Screening Comments: Cataract extraction, bilateral  Visual acuity not assessed, virtual visit. They have seen their ophthalmologist.   Dietary issues and exercise activities discussed: Current Exercise Habits: Home exercise routine, Type of exercise: calisthenics (treadmill), Intensity: Mild  Healthy diet Good water intake  Goals    . Follow up with Primary Care Provider     As needed      Depression Screen PHQ 2/9 Scores 06/30/2020 06/30/2019 02/12/2019 10/07/2017 09/13/2016 02/03/2016 09/14/2015  PHQ - 2 Score 0 0 0 0 0 0 0  PHQ- 9 Score - - - - 0 - -    Fall Risk Fall Risk  06/30/2020 02/24/2020 06/30/2019 02/12/2019 11/28/2017  Falls in the past year? 0 0 0 0 No  Comment - - - - Emmi Telephone Survey: data to providers prior to load  Number falls in past yr: 0 0 - - -  Injury with Fall? 0 0 - - -  Follow up Falls evaluation completed Falls evaluation completed Falls evaluation completed Falls evaluation completed -    FALL RISK PREVENTION PERTAINING TO THE HOME: Handrails in use when climbing  stairs? Yes Home free of loose throw rugs in walkways, pet beds, electrical cords, etc? Yes  Adequate lighting in your home to reduce risk of falls? Yes   ASSISTIVE DEVICES UTILIZED TO PREVENT FALLS: Use of a cane, walker or w/c? No   TIMED UP AND GO: Was the test performed? No . Virtual visit.   Cognitive Function: Patient is alert and oriented x3.  Denies difficulty focusing, making decisions, memory loss.  Enjoys playing chess and other brain health activities. MMSE/6CIT deferred. Normal by direct communication/observation.  MMSE - Mini Mental State Exam 10/07/2017 09/13/2016 09/14/2015  Orientation to time 5 5 5   Orientation to Place 5 5 5   Registration 3 3 3   Attention/ Calculation 5 5 5   Recall 3 3 3   Language- name 2 objects 2 2 2   Language- repeat 1 1 1   Language- follow 3 step command 3 3 3   Language- read & follow direction 1 1 1   Write a sentence 1 1 1   Copy design 1 1 1   Total score 30 30 30      6CIT Screen 06/30/2019  What Year? 0 points  What month? 0 points  What time? 0 points  Count back from 20 0 points  Months in reverse 0 points    Immunizations Immunization History  Administered Date(s) Administered  . Fluad Quad(high Dose 65+) 02/03/2019, 03/30/2019  . Influenza Split 03/02/2013  . Influenza, High Dose Seasonal PF 02/03/2016, 02/15/2017, 02/25/2018  . Influenza,inj,Quad PF,6+ Mos 01/22/2014, 01/31/2015  . Influenza-Unspecified 02/25/2018  . Janssen (J&J) SARS-COV-2 Vaccination 07/12/2019, 04/08/2020  . Pneumococcal Conjugate-13 07/22/2013  . Pneumococcal Polysaccharide-23 02/11/2015  . Tdap 12/30/2015   Health Maintenance Health Maintenance  Topic Date Due  . INFLUENZA VACCINE  07/28/2020 (Originally 11/29/2019)  . COLONOSCOPY (Pts 45-68yrs Insurance coverage will need to be confirmed)  12/29/2020  . TETANUS/TDAP  12/29/2025  . COVID-19 Vaccine  Completed  . Hepatitis C Screening  Completed  . PNA vac Low Risk Adult  Completed  . HPV VACCINES   Aged Out   Colorectal cancer screening: Type of screening: Colonoscopy. Completed 12/30/10.  Repeat every 10 years  Lung Cancer Screening: (Low Dose CT Chest recommended if Age 70-80 years, 30 pack-year currently smoking OR have quit w/in 15years.) does not qualify.   Dental Screening: Recommended annual dental exams for proper oral hygiene.  Community Resource Referral / Chronic Care Management: CRR required this visit?  No  CCM required this visit?  No      Plan:   Keep all routine maintenance appointments.   Next scheduled lab 06/24/20   Cpe 08/24/20  I have personally reviewed and noted the following in the patient's chart:   . Medical and social history . Use of alcohol, tobacco or illicit drugs  . Current medications and supplements . Functional ability and status . Nutritional status . Physical activity . Advanced directives . List of other physicians . Hospitalizations, surgeries, and ER visits in previous 12 months . Vitals . Screenings to include cognitive, depression, and falls . Referrals and appointments  In addition, I have reviewed and discussed with patient certain preventive protocols, quality metrics, and best practice recommendations. A written personalized care plan for preventive services as well as general preventive health recommendations were provided to patient via mychart.     Varney Biles, LPN   05/03/2765

## 2020-06-30 NOTE — Patient Instructions (Addendum)
Joseph Good , Thank you for taking time to come for your Medicare Wellness Visit. I appreciate your ongoing commitment to your health goals. Please review the following plan we discussed and let me know if I can assist you in the future.   These are the goals we discussed: Goals    . Follow up with Primary Care Provider     As needed       This is a list of the screening recommended for you and due dates:  Health Maintenance  Topic Date Due  . Flu Shot  07/28/2020*  . Colon Cancer Screening  12/29/2020  . Tetanus Vaccine  12/29/2025  . COVID-19 Vaccine  Completed  .  Hepatitis C: One time screening is recommended by Center for Disease Control  (CDC) for  adults born from [birth year] through 1964.   Completed  . Pneumonia vaccines  Completed  . HPV Vaccine  Aged Out  *Topic was postponed. The date shown is not the original due date.   Keep all routine maintenance appointments.   Next scheduled lab 06/24/20   Cpe 08/24/20  Advanced directives: not yet completed.   Conditions/risks identified: none new.  Follow up in one year for your annual wellness visit.   Preventive Care 33 Years and Older, Male Preventive care refers to lifestyle choices and visits with your health care provider that can promote health and wellness. What does preventive care include?  A yearly physical exam. This is also called an annual well check.  Dental exams once or twice a year.  Routine eye exams. Ask your health care provider how often you should have your eyes checked.  Personal lifestyle choices, including:  Daily care of your teeth and gums.  Regular physical activity.  Eating a healthy diet.  Avoiding tobacco and drug use.  Limiting alcohol use.  Practicing safe sex.  Taking low doses of aspirin every day.  Taking vitamin and mineral supplements as recommended by your health care provider. What happens during an annual well check? The services and screenings done by your health care  provider during your annual well check will depend on your age, overall health, lifestyle risk factors, and family history of disease. Counseling  Your health care provider may ask you questions about your:  Alcohol use.  Tobacco use.  Drug use.  Emotional well-being.  Home and relationship well-being.  Sexual activity.  Eating habits.  History of falls.  Memory and ability to understand (cognition).  Work and work Statistician. Screening  You may have the following tests or measurements:  Height, weight, and BMI.  Blood pressure.  Lipid and cholesterol levels. These may be checked every 5 years, or more frequently if you are over 62 years old.  Skin check.  Lung cancer screening. You may have this screening every year starting at age 60 if you have a 30-pack-year history of smoking and currently smoke or have quit within the past 15 years.  Fecal occult blood test (FOBT) of the stool. You may have this test every year starting at age 43.  Flexible sigmoidoscopy or colonoscopy. You may have a sigmoidoscopy every 5 years or a colonoscopy every 10 years starting at age 74.  Prostate cancer screening. Recommendations will vary depending on your family history and other risks.  Hepatitis C blood test.  Hepatitis B blood test.  Sexually transmitted disease (STD) testing.  Diabetes screening. This is done by checking your blood sugar (glucose) after you have not eaten for a  while (fasting). You may have this done every 1-3 years.  Abdominal aortic aneurysm (AAA) screening. You may need this if you are a current or former smoker.  Osteoporosis. You may be screened starting at age 7 if you are at high risk. Talk with your health care provider about your test results, treatment options, and if necessary, the need for more tests. Vaccines  Your health care provider may recommend certain vaccines, such as:  Influenza vaccine. This is recommended every year.  Tetanus,  diphtheria, and acellular pertussis (Tdap, Td) vaccine. You may need a Td booster every 10 years.  Zoster vaccine. You may need this after age 70.  Pneumococcal 13-valent conjugate (PCV13) vaccine. One dose is recommended after age 56.  Pneumococcal polysaccharide (PPSV23) vaccine. One dose is recommended after age 61. Talk to your health care provider about which screenings and vaccines you need and how often you need them. This information is not intended to replace advice given to you by your health care provider. Make sure you discuss any questions you have with your health care provider. Document Released: 05/13/2015 Document Revised: 01/04/2016 Document Reviewed: 02/15/2015 Elsevier Interactive Patient Education  2017 [place] Prevention in the Home Falls can cause injuries. They can happen to people of all ages. There are many things you can do to make your home safe and to help prevent falls. What can I do on the outside of my home?  Regularly fix the edges of walkways and driveways and fix any cracks.  Remove anything that might make you trip as you walk through a door, such as a raised step or threshold.  Trim any bushes or trees on the path to your home.  Use bright outdoor lighting.  Clear any walking paths of anything that might make someone trip, such as rocks or tools.  Regularly check to see if handrails are loose or broken. Make sure that both sides of any steps have handrails.  Any raised decks and porches should have guardrails on the edges.  Have any leaves, snow, or ice cleared regularly.  Use sand or salt on walking paths during winter.  Clean up any spills in your garage right away. This includes oil or grease spills. What can I do in the bathroom?  Use night lights.  Install grab bars by the toilet and in the tub and shower. Do not use towel bars as grab bars.  Use non-skid mats or decals in the tub or shower.  If you need to sit down in  the shower, use a plastic, non-slip stool.  Keep the floor dry. Clean up any water that spills on the floor as soon as it happens.  Remove soap buildup in the tub or shower regularly.  Attach bath mats securely with double-sided non-slip rug tape.  Do not have throw rugs and other things on the floor that can make you trip. What can I do in the bedroom?  Use night lights.  Make sure that you have a light by your bed that is easy to reach.  Do not use any sheets or blankets that are too big for your bed. They should not hang down onto the floor.  Have a firm chair that has side arms. You can use this for support while you get dressed.  Do not have throw rugs and other things on the floor that can make you trip. What can I do in the kitchen?  Clean up any spills right away.  Avoid walking on wet floors.  Keep items that you use a lot in easy-to-reach places.  If you need to reach something above you, use a strong step stool that has a grab bar.  Keep electrical cords out of the way.  Do not use floor polish or wax that makes floors slippery. If you must use wax, use non-skid floor wax.  Do not have throw rugs and other things on the floor that can make you trip. What can I do with my stairs?  Do not leave any items on the stairs.  Make sure that there are handrails on both sides of the stairs and use them. Fix handrails that are broken or loose. Make sure that handrails are as long as the stairways.  Check any carpeting to make sure that it is firmly attached to the stairs. Fix any carpet that is loose or worn.  Avoid having throw rugs at the top or bottom of the stairs. If you do have throw rugs, attach them to the floor with carpet tape.  Make sure that you have a light switch at the top of the stairs and the bottom of the stairs. If you do not have them, ask someone to add them for you. What else can I do to help prevent falls?  Wear shoes that:  Do not have high  heels.  Have rubber bottoms.  Are comfortable and fit you well.  Are closed at the toe. Do not wear sandals.  If you use a stepladder:  Make sure that it is fully opened. Do not climb a closed stepladder.  Make sure that both sides of the stepladder are locked into place.  Ask someone to hold it for you, if possible.  Clearly mark and make sure that you can see:  Any grab bars or handrails.  First and last steps.  Where the edge of each step is.  Use tools that help you move around (mobility aids) if they are needed. These include:  Canes.  Walkers.  Scooters.  Crutches.  Turn on the lights when you go into a dark area. Replace any light bulbs as soon as they burn out.  Set up your furniture so you have a clear path. Avoid moving your furniture around.  If any of your floors are uneven, fix them.  If there are any pets around you, be aware of where they are.  Review your medicines with your doctor. Some medicines can make you feel dizzy. This can increase your chance of falling. Ask your doctor what other things that you can do to help prevent falls. This information is not intended to replace advice given to you by your health care provider. Make sure you discuss any questions you have with your health care provider. Document Released: 02/10/2009 Document Revised: 09/22/2015 Document Reviewed: 05/21/2014 Elsevier Interactive Patient Education  2017 [employer].

## 2020-07-13 ENCOUNTER — Other Ambulatory Visit: Payer: Self-pay | Admitting: Internal Medicine

## 2020-07-22 IMAGING — DX DG CHEST 2V
2 series · 2 of 2 positions shown · non-contrast
Comparison: Chest x-ray of November 17, 2016

CLINICAL DATA: Several weeks of shortness of breath. History of
asthma. Never smoked.

EXAM:
CHEST - 2 VIEW

[chest pa]
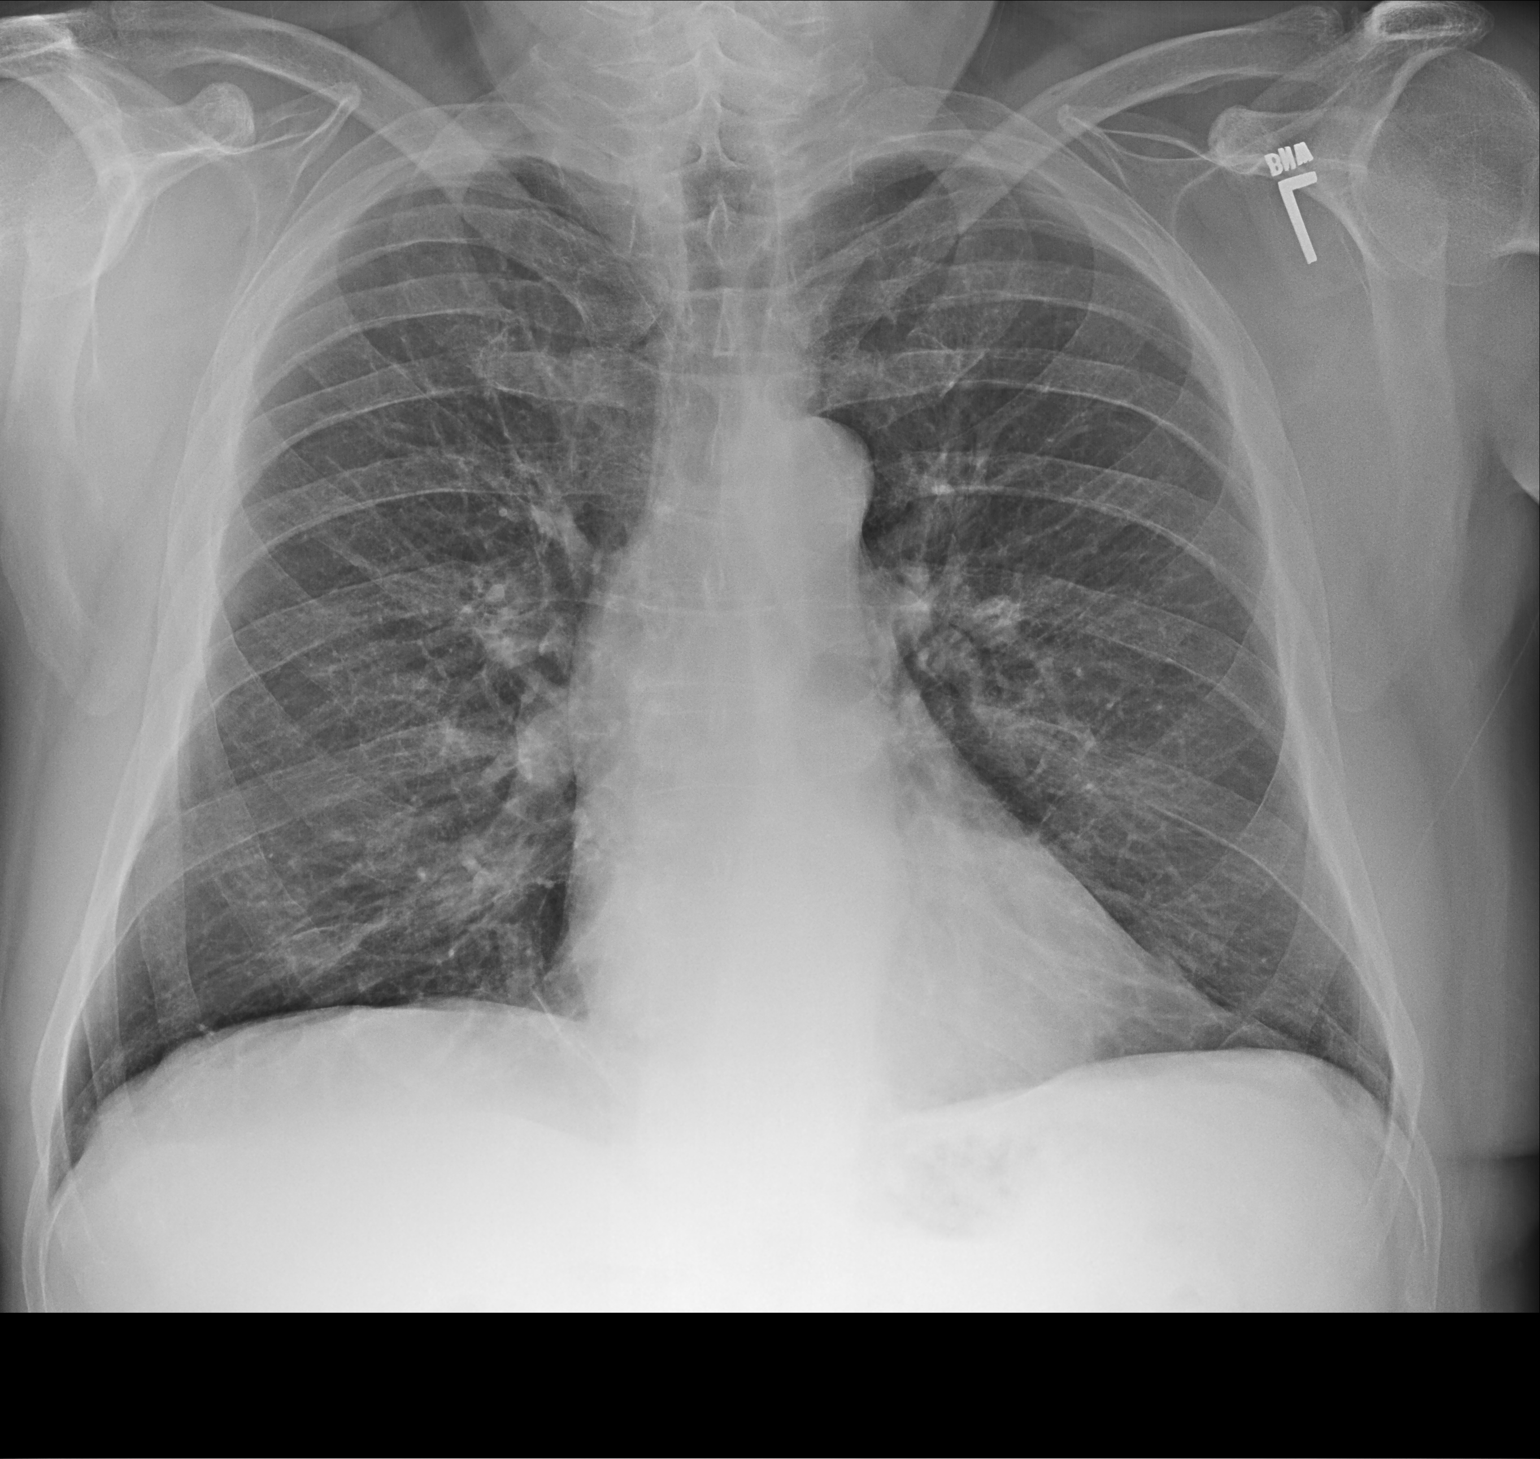

[chest lat]
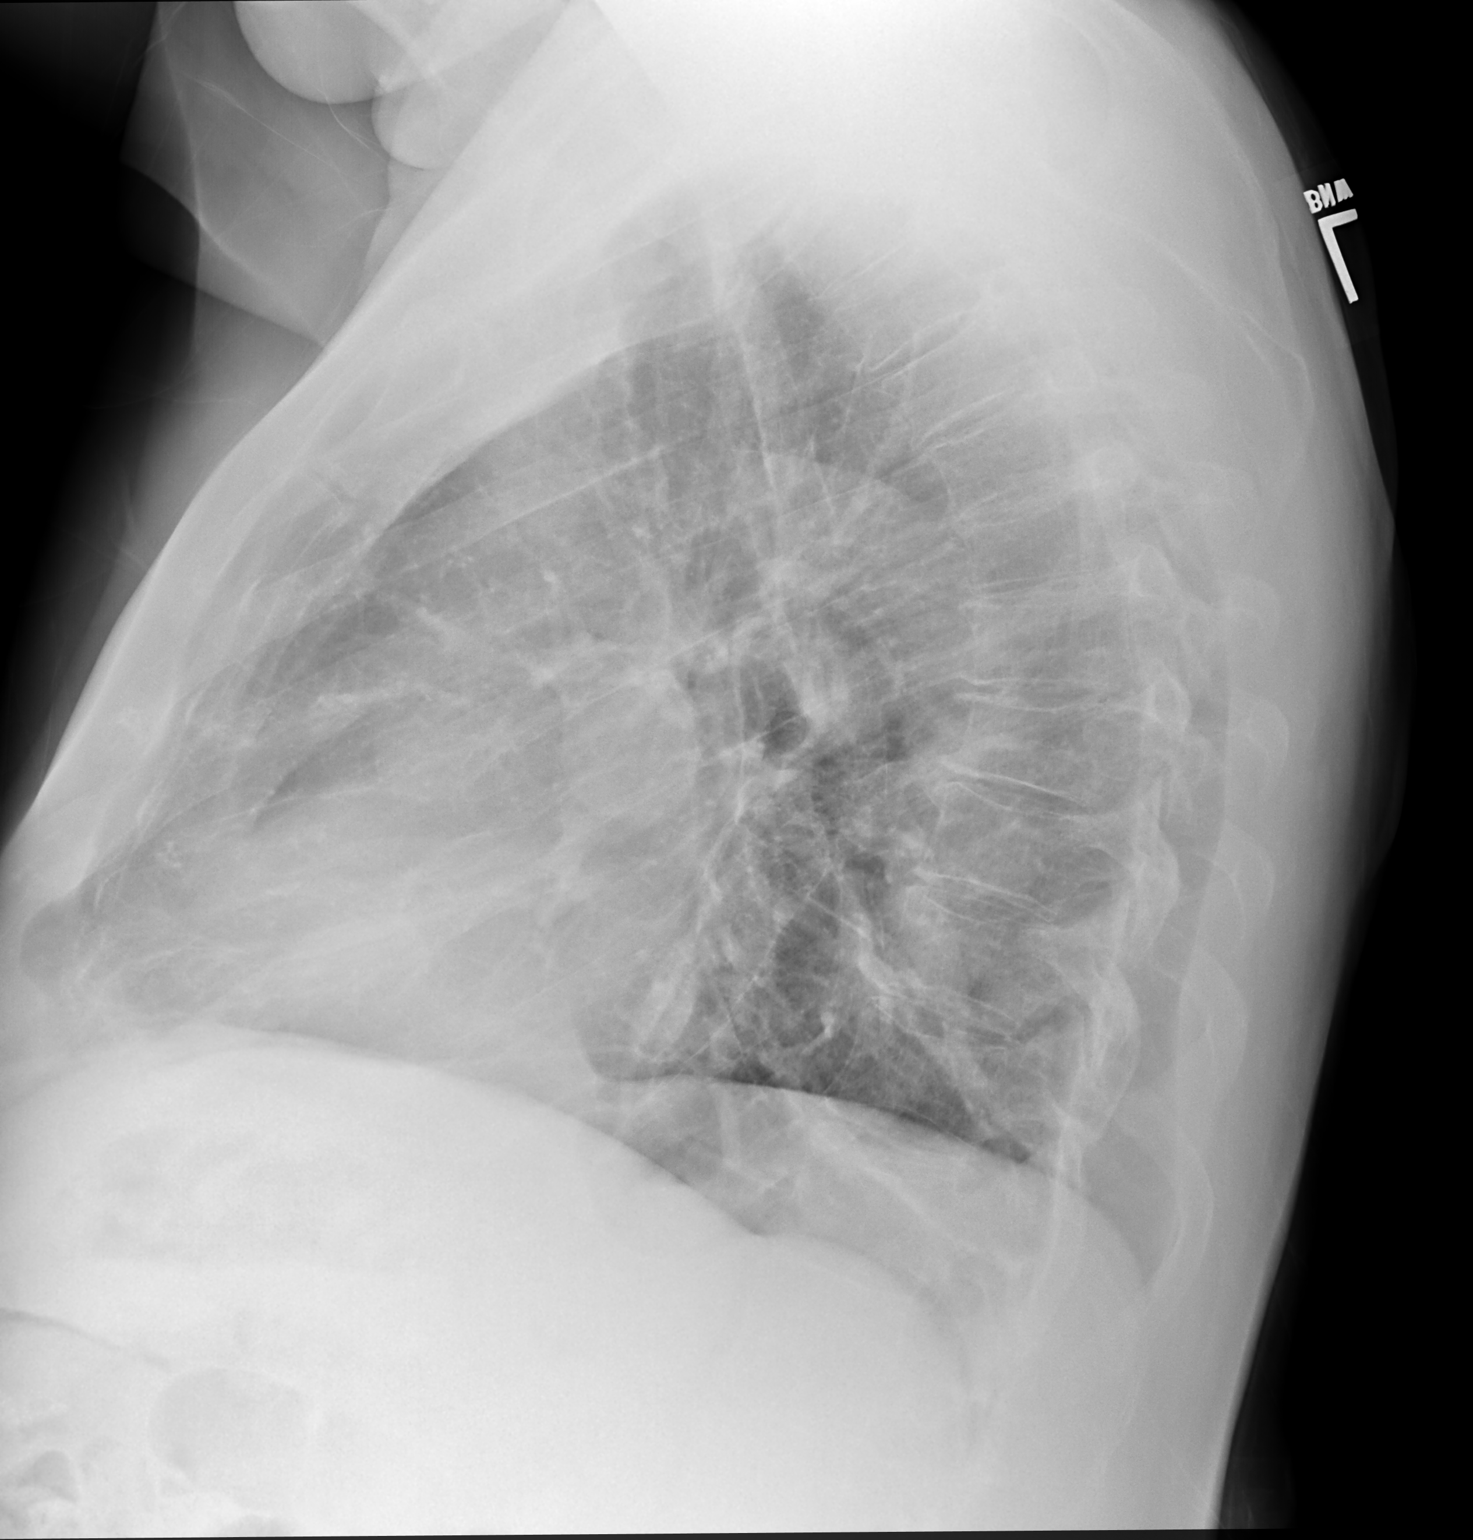

[2 of 2 positions shown; findings below may reference images not displayed]

FINDINGS: The lungs are well-expanded. The interstitial markings are
chronically increased but are more conspicuous overall today. There
is no alveolar infiltrate or pleural effusion. The heart and
pulmonary vascularity are normal. The trachea is midline. The bony
thorax exhibits no acute abnormality. There is chronic mild partial
compression of the body of T8.
IMPRESSION: Chronic bronchitic-reactive airway changes more conspicuous than in
the past. This may reflect superimposed acute bronchitis. No
alveolar pneumonia nor pulmonary edema.

## 2020-08-22 ENCOUNTER — Other Ambulatory Visit (INDEPENDENT_AMBULATORY_CARE_PROVIDER_SITE_OTHER): Payer: Medicare Other

## 2020-08-22 ENCOUNTER — Other Ambulatory Visit: Payer: Self-pay

## 2020-08-22 DIAGNOSIS — Z125 Encounter for screening for malignant neoplasm of prostate: Secondary | ICD-10-CM | POA: Diagnosis not present

## 2020-08-22 DIAGNOSIS — E7800 Pure hypercholesterolemia, unspecified: Secondary | ICD-10-CM | POA: Diagnosis not present

## 2020-08-22 LAB — BASIC METABOLIC PANEL
BUN: 17 mg/dL (ref 6–23)
CO2: 32 mEq/L (ref 19–32)
Calcium: 9.4 mg/dL (ref 8.4–10.5)
Chloride: 103 mEq/L (ref 96–112)
Creatinine, Ser: 1.13 mg/dL (ref 0.40–1.50)
GFR: 64.44 mL/min (ref >60.00)
Glucose, Bld: 87 mg/dL (ref 70–99)
Potassium: 4.7 mEq/L (ref 3.5–5.1)
Sodium: 139 mEq/L (ref 135–145)

## 2020-08-22 LAB — CBC WITH DIFFERENTIAL/PLATELET
Basophils Absolute: 0 10*3/uL (ref 0.0–0.1)
Basophils Relative: 0.8 % (ref 0.0–3.0)
Eosinophils Absolute: 0.2 10*3/uL (ref 0.0–0.7)
Eosinophils Relative: 3 % (ref 0.0–5.0)
HCT: 46.2 % (ref 39.0–52.0)
Hemoglobin: 15.6 g/dL (ref 13.0–17.0)
Lymphocytes Relative: 22.4 % (ref 12.0–46.0)
Lymphs Abs: 1.2 10*3/uL (ref 0.7–4.0)
MCHC: 33.8 g/dL (ref 30.0–36.0)
MCV: 97.4 fl (ref 78.0–100.0)
Monocytes Absolute: 0.6 10*3/uL (ref 0.1–1.0)
Monocytes Relative: 11.3 % (ref 3.0–12.0)
Neutro Abs: 3.2 10*3/uL (ref 1.4–7.7)
Neutrophils Relative %: 62.5 % (ref 43.0–77.0)
Platelets: 215 10*3/uL (ref 150.0–400.0)
RBC: 4.74 Mil/uL (ref 4.22–5.81)
RDW: 13.6 % (ref 11.5–15.5)
WBC: 5.2 10*3/uL (ref 4.0–10.5)

## 2020-08-22 LAB — HEPATIC FUNCTION PANEL
ALT: 17 U/L (ref 0–53)
AST: 47 U/L — ABNORMAL HIGH (ref 0–37)
Albumin: 3.8 g/dL (ref 3.5–5.2)
Alkaline Phosphatase: 62 U/L (ref 39–117)
Bilirubin, Direct: 0.1 mg/dL (ref 0.0–0.3)
Total Bilirubin: 0.8 mg/dL (ref 0.2–1.2)
Total Protein: 6.7 g/dL (ref 6.0–8.3)

## 2020-08-22 LAB — LIPID PANEL
Cholesterol: 158 mg/dL (ref 0–200)
HDL: 48.6 mg/dL (ref >39.00)
LDL Cholesterol: 95 mg/dL (ref 0–99)
NonHDL: 109.59
Total CHOL/HDL Ratio: 3
Triglycerides: 75 mg/dL (ref 0.0–149.0)
VLDL: 15 mg/dL (ref 0.0–40.0)

## 2020-08-22 LAB — TSH: TSH: 1.95 u[IU]/mL (ref 0.35–4.50)

## 2020-08-22 LAB — PSA, MEDICARE: PSA: 1.07 ng/ml (ref 0.10–4.00)

## 2020-08-24 ENCOUNTER — Encounter: Payer: Medicare Other | Admitting: Internal Medicine

## 2020-08-30 ENCOUNTER — Telehealth: Payer: Self-pay | Admitting: Internal Medicine

## 2020-08-30 NOTE — Telephone Encounter (Signed)
Patient is aware of results.

## 2020-08-30 NOTE — Telephone Encounter (Signed)
Notify Mr Krone - cholesterol improved.  One liver test is stable.  Slightly elevated but stable.  Remainder of liver panel wnl.  PSA, hgb, thyroid test and kidney function tests are wnl.  Per wifes message, had to cancel appt.  Need to reschedule.

## 2020-09-15 ENCOUNTER — Encounter: Payer: Self-pay | Admitting: Internal Medicine

## 2020-09-16 NOTE — Telephone Encounter (Signed)
Can schedule appt to discuss.   

## 2020-09-19 NOTE — Telephone Encounter (Signed)
Patient would like to wait until scheduled appt in a few months to discuss.

## 2020-10-19 ENCOUNTER — Other Ambulatory Visit: Payer: Self-pay | Admitting: Internal Medicine

## 2020-11-15 ENCOUNTER — Ambulatory Visit: Payer: Self-pay | Admitting: Urology

## 2020-11-30 ENCOUNTER — Encounter: Payer: Medicare Other | Admitting: Internal Medicine

## 2021-01-10 ENCOUNTER — Other Ambulatory Visit: Payer: Self-pay | Admitting: Internal Medicine

## 2021-01-14 ENCOUNTER — Other Ambulatory Visit: Payer: Self-pay | Admitting: Internal Medicine

## 2021-01-16 DIAGNOSIS — L570 Actinic keratosis: Secondary | ICD-10-CM | POA: Diagnosis not present

## 2021-01-16 DIAGNOSIS — D485 Neoplasm of uncertain behavior of skin: Secondary | ICD-10-CM | POA: Diagnosis not present

## 2021-01-16 DIAGNOSIS — L820 Inflamed seborrheic keratosis: Secondary | ICD-10-CM | POA: Diagnosis not present

## 2021-01-16 DIAGNOSIS — L821 Other seborrheic keratosis: Secondary | ICD-10-CM | POA: Diagnosis not present

## 2021-02-01 ENCOUNTER — Other Ambulatory Visit: Payer: Self-pay

## 2021-02-01 ENCOUNTER — Ambulatory Visit (INDEPENDENT_AMBULATORY_CARE_PROVIDER_SITE_OTHER): Payer: Medicare Other | Admitting: Internal Medicine

## 2021-02-01 VITALS — BP 118/60 | HR 62 | Temp 97.0°F | Resp 16 | Ht 69.0 in | Wt 228.4 lb

## 2021-02-01 DIAGNOSIS — E7800 Pure hypercholesterolemia, unspecified: Secondary | ICD-10-CM | POA: Diagnosis not present

## 2021-02-01 DIAGNOSIS — G4730 Sleep apnea, unspecified: Secondary | ICD-10-CM | POA: Diagnosis not present

## 2021-02-01 DIAGNOSIS — R002 Palpitations: Secondary | ICD-10-CM | POA: Diagnosis not present

## 2021-02-01 DIAGNOSIS — K219 Gastro-esophageal reflux disease without esophagitis: Secondary | ICD-10-CM | POA: Diagnosis not present

## 2021-02-01 DIAGNOSIS — J4520 Mild intermittent asthma, uncomplicated: Secondary | ICD-10-CM

## 2021-02-01 NOTE — Progress Notes (Signed)
Patient ID: Joseph Glimpse., male   DOB: 10/22/1945, 75 y.o.   MRN: [id number]   Subjective:    Patient ID: Joseph Glimpse., male    DOB: [birth year], 75 y.o.   MRN: [id number]  This visit occurred during the SARS-CoV-2 public health emergency.  Safety protocols were in place, including screening questions prior to the visit, additional usage of staff PPE, and extensive cleaning of exam room while observing appropriate contact time as indicated for disinfecting solutions.   Patient here for work in appt.   Chief Complaint  Patient presents with   Irregular Heart Beat   .   HPI Work in with concerns regarding premature beats.  States he was checking his pulse yesterday and noticed premature beats.  No symptoms.  No chest pain.  No sob.  No increased heart rate or palpitations.  No acid reflux.  No abdominal pain.  Bowels moving.  Has noticed some fatigue, but no other symptoms.  Using cpap.  Called cardiology.  Has appt next week to discuss.     Past Medical History:  Diagnosis Date   Asthma    Bladder outlet obstruction    Crohn's disease ([place])    appendix, s/p appendectomy   GERD (gastroesophageal reflux disease)    schatzki ring   Hypercholesterolemia    Restless leg syndrome    Seizure disorder [facility])    age 75, previously on phenobarbital   Skin cancer, basal cell    Sleep apnea    Past Surgical History:  Procedure Laterality Date   APPENDECTOMY  1992   lysis of adhesions (bowel blockage)   inguinal hernia surgery  1999   left   KNEE ARTHROSCOPY  [place]   TONSILLECTOMY AND ADENOIDECTOMY  1956   Family History  Problem Relation Age of Onset   Heart disease Father    Congestive Heart Failure Mother    Colon cancer Neg Hx    Prostate cancer Neg Hx    Social History   Socioeconomic History   Marital status: Married    Spouse name: Not on file   Number of children: 0   Years of education: Not on file   Highest education level: Not  on file  Occupational History   Not on file  Tobacco Use   Smoking status: Never   Smokeless tobacco: Never  Vaping Use   Vaping Use: Never used  Substance and Sexual Activity   Alcohol use: No    Alcohol/week: 0.0 standard drinks   Drug use: No   Sexual activity: Not Currently  Other Topics Concern   Not on file  Social History Narrative   Not on file   Social Determinants of Health   Financial Resource Strain: Low Risk    Difficulty of Paying Living Expenses: Not hard at all  Food Insecurity: No Food Insecurity   Worried About Charity fundraiser in the Last Year: Never true   Ran Out of Food in the Last Year: Never true  Transportation Needs: No Transportation Needs   Lack of Transportation (Medical): No   Lack of Transportation (Non-Medical): No  Physical Activity: Sufficiently Active   Days of Exercise per Week: 5 days   Minutes of Exercise per Session: 30 min  Stress: No Stress Concern Present   Feeling of Stress : Not at all  Social Connections: Unknown   Frequency of Communication with Friends and Family: More than three times a week  Frequency of Social Gatherings with Friends and Family: More than three times a week   Attends Religious Services: Not on file   Active Member of Clubs or Organizations: Not on file   Attends Archivist Meetings: Not on file   Marital Status: Married     Review of Systems  Constitutional:  Positive for fatigue. Negative for appetite change and unexpected weight change.  HENT:  Negative for congestion and sinus pressure.   Respiratory:  Negative for cough, chest tightness and shortness of breath.   Cardiovascular:  Negative for chest pain and leg swelling.       Premature beats as outlined.   Gastrointestinal:  Negative for abdominal pain, diarrhea and nausea.  Genitourinary:  Negative for difficulty urinating and dysuria.  Musculoskeletal:  Negative for joint swelling and myalgias.  Neurological:  Negative for  dizziness, light-headedness and headaches.  Psychiatric/Behavioral:  Negative for agitation and dysphoric mood.       Objective:     BP 118/60   Pulse 62   Temp (!) 97 F (36.1 C)   Resp 16   Ht 5\' 9"  (1.753 m)   Wt 228 lb 6.4 oz (103.6 kg)   SpO2 98%   BMI 33.73 kg/m  Wt Readings from Last 3 Encounters:  02/01/21 228 lb 6.4 oz (103.6 kg)  06/30/20 220 lb (99.8 kg)  02/24/20 220 lb (99.8 kg)    Physical Exam Vitals reviewed.  Constitutional:      General: He is not in acute distress.    Appearance: Normal appearance. He is well-developed.  HENT:     Head: Normocephalic and atraumatic.     Right Ear: External ear normal.     Left Ear: External ear normal.  Eyes:     General: No scleral icterus.       Right eye: No discharge.        Left eye: No discharge.     Conjunctiva/sclera: Conjunctivae normal.  Cardiovascular:     Rate and Rhythm: Normal rate and regular rhythm.  Pulmonary:     Effort: Pulmonary effort is normal. No respiratory distress.     Breath sounds: Normal breath sounds.  Abdominal:     General: Bowel sounds are normal.     Palpations: Abdomen is soft.     Tenderness: There is no abdominal tenderness.  Musculoskeletal:        General: No swelling or tenderness.     Cervical back: Neck supple. No tenderness.  Lymphadenopathy:     Cervical: No cervical adenopathy.  Skin:    Findings: No erythema or rash.  Neurological:     Mental Status: He is alert.  Psychiatric:        Mood and Affect: Mood normal.        Behavior: Behavior normal.     Outpatient Encounter Medications as of 02/01/2021  Medication Sig   albuterol (PROVENTIL HFA;VENTOLIN HFA) 108 (90 Base) MCG/ACT inhaler Inhale 2 puffs into the lungs every 6 (six) hours as needed for wheezing or shortness of breath.   BREO ELLIPTA 100-25 MCG/INH AEPB INHALE 1 PUFF BY MOUTH EVERY DAY   CALCIUM CITRATE PO Take 1 tablet by mouth daily.   Cholecalciferol (VITAMIN D-3) 1000 UNITS CAPS Take 1  capsule by mouth daily.   Coenzyme Q10 (COQ10) 100 MG CAPS Take 100 mg by mouth daily.   finasteride (PROSCAR) 5 MG tablet TAKE 1 TABLET BY MOUTH EVERY DAY   Magnesium 250 MG TABS Two tablets  q day   Multiple Vitamins-Minerals (CENTRUM SILVER 50+MEN PO) Take 1 tablet by mouth daily.   omeprazole (PRILOSEC) 20 MG capsule TAKE 1 CAPSULE (20 MG TOTAL) BY MOUTH 2 (TWO) TIMES DAILY BEFORE A MEAL.   pramipexole (MIRAPEX) 0.5 MG tablet TAKE 1 TABLET BY MOUTH EVERYDAY AT BEDTIME   pravastatin (PRAVACHOL) 20 MG tablet TAKE 1 TABLET BY MOUTH EVERY DAY   No facility-administered encounter medications on file as of 02/01/2021.     Lab Results  Component Value Date   WBC 4.9 02/01/2021   HGB 15.1 02/01/2021   HCT 45.3 02/01/2021   PLT 205.0 02/01/2021   GLUCOSE 86 02/01/2021   CHOL 158 08/22/2020   TRIG 75.0 08/22/2020   HDL 48.60 08/22/2020   LDLDIRECT 154.7 05/22/2013   LDLCALC 95 08/22/2020   ALT 14 02/01/2021   AST 43 (H) 02/01/2021   NA 140 02/01/2021   K 4.2 02/01/2021   CL 102 02/01/2021   CREATININE 1.36 02/01/2021   BUN 17 02/01/2021   CO2 32 02/01/2021   TSH 2.15 02/01/2021   PSA 1.07 08/22/2020   INR 1.00 11/17/2016    MR BRAIN W WO CONTRAST  Result Date: 12/01/2016 CLINICAL DATA:  History of seizure.  Head seizures as a youth. EXAM: MRI HEAD WITHOUT AND WITH CONTRAST TECHNIQUE: Multiplanar, multiecho pulse sequences of the brain and surrounding structures were obtained without and with intravenous contrast. CONTRAST:  15mL MULTIHANCE GADOBENATE DIMEGLUMINE 529 MG/ML IV SOLN COMPARISON:  Head CT 11/17/2016 FINDINGS: Brain: No explanation for seizure. No cortical scarring or mass. Accounting for remnant cysts, the hippocampi are symmetric and unremarkable. Normal brain volume. Very mild hyperintensity in the cerebral white matter, usually attributed to remote microvascular insults. Lacunar infarcts described on previous head CT are instead dilated perivascular spaces. No specific  demyelinating pattern. Vascular: Negative. Skull and upper cervical spine: Negative for marrow lesion Sinuses/Orbits: Focal right maxillary sinusitis with T2 hypointense fluid level that may be proteinaceous. Bilateral cataract resection. IMPRESSION: 1. No explanation for seizure.  Unremarkable brain MRI. 2. Structures described is lacunar infarcts on head CT 11/17/2016 are instead dilated perivascular spaces. 3. Right maxillary sinusitis. Electronically Signed   By: Monte Fantasia M.D.   On: 12/01/2016 15:19       Assessment & Plan:   Problem List Items Addressed This Visit     Asthma    Continue breo.  Breathing stable.        GERD (gastroesophageal reflux disease)    No upper symptoms reported.  On omeprazole.        Hypercholesterolemia - Primary    Continue statin medication.  Low cholesterol diet and exercise.  Follow lipid panel and liver function tests.        Relevant Orders   Hepatic function panel (Completed)   Palpitations    Noticed premature beats while palpating pulse.  No chest pain.  No sob.  No notice of increased heart rate or palpitations.  EKG - SR with frequent PVCs.  Check routine labs, including electrolytes.  Continue cpap.  Avoid increased stimulants/caffeine.  Keep appt with cardiology.  If any change in symptoms, needs to be evaluated.        Relevant Orders   EKG 12-Lead (Completed)   CBC with Differential/Platelet (Completed)   TSH (Completed)   Basic metabolic panel (Completed)   Sleep apnea    Continue cpap.         Einar Pheasant, MD

## 2021-02-02 LAB — CBC WITH DIFFERENTIAL/PLATELET
Basophils Absolute: 0 10*3/uL (ref 0.0–0.1)
Basophils Relative: 0.7 % (ref 0.0–3.0)
Eosinophils Absolute: 0.1 10*3/uL (ref 0.0–0.7)
Eosinophils Relative: 2.2 % (ref 0.0–5.0)
HCT: 45.3 % (ref 39.0–52.0)
Hemoglobin: 15.1 g/dL (ref 13.0–17.0)
Lymphocytes Relative: 30.2 % (ref 12.0–46.0)
Lymphs Abs: 1.5 10*3/uL (ref 0.7–4.0)
MCHC: 33.4 g/dL (ref 30.0–36.0)
MCV: 96.3 fl (ref 78.0–100.0)
Monocytes Absolute: 0.5 10*3/uL (ref 0.1–1.0)
Monocytes Relative: 10.6 % (ref 3.0–12.0)
Neutro Abs: 2.7 10*3/uL (ref 1.4–7.7)
Neutrophils Relative %: 56.3 % (ref 43.0–77.0)
Platelets: 205 10*3/uL (ref 150.0–400.0)
RBC: 4.7 Mil/uL (ref 4.22–5.81)
RDW: 13.3 % (ref 11.5–15.5)
WBC: 4.9 10*3/uL (ref 4.0–10.5)

## 2021-02-02 LAB — HEPATIC FUNCTION PANEL
ALT: 14 U/L (ref 0–53)
AST: 43 U/L — ABNORMAL HIGH (ref 0–37)
Albumin: 3.8 g/dL (ref 3.5–5.2)
Alkaline Phosphatase: 61 U/L (ref 39–117)
Bilirubin, Direct: 0.1 mg/dL (ref 0.0–0.3)
Total Bilirubin: 0.6 mg/dL (ref 0.2–1.2)
Total Protein: 6.3 g/dL (ref 6.0–8.3)

## 2021-02-02 LAB — BASIC METABOLIC PANEL
BUN: 17 mg/dL (ref 6–23)
CO2: 32 mEq/L (ref 19–32)
Calcium: 9.1 mg/dL (ref 8.4–10.5)
Chloride: 102 mEq/L (ref 96–112)
Creatinine, Ser: 1.36 mg/dL (ref 0.40–1.50)
GFR: 51.43 mL/min — ABNORMAL LOW (ref >60.00)
Glucose, Bld: 86 mg/dL (ref 70–99)
Potassium: 4.2 mEq/L (ref 3.5–5.1)
Sodium: 140 mEq/L (ref 135–145)

## 2021-02-02 LAB — TSH: TSH: 2.15 u[IU]/mL (ref 0.35–5.50)

## 2021-02-03 ENCOUNTER — Telehealth: Payer: Self-pay

## 2021-02-03 NOTE — Telephone Encounter (Signed)
LMTCB regarding labs 

## 2021-02-05 ENCOUNTER — Encounter: Payer: Self-pay | Admitting: Internal Medicine

## 2021-02-05 NOTE — Assessment & Plan Note (Signed)
Continue breo.  Breathing stable.

## 2021-02-05 NOTE — Assessment & Plan Note (Signed)
Continue cpap.  

## 2021-02-05 NOTE — Assessment & Plan Note (Signed)
Noticed premature beats while palpating pulse.  No chest pain.  No sob.  No notice of increased heart rate or palpitations.  EKG - SR with frequent PVCs.  Check routine labs, including electrolytes.  Continue cpap.  Avoid increased stimulants/caffeine.  Keep appt with cardiology.  If any change in symptoms, needs to be evaluated.

## 2021-02-05 NOTE — Assessment & Plan Note (Signed)
No upper symptoms reported.  On omeprazole.  

## 2021-02-05 NOTE — Assessment & Plan Note (Signed)
Continue statin medication.  Low cholesterol diet and exercise.  Follow lipid panel and liver function tests.   

## 2021-02-07 DIAGNOSIS — R9431 Abnormal electrocardiogram [ECG] [EKG]: Secondary | ICD-10-CM | POA: Diagnosis not present

## 2021-02-07 DIAGNOSIS — Z8669 Personal history of other diseases of the nervous system and sense organs: Secondary | ICD-10-CM | POA: Diagnosis not present

## 2021-02-07 DIAGNOSIS — G4733 Obstructive sleep apnea (adult) (pediatric): Secondary | ICD-10-CM | POA: Diagnosis not present

## 2021-02-07 DIAGNOSIS — R002 Palpitations: Secondary | ICD-10-CM | POA: Diagnosis not present

## 2021-02-07 DIAGNOSIS — E7800 Pure hypercholesterolemia, unspecified: Secondary | ICD-10-CM | POA: Diagnosis not present

## 2021-02-07 DIAGNOSIS — R0602 Shortness of breath: Secondary | ICD-10-CM | POA: Diagnosis not present

## 2021-02-07 NOTE — Telephone Encounter (Signed)
Patient aware of labs.  

## 2021-02-07 NOTE — Telephone Encounter (Signed)
Patient is returning your call,please call him at [phone].

## 2021-02-15 DIAGNOSIS — I499 Cardiac arrhythmia, unspecified: Secondary | ICD-10-CM | POA: Diagnosis not present

## 2021-02-15 DIAGNOSIS — L570 Actinic keratosis: Secondary | ICD-10-CM | POA: Diagnosis not present

## 2021-02-15 DIAGNOSIS — G4733 Obstructive sleep apnea (adult) (pediatric): Secondary | ICD-10-CM | POA: Diagnosis not present

## 2021-02-23 DIAGNOSIS — R002 Palpitations: Secondary | ICD-10-CM | POA: Diagnosis not present

## 2021-02-27 DIAGNOSIS — R9431 Abnormal electrocardiogram [ECG] [EKG]: Secondary | ICD-10-CM | POA: Diagnosis not present

## 2021-02-27 DIAGNOSIS — R0602 Shortness of breath: Secondary | ICD-10-CM | POA: Diagnosis not present

## 2021-02-27 DIAGNOSIS — R002 Palpitations: Secondary | ICD-10-CM | POA: Diagnosis not present

## 2021-03-03 ENCOUNTER — Ambulatory Visit (INDEPENDENT_AMBULATORY_CARE_PROVIDER_SITE_OTHER): Payer: Medicare Other | Admitting: Internal Medicine

## 2021-03-03 ENCOUNTER — Other Ambulatory Visit: Payer: Self-pay

## 2021-03-03 VITALS — BP 118/70 | HR 62 | Temp 97.6°F | Resp 16 | Ht 69.0 in | Wt 228.0 lb

## 2021-03-03 DIAGNOSIS — Z23 Encounter for immunization: Secondary | ICD-10-CM | POA: Diagnosis not present

## 2021-03-03 DIAGNOSIS — G2581 Restless legs syndrome: Secondary | ICD-10-CM | POA: Diagnosis not present

## 2021-03-03 DIAGNOSIS — J4520 Mild intermittent asthma, uncomplicated: Secondary | ICD-10-CM

## 2021-03-03 DIAGNOSIS — E7800 Pure hypercholesterolemia, unspecified: Secondary | ICD-10-CM | POA: Diagnosis not present

## 2021-03-03 DIAGNOSIS — G4730 Sleep apnea, unspecified: Secondary | ICD-10-CM | POA: Diagnosis not present

## 2021-03-03 DIAGNOSIS — R7989 Other specified abnormal findings of blood chemistry: Secondary | ICD-10-CM

## 2021-03-03 DIAGNOSIS — R002 Palpitations: Secondary | ICD-10-CM

## 2021-03-03 DIAGNOSIS — Z0000 Encounter for general adult medical examination without abnormal findings: Secondary | ICD-10-CM

## 2021-03-03 DIAGNOSIS — K219 Gastro-esophageal reflux disease without esophagitis: Secondary | ICD-10-CM | POA: Diagnosis not present

## 2021-03-03 DIAGNOSIS — K4090 Unilateral inguinal hernia, without obstruction or gangrene, not specified as recurrent: Secondary | ICD-10-CM

## 2021-03-03 NOTE — Progress Notes (Addendum)
Patient ID: Joseph Glimpse., male   DOB: [birth year], 75 y.o.   MRN: [id number]   Subjective:    Patient ID: Joseph Glimpse., male    DOB: October 17, 1945, 75 y.o.   MRN: [id number]  This visit occurred during the SARS-CoV-2 public health emergency.  Safety protocols were in place, including screening questions prior to the visit, additional usage of staff PPE, and extensive cleaning of exam room while observing appropriate contact time as indicated for disinfecting solutions.   Patient here for his physical exam.   Chief Complaint  Patient presents with   Annual Exam   .   HPI Recently evaluated for increased palpitations.  Saw cardiology had holter and echo - normal LV function with trivial AR and PR.  Mild MR and TR.  EF 50%.  Due to f/u with cardiology 03/06/21.  Saw Dr Tami Ribas.  Planning for sleep study.  Reports taking increased dose of vitamin D - by mistake.  Off now.  States palpitations are better.  No chest pain or sob reported.  No acid reflux or abdominal pain reported.  Has noticed bulge - lower abdomen.  (Noticed more over the last 3-4 weeks).  No pain.  Bowels stable.     Past Medical History:  Diagnosis Date   Asthma    Bladder outlet obstruction    Crohn's disease ([place])    appendix, s/p appendectomy   GERD (gastroesophageal reflux disease)    schatzki ring   Hypercholesterolemia    Restless leg syndrome    Seizure disorder [facility])    age 43, previously on phenobarbital   Skin cancer, basal cell    Sleep apnea    Past Surgical History:  Procedure Laterality Date   APPENDECTOMY  1992   lysis of adhesions (bowel blockage)   inguinal hernia surgery  1999   left   KNEE ARTHROSCOPY  [place]   TONSILLECTOMY AND ADENOIDECTOMY  1956   Family History  Problem Relation Age of Onset   Heart disease Father    Congestive Heart Failure Mother    Colon cancer Neg Hx    Prostate cancer Neg Hx    Social History   Socioeconomic History    Marital status: Married    Spouse name: Not on file   Number of children: 0   Years of education: Not on file   Highest education level: Not on file  Occupational History   Not on file  Tobacco Use   Smoking status: Never   Smokeless tobacco: Never  Vaping Use   Vaping Use: Never used  Substance and Sexual Activity   Alcohol use: No    Alcohol/week: 0.0 standard drinks   Drug use: No   Sexual activity: Not Currently  Other Topics Concern   Not on file  Social History Narrative   Not on file   Social Determinants of Health   Financial Resource Strain: Low Risk    Difficulty of Paying Living Expenses: Not hard at all  Food Insecurity: No Food Insecurity   Worried About Charity fundraiser in the Last Year: Never true   Ran Out of Food in the Last Year: Never true  Transportation Needs: No Transportation Needs   Lack of Transportation (Medical): No   Lack of Transportation (Non-Medical): No  Physical Activity: Sufficiently Active   Days of Exercise per Week: 5 days   Minutes of Exercise per Session: 30 min  Stress: No Stress  Concern Present   Feeling of Stress : Not at all  Social Connections: Unknown   Frequency of Communication with Friends and Family: More than three times a week   Frequency of Social Gatherings with Friends and Family: More than three times a week   Attends Religious Services: Not on Electrical engineer or Organizations: Not on file   Attends Archivist Meetings: Not on file   Marital Status: Married     Review of Systems  Constitutional:  Negative for appetite change and unexpected weight change.  HENT:  Negative for congestion, sinus pressure and sore throat.   Eyes:  Negative for pain and visual disturbance.  Respiratory:  Negative for cough, chest tightness and shortness of breath.   Cardiovascular:  Negative for chest pain and palpitations.  Gastrointestinal:  Negative for abdominal pain, diarrhea, nausea and vomiting.   Genitourinary:  Negative for difficulty urinating and dysuria.  Musculoskeletal:  Negative for joint swelling and myalgias.  Skin:  Negative for color change and rash.  Neurological:  Negative for dizziness, light-headedness and headaches.  Hematological:  Negative for adenopathy. Does not bruise/bleed easily.  Psychiatric/Behavioral:  Negative for agitation and dysphoric mood.       Objective:     BP 118/70   Pulse 62   Temp 97.6 F (36.4 C)   Resp 16   Ht 5\' 9"  (1.753 m)   Wt 228 lb (103.4 kg)   SpO2 98%   BMI 33.67 kg/m  Wt Readings from Last 3 Encounters:  03/03/21 228 lb (103.4 kg)  02/01/21 228 lb 6.4 oz (103.6 kg)  06/30/20 220 lb (99.8 kg)    Physical Exam Constitutional:      General: He is not in acute distress.    Appearance: Normal appearance. He is well-developed.  HENT:     Head: Normocephalic and atraumatic.     Right Ear: External ear normal.     Left Ear: External ear normal.  Eyes:     General: No scleral icterus.       Right eye: No discharge.        Left eye: No discharge.     Conjunctiva/sclera: Conjunctivae normal.  Neck:     Thyroid: No thyromegaly.  Cardiovascular:     Rate and Rhythm: Normal rate and regular rhythm.  Pulmonary:     Effort: No respiratory distress.     Breath sounds: Normal breath sounds. No wheezing.  Abdominal:     General: Bowel sounds are normal.     Palpations: Abdomen is soft.     Tenderness: There is no abdominal tenderness.     Comments: Fullness - right lower abdomen -no pain.  Appears to be c/w hernia.    Musculoskeletal:        General: No swelling or tenderness.     Cervical back: Neck supple. No tenderness.  Lymphadenopathy:     Cervical: No cervical adenopathy.  Skin:    Findings: No erythema or rash.  Neurological:     Mental Status: He is alert and oriented to person, place, and time.  Psychiatric:        Mood and Affect: Mood normal.        Behavior: Behavior normal.     Outpatient Encounter  Medications as of 03/03/2021  Medication Sig   albuterol (PROVENTIL HFA;VENTOLIN HFA) 108 (90 Base) MCG/ACT inhaler Inhale 2 puffs into the lungs every 6 (six) hours as needed for wheezing or shortness of  breath.   BREO ELLIPTA 100-25 MCG/INH AEPB INHALE 1 PUFF BY MOUTH EVERY DAY   CALCIUM CITRATE PO Take 1 tablet by mouth daily.   Cholecalciferol (VITAMIN D-3) 1000 UNITS CAPS Take 1 capsule by mouth daily.   Coenzyme Q10 (COQ10) 100 MG CAPS Take 100 mg by mouth daily.   finasteride (PROSCAR) 5 MG tablet TAKE 1 TABLET BY MOUTH EVERY DAY   Magnesium 250 MG TABS Two tablets q day   Multiple Vitamins-Minerals (CENTRUM SILVER 50+MEN PO) Take 1 tablet by mouth daily.   omeprazole (PRILOSEC) 20 MG capsule TAKE 1 CAPSULE (20 MG TOTAL) BY MOUTH 2 (TWO) TIMES DAILY BEFORE A MEAL.   pramipexole (MIRAPEX) 0.5 MG tablet TAKE 1 TABLET BY MOUTH EVERYDAY AT BEDTIME   pravastatin (PRAVACHOL) 20 MG tablet TAKE 1 TABLET BY MOUTH EVERY DAY   No facility-administered encounter medications on file as of 03/03/2021.     Lab Results  Component Value Date   WBC 4.9 02/01/2021   HGB 15.1 02/01/2021   HCT 45.3 02/01/2021   PLT 205.0 02/01/2021   GLUCOSE 86 02/01/2021   CHOL 158 08/22/2020   TRIG 75.0 08/22/2020   HDL 48.60 08/22/2020   LDLDIRECT 154.7 05/22/2013   LDLCALC 95 08/22/2020   ALT 14 02/01/2021   AST 43 (H) 02/01/2021   NA 140 02/01/2021   K 4.2 02/01/2021   CL 102 02/01/2021   CREATININE 1.36 02/01/2021   BUN 17 02/01/2021   CO2 32 02/01/2021   TSH 2.15 02/01/2021   PSA 1.07 08/22/2020   INR 1.00 11/17/2016    MR BRAIN W WO CONTRAST  Result Date: 12/01/2016 CLINICAL DATA:  History of seizure.  Head seizures as a youth. EXAM: MRI HEAD WITHOUT AND WITH CONTRAST TECHNIQUE: Multiplanar, multiecho pulse sequences of the brain and surrounding structures were obtained without and with intravenous contrast. CONTRAST:  93mL MULTIHANCE GADOBENATE DIMEGLUMINE 529 MG/ML IV SOLN COMPARISON:  Head CT  11/17/2016 FINDINGS: Brain: No explanation for seizure. No cortical scarring or mass. Accounting for remnant cysts, the hippocampi are symmetric and unremarkable. Normal brain volume. Very mild hyperintensity in the cerebral white matter, usually attributed to remote microvascular insults. Lacunar infarcts described on previous head CT are instead dilated perivascular spaces. No specific demyelinating pattern. Vascular: Negative. Skull and upper cervical spine: Negative for marrow lesion Sinuses/Orbits: Focal right maxillary sinusitis with T2 hypointense fluid level that may be proteinaceous. Bilateral cataract resection. IMPRESSION: 1. No explanation for seizure.  Unremarkable brain MRI. 2. Structures described is lacunar infarcts on head CT 11/17/2016 are instead dilated perivascular spaces. 3. Right maxillary sinusitis. Electronically Signed   By: Monte Fantasia M.D.   On: 12/01/2016 15:19       Assessment & Plan:   Problem List Items Addressed This Visit     Abnormal liver function test    Continue diet and exercise.  One liver test slightly elevated - stable.  Remainder of liver panel wnl.  Follow.       Asthma    Continue breo.  Breathing stable.       GERD (gastroesophageal reflux disease)    No upper symptoms reported.  On omeprazole.        Healthcare maintenance    Physical today 03/03/21.  Follow psa. (08/22/20 - 1.07).  Colonoscopy 2013.       Hernia, inguinal    Hernia.  No pain.  Discussed.  Wants to monitor.        Hypercholesterolemia    Continue  statin medication.  Low cholesterol diet and exercise.  Follow lipid panel and liver function tests.        Palpitations    Previously reported increased palpitations as outlined.  Cardiology w/up as outlined.  Has f/u planned with cardiology 11/7/2.  Doing better.  No increased palpitations.  Follow.       Restless leg syndrome    Doing well on mirapex.  Follow.        Sleep apnea    Planning for f/u sleep study.        Other Visit Diagnoses     Routine general medical examination at a health care facility    -  Primary   Need for immunization against influenza       Relevant Orders   Flu Vaccine QUAD High Dose(Fluad) (Completed)        Einar Pheasant, MD

## 2021-03-05 DIAGNOSIS — G4733 Obstructive sleep apnea (adult) (pediatric): Secondary | ICD-10-CM | POA: Diagnosis not present

## 2021-03-06 DIAGNOSIS — I493 Ventricular premature depolarization: Secondary | ICD-10-CM | POA: Diagnosis not present

## 2021-03-06 DIAGNOSIS — E7800 Pure hypercholesterolemia, unspecified: Secondary | ICD-10-CM | POA: Diagnosis not present

## 2021-03-12 ENCOUNTER — Telehealth: Payer: Self-pay | Admitting: Internal Medicine

## 2021-03-12 ENCOUNTER — Encounter: Payer: Self-pay | Admitting: Internal Medicine

## 2021-03-12 DIAGNOSIS — K4090 Unilateral inguinal hernia, without obstruction or gangrene, not specified as recurrent: Secondary | ICD-10-CM | POA: Insufficient documentation

## 2021-03-12 NOTE — Assessment & Plan Note (Signed)
Planning for f/u sleep study.

## 2021-03-12 NOTE — Assessment & Plan Note (Signed)
Previously reported increased palpitations as outlined.  Cardiology w/up as outlined.  Has f/u planned with cardiology 11/7/2.  Doing better.  No increased palpitations.  Follow.

## 2021-03-12 NOTE — Assessment & Plan Note (Addendum)
Physical today 03/03/21.  Follow psa. (08/22/20 - 1.07).  Colonoscopy 2013.

## 2021-03-12 NOTE — Assessment & Plan Note (Signed)
Doing well on mirapex.  Follow.   

## 2021-03-12 NOTE — Assessment & Plan Note (Signed)
Continue breo.  Breathing stable.

## 2021-03-12 NOTE — Telephone Encounter (Signed)
My chart message sent to see if ok with referral for colonoscopy.

## 2021-03-12 NOTE — Assessment & Plan Note (Signed)
Continue statin medication.  Low cholesterol diet and exercise.  Follow lipid panel and liver function tests.   

## 2021-03-12 NOTE — Assessment & Plan Note (Signed)
Hernia.  No pain.  Discussed.  Wants to monitor.

## 2021-03-12 NOTE — Assessment & Plan Note (Signed)
Continue diet and exercise.  One liver test slightly elevated - stable.  Remainder of liver panel wnl.  Follow.

## 2021-03-12 NOTE — Assessment & Plan Note (Signed)
No upper symptoms reported.  On omeprazole.  

## 2021-04-12 ENCOUNTER — Other Ambulatory Visit: Payer: Self-pay | Admitting: Internal Medicine

## 2021-04-13 ENCOUNTER — Encounter: Payer: Self-pay | Admitting: Internal Medicine

## 2021-04-13 ENCOUNTER — Other Ambulatory Visit: Payer: Self-pay

## 2021-04-13 MED ORDER — FINASTERIDE 5 MG PO TABS: 5.0000 mg | ORAL_TABLET | Freq: Every day | ORAL | 3 refills | Status: DC

## 2021-04-13 MED ORDER — PRAVASTATIN SODIUM 20 MG PO TABS: 20.0000 mg | ORAL_TABLET | Freq: Every day | ORAL | 3 refills | Status: DC

## 2021-04-13 MED ORDER — OMEPRAZOLE 20 MG PO CPDR: 20.0000 mg | DELAYED_RELEASE_CAPSULE | Freq: Two times a day (BID) | ORAL | 3 refills | Status: DC

## 2021-06-02 ENCOUNTER — Telehealth: Payer: Self-pay | Admitting: Internal Medicine

## 2021-06-02 DIAGNOSIS — E7800 Pure hypercholesterolemia, unspecified: Secondary | ICD-10-CM

## 2021-06-02 NOTE — Telephone Encounter (Signed)
Labs ordered.

## 2021-06-02 NOTE — Addendum Note (Signed)
Addended by: Lars Masson on: 06/02/2021 01:17 PM   Modules accepted: Orders

## 2021-06-02 NOTE — Telephone Encounter (Signed)
See note.  Needs lab orders.  °

## 2021-06-02 NOTE — Telephone Encounter (Signed)
Pt has a lab appt 06/05/2021, there are no orders in.

## 2021-06-05 ENCOUNTER — Other Ambulatory Visit (INDEPENDENT_AMBULATORY_CARE_PROVIDER_SITE_OTHER): Payer: Medicare Other

## 2021-06-05 ENCOUNTER — Other Ambulatory Visit: Payer: Self-pay

## 2021-06-05 DIAGNOSIS — E7800 Pure hypercholesterolemia, unspecified: Secondary | ICD-10-CM | POA: Diagnosis not present

## 2021-06-05 LAB — BASIC METABOLIC PANEL
BUN: 18 mg/dL (ref 6–23)
CO2: 30 mEq/L (ref 19–32)
Calcium: 9.2 mg/dL (ref 8.4–10.5)
Chloride: 102 mEq/L (ref 96–112)
Creatinine, Ser: 1.31 mg/dL (ref 0.40–1.50)
GFR: 53.67 mL/min — ABNORMAL LOW (ref >60.00)
Glucose, Bld: 96 mg/dL (ref 70–99)
Potassium: 4.5 mEq/L (ref 3.5–5.1)
Sodium: 140 mEq/L (ref 135–145)

## 2021-06-05 LAB — HEPATIC FUNCTION PANEL
ALT: 14 U/L (ref 0–53)
AST: 45 U/L — ABNORMAL HIGH (ref 0–37)
Albumin: 4 g/dL (ref 3.5–5.2)
Alkaline Phosphatase: 55 U/L (ref 39–117)
Bilirubin, Direct: 0.2 mg/dL (ref 0.0–0.3)
Total Bilirubin: 1 mg/dL (ref 0.2–1.2)
Total Protein: 6.2 g/dL (ref 6.0–8.3)

## 2021-06-05 LAB — LIPID PANEL
Cholesterol: 173 mg/dL (ref 0–200)
HDL: 45.5 mg/dL (ref >39.00)
LDL Cholesterol: 109 mg/dL — ABNORMAL HIGH (ref 0–99)
NonHDL: 127.79
Total CHOL/HDL Ratio: 4
Triglycerides: 95 mg/dL (ref 0.0–149.0)
VLDL: 19 mg/dL (ref 0.0–40.0)

## 2021-06-07 ENCOUNTER — Other Ambulatory Visit: Payer: Self-pay

## 2021-06-07 MED ORDER — PRAVASTATIN SODIUM 40 MG PO TABS: 40.0000 mg | ORAL_TABLET | Freq: Every day | ORAL | 1 refills | Status: DC

## 2021-07-03 ENCOUNTER — Ambulatory Visit (INDEPENDENT_AMBULATORY_CARE_PROVIDER_SITE_OTHER): Payer: Medicare Other

## 2021-07-03 VITALS — Ht 69.0 in | Wt 228.0 lb

## 2021-07-03 DIAGNOSIS — Z0000 Encounter for general adult medical examination without abnormal findings: Secondary | ICD-10-CM | POA: Diagnosis not present

## 2021-07-03 NOTE — Progress Notes (Signed)
Subjective:   Joseph Good. is a 76 y.o. male who presents for Medicare Annual/Subsequent preventive examination.  Review of Systems    No ROS.  Medicare Wellness Virtual Visit.  Visual/audio telehealth visit, UTA vital signs.   See social history for additional risk factors.   Cardiac Risk Factors include: advanced age (>55men, >81 women);male gender     Objective:    Today's Vitals   07/03/21 0903  Weight: 228 lb (103.4 kg)  Height: 5\' 9"  (1.753 m)   Body mass index is 33.67 kg/m.  Advanced Directives 07/03/2021 06/30/2020 06/30/2019 10/07/2017 11/17/2016 09/13/2016 09/14/2015  Does Patient Have a Medical Advance Directive? Yes No No No No No No  Type of Advance Directive Living will - - - - - -  Does patient want to make changes to medical advance directive? No - Patient declined No - Patient declined - - - - -  Would patient like information on creating a medical advance directive? - - No - Patient declined Yes (MAU/Ambulatory/Procedural Areas - Information given) No - Patient declined No - Patient declined No - patient declined information    Current Medications (verified) Outpatient Encounter Medications as of 07/03/2021  Medication Sig   albuterol (PROVENTIL HFA;VENTOLIN HFA) 108 (90 Base) MCG/ACT inhaler Inhale 2 puffs into the lungs every 6 (six) hours as needed for wheezing or shortness of breath.   BREO ELLIPTA 100-25 MCG/INH AEPB INHALE 1 PUFF BY MOUTH EVERY DAY   CALCIUM CITRATE PO Take 1 tablet by mouth daily.   Cholecalciferol (VITAMIN D-3) 1000 UNITS CAPS Take 1 capsule by mouth daily.   Coenzyme Q10 (COQ10) 100 MG CAPS Take 100 mg by mouth daily.   finasteride (PROSCAR) 5 MG tablet Take 1 tablet (5 mg total) by mouth daily.   Magnesium 250 MG TABS Two tablets q day   Multiple Vitamins-Minerals (CENTRUM SILVER 50+MEN PO) Take 1 tablet by mouth daily.   omeprazole (PRILOSEC) 20 MG capsule Take 1 capsule (20 mg total) by mouth 2 (two) times daily before a meal.    pramipexole (MIRAPEX) 0.5 MG tablet TAKE 1 TABLET BY MOUTH EVERYDAY AT BEDTIME   pravastatin (PRAVACHOL) 40 MG tablet Take 1 tablet (40 mg total) by mouth daily.   No facility-administered encounter medications on file as of 07/03/2021.    Allergies (verified) Seldane [terfenadine]   History: Past Medical History:  Diagnosis Date   Asthma    Bladder outlet obstruction    Crohn's disease ([facility])    appendix, s/p appendectomy   GERD (gastroesophageal reflux disease)    schatzki ring   Hypercholesterolemia    Restless leg syndrome    Seizure disorder [facility])    age 28, previously on phenobarbital   Skin cancer, basal cell    Sleep apnea    Past Surgical History:  Procedure Laterality Date   APPENDECTOMY  1992   lysis of adhesions (bowel blockage)   inguinal hernia surgery  1999   left   KNEE ARTHROSCOPY  1982   NASAL POLYP SURGERY  1963   TONSILLECTOMY AND ADENOIDECTOMY  1956   Family History  Problem Relation Age of Onset   Heart disease Father    Congestive Heart Failure Mother    Colon cancer Neg Hx    Prostate cancer Neg Hx    Social History   Socioeconomic History   Marital status: Married    Spouse name: Not on file   Number of children: 0   Years of education: Not  on file   Highest education level: Not on file  Occupational History   Not on file  Tobacco Use   Smoking status: Never   Smokeless tobacco: Never  Vaping Use   Vaping Use: Never used  Substance and Sexual Activity   Alcohol use: No    Alcohol/week: 0.0 standard drinks   Drug use: No   Sexual activity: Not Currently  Other Topics Concern   Not on file  Social History Narrative   Not on file   Social Determinants of Health   Financial Resource Strain: Low Risk    Difficulty of Paying Living Expenses: Not hard at all  Food Insecurity: No Food Insecurity   Worried About Programme researcher, broadcasting/film/video in the Last Year: Never true   Ran Out of Food in the Last Year: Never true  Transportation Needs:  No Transportation Needs   Lack of Transportation (Medical): No   Lack of Transportation (Non-Medical): No  Physical Activity: Sufficiently Active   Days of Exercise per Week: 5 days   Minutes of Exercise per Session: 30 min  Stress: No Stress Concern Present   Feeling of Stress : Not at all  Social Connections: Unknown   Frequency of Communication with Friends and Family: More than three times a week   Frequency of Social Gatherings with Friends and Family: More than three times a week   Attends Religious Services: Not on Scientist, clinical (histocompatibility and immunogenetics) or Organizations: Not on file   Attends Banker Meetings: Not on file   Marital Status: Married    Tobacco Counseling Counseling given: Not Answered   Clinical Intake:  Pre-visit preparation completed: Yes        Diabetes: No  How often do you need to have someone help you when you read instructions, pamphlets, or other written materials from your doctor or pharmacy?: 1 - Never    Interpreter Needed?: No      Activities of Daily Living In your present state of health, do you have any difficulty performing the following activities: 07/03/2021  Hearing? Y  Comment Hearing aids  Vision? N  Difficulty concentrating or making decisions? N  Walking or climbing stairs? N  Dressing or bathing? N  Doing errands, shopping? N  Preparing Food and eating ? N  Using the Toilet? N  In the past six months, have you accidently leaked urine? N  Do you have problems with loss of bowel control? N  Managing your Medications? N  Managing your Finances? N  Housekeeping or managing your Housekeeping? N  Some recent data might be hidden    Patient Care Team: Dale Sugar Hill, MD as PCP - General (Internal Medicine)  Indicate any recent Medical Services you may have received from other than [facility] providers in the past year (date may be approximate).     Assessment:   This is a routine wellness examination for  Joseph Good.  Virtual Visit via Telephone Note  I connected with  Joseph Good. on 07/03/21 at  9:00 AM EST by telephone and verified that I am speaking with the correct person using two identifiers.  Persons participating in the virtual visit: patient/Nurse Health Advisor   I discussed the limitations, risks, security and privacy concerns of performing an evaluation and management service by telephone and the availability of in person appointments. The patient expressed understanding and agreed to proceed.  Interactive audio and video telecommunications were attempted between this nurse and patient, however failed,  due to patient having technical difficulties OR patient did not have access to video capability.  We continued and completed visit with audio only.  Some vital signs may be absent or patient reported.   Hearing/Vision screen Hearing Screening - Comments:: Hearing aids, bilateral Vision Screening - Comments:: Followed by [facility]  Wears corrective lenses when reading  Cataract extraction, bilateral  They have regular follow up with the ophthalmologist  Dietary issues and exercise activities discussed: Current Exercise Habits: Home exercise routine, Type of exercise: treadmill, Time (Minutes): 30, Frequency (Times/Week): 5, Weekly Exercise (Minutes/Week): 150, Intensity: Mild   Goals Addressed             This Visit's Progress    Follow up with Primary Care Provider       As needed.       Depression Screen PHQ 2/9 Scores 07/03/2021 06/30/2020 06/30/2019 02/12/2019 10/07/2017 09/13/2016 02/03/2016  PHQ - 2 Score 0 0 0 0 0 0 0  PHQ- 9 Score - - - - - 0 -    Fall Risk Fall Risk  07/03/2021 06/30/2020 02/24/2020 06/30/2019 02/12/2019  Falls in the past year? 0 0 0 0 0  Comment - - - - -  Number falls in past yr: 0 0 0 - -  Injury with Fall? - 0 0 - -  Follow up Falls evaluation completed Falls evaluation completed Falls evaluation completed Falls evaluation  completed Falls evaluation completed    FALL RISK PREVENTION PERTAINING TO THE HOME: Home free of loose throw rugs in walkways, pet beds, electrical cords, etc? Yes  Adequate lighting in your home to reduce risk of falls? Yes   ASSISTIVE DEVICES UTILIZED TO PREVENT FALLS: Use of a cane, walker or w/c? No   TIMED UP AND GO: Was the test performed? No .   Cognitive Function: Patient is alert and oriented x3. MMSE - Mini Mental State Exam 10/07/2017 09/13/2016 09/14/2015  Orientation to time 5 5 5   Orientation to Place 5 5 5   Registration 3 3 3   Attention/ Calculation 5 5 5   Recall 3 3 3   Language- name 2 objects 2 2 2   Language- repeat 1 1 1   Language- follow 3 step command 3 3 3   Language- read & follow direction 1 1 1   Write a sentence 1 1 1   Copy design 1 1 1   Total score 30 30 30      6CIT Screen 06/30/2019  What Year? 0 points  What month? 0 points  What time? 0 points  Count back from 20 0 points  Months in reverse 0 points   Immunizations Immunization History  Administered Date(s) Administered   Fluad Quad(high Dose 65+) 02/03/2019, 03/30/2019, 03/03/2021   Influenza Split 03/02/2013   Influenza, High Dose Seasonal PF 02/03/2016, 02/15/2017, 02/25/2018   Influenza,inj,Quad PF,6+ Mos 01/22/2014, 01/31/2015   Influenza-Unspecified 02/25/2018   Janssen (J&J) SARS-COV-2 Vaccination 07/12/2019, 04/08/2020   Moderna Covid-19 Vaccine Bivalent Booster 110yrs & up 03/14/2021   Pneumococcal Conjugate-13 07/22/2013   Pneumococcal Polysaccharide-23 02/11/2015   Tdap 12/30/2015   Shingrix Completed?: No.    Education has been provided regarding the importance of this vaccine. Patient has been advised to call insurance company to determine out of pocket expense if they have not yet received this vaccine. Advised may also receive vaccine at local pharmacy or Health Dept. Verbalized acceptance and understanding.  Screening Tests Health Maintenance  Topic Date Due   COLONOSCOPY  (Pts 45-36yrs Insurance coverage will need to  be confirmed)  12/29/2020   Zoster Vaccines- Shingrix (1 of 2) 10/03/2021 (Originally 01/16/1997)   TETANUS/TDAP  12/29/2025   Pneumonia Vaccine 22+ Years old  Completed   INFLUENZA VACCINE  Completed   COVID-19 Vaccine  Completed   Hepatitis C Screening  Completed   HPV VACCINES  Aged Out   Health Maintenance Health Maintenance Due  Topic Date Due   COLONOSCOPY (Pts 45-7yrs Insurance coverage will need to be confirmed)  12/29/2020   Colonoscopy- deferred for further discussion with pcp per patient preference.   Lung Cancer Screening: (Low Dose CT Chest recommended if Age 34-80 years, 30 pack-year currently smoking OR have quit w/in 15years.) does not qualify.   Vision Screening: Recommended annual ophthalmology exams for early detection of glaucoma and other disorders of the eye.  Dental Screening: Recommended annual dental exams for proper oral hygiene  Community Resource Referral / Chronic Care Management: CRR required this visit?  No   CCM required this visit?  No      Plan:   Keep all routine maintenance appointments.   I have personally reviewed and noted the following in the patient's chart:   Medical and social history Use of alcohol, tobacco or illicit drugs  Current medications and supplements including opioid prescriptions. Patient is not currently taking opioid prescriptions. Functional ability and status Nutritional status Physical activity Advanced directives List of other physicians Hospitalizations, surgeries, and ER visits in previous 12 months Vitals Screenings to include cognitive, depression, and falls Referrals and appointments  In addition, I have reviewed and discussed with patient certain preventive protocols, quality metrics, and best practice recommendations. A written personalized care plan for preventive services as well as general preventive health recommendations were provided to patient.      Ashok Pall, LPN   05/05/1094

## 2021-07-03 NOTE — Patient Instructions (Addendum)
Joseph Good , Thank you for taking time to come for your Medicare Wellness Visit. I appreciate your ongoing commitment to your health goals. Please review the following plan we discussed and let me know if I can assist you in the future.   These are the goals we discussed:  Goals      Follow up with Primary Care Provider     As needed.        This is a list of the screening recommended for you and due dates:  Health Maintenance  Topic Date Due   Colon Cancer Screening  12/29/2020   Zoster (Shingles) Vaccine (1 of 2) 10/03/2021*   Tetanus Vaccine  12/29/2025   Pneumonia Vaccine  Completed   Flu Shot  Completed   COVID-19 Vaccine  Completed   Hepatitis C Screening: USPSTF Recommendation to screen - Ages 18-79 yo.  Completed   HPV Vaccine  Aged Out  *Topic was postponed. The date shown is not the original due date.    Advanced directives: End of life planning; Advance aging; Advanced directives discussed.  Copy of current HCPOA/Living Will requested.    Conditions/risks identified: none new  Follow up in one year for your annual wellness visit.   Preventive Care 45 Years and Older, Male Preventive care refers to lifestyle choices and visits with your health care provider that can promote health and wellness. What does preventive care include? A yearly physical exam. This is also called an annual well check. Dental exams once or twice a year. Routine eye exams. Ask your health care provider how often you should have your eyes checked. Personal lifestyle choices, including: Daily care of your teeth and gums. Regular physical activity. Eating a healthy diet. Avoiding tobacco and drug use. Limiting alcohol use. Practicing safe sex. Taking low doses of aspirin every day. Taking vitamin and mineral supplements as recommended by your health care provider. What happens during an annual well check? The services and screenings done by your health care provider during your annual  well check will depend on your age, overall health, lifestyle risk factors, and family history of disease. Counseling  Your health care provider may ask you questions about your: Alcohol use. Tobacco use. Drug use. Emotional well-being. Home and relationship well-being. Sexual activity. Eating habits. History of falls. Memory and ability to understand (cognition). Work and work Astronomer. Screening  You may have the following tests or measurements: Height, weight, and BMI. Blood pressure. Lipid and cholesterol levels. These may be checked every 5 years, or more frequently if you are over 65 years old. Skin check. Lung cancer screening. You may have this screening every year starting at age 26 if you have a 30-pack-year history of smoking and currently smoke or have quit within the past 15 years. Fecal occult blood test (FOBT) of the stool. You may have this test every year starting at age 22. Flexible sigmoidoscopy or colonoscopy. You may have a sigmoidoscopy every 5 years or a colonoscopy every 10 years starting at age 61. Prostate cancer screening. Recommendations will vary depending on your family history and other risks. Hepatitis C blood test. Hepatitis B blood test. Sexually transmitted disease (STD) testing. Diabetes screening. This is done by checking your blood sugar (glucose) after you have not eaten for a while (fasting). You may have this done every 1-3 years. Abdominal aortic aneurysm (AAA) screening. You may need this if you are a current or former smoker. Osteoporosis. You may be screened starting at age 107  if you are at high risk. Talk with your health care provider about your test results, treatment options, and if necessary, the need for more tests. Vaccines  Your health care provider may recommend certain vaccines, such as: Influenza vaccine. This is recommended every year. Tetanus, diphtheria, and acellular pertussis (Tdap, Td) vaccine. You may need a Td booster  every 10 years. Zoster vaccine. You may need this after age 75. Pneumococcal 13-valent conjugate (PCV13) vaccine. One dose is recommended after age 57. Pneumococcal polysaccharide (PPSV23) vaccine. One dose is recommended after age 45. Talk to your health care provider about which screenings and vaccines you need and how often you need them. This information is not intended to replace advice given to you by your health care provider. Make sure you discuss any questions you have with your health care provider. Document Released: 05/13/2015 Document Revised: 01/04/2016 Document Reviewed: 02/15/2015 Elsevier Interactive Patient Education  2017 [employer].  Fall Prevention in the Home Falls can cause injuries. They can happen to people of all ages. There are many things you can do to make your home safe and to help prevent falls. What can I do on the outside of my home? Regularly fix the edges of walkways and driveways and fix any cracks. Remove anything that might make you trip as you walk through a door, such as a raised step or threshold. Trim any bushes or trees on the path to your home. Use bright outdoor lighting. Clear any walking paths of anything that might make someone trip, such as rocks or tools. Regularly check to see if handrails are loose or broken. Make sure that both sides of any steps have handrails. Any raised decks and porches should have guardrails on the edges. Have any leaves, snow, or ice cleared regularly. Use sand or salt on walking paths during winter. Clean up any spills in your garage right away. This includes oil or grease spills. What can I do in the bathroom? Use night lights. Install grab bars by the toilet and in the tub and shower. Do not use towel bars as grab bars. Use non-skid mats or decals in the tub or shower. If you need to sit down in the shower, use a plastic, non-slip stool. Keep the floor dry. Clean up any water that spills on the floor as soon  as it happens. Remove soap buildup in the tub or shower regularly. Attach bath mats securely with double-sided non-slip rug tape. Do not have throw rugs and other things on the floor that can make you trip. What can I do in the bedroom? Use night lights. Make sure that you have a light by your bed that is easy to reach. Do not use any sheets or blankets that are too big for your bed. They should not hang down onto the floor. Have a firm chair that has side arms. You can use this for support while you get dressed. Do not have throw rugs and other things on the floor that can make you trip. What can I do in the kitchen? Clean up any spills right away. Avoid walking on wet floors. Keep items that you use a lot in easy-to-reach places. If you need to reach something above you, use a strong step stool that has a grab bar. Keep electrical cords out of the way. Do not use floor polish or wax that makes floors slippery. If you must use wax, use non-skid floor wax. Do not have throw rugs and other things  on the floor that can make you trip. What can I do with my stairs? Do not leave any items on the stairs. Make sure that there are handrails on both sides of the stairs and use them. Fix handrails that are broken or loose. Make sure that handrails are as long as the stairways. Check any carpeting to make sure that it is firmly attached to the stairs. Fix any carpet that is loose or worn. Avoid having throw rugs at the top or bottom of the stairs. If you do have throw rugs, attach them to the floor with carpet tape. Make sure that you have a light switch at the top of the stairs and the bottom of the stairs. If you do not have them, ask someone to add them for you. What else can I do to help prevent falls? Wear shoes that: Do not have high heels. Have rubber bottoms. Are comfortable and fit you well. Are closed at the toe. Do not wear sandals. If you use a stepladder: Make sure that it is fully  opened. Do not climb a closed stepladder. Make sure that both sides of the stepladder are locked into place. Ask someone to hold it for you, if possible. Clearly mark and make sure that you can see: Any grab bars or handrails. First and last steps. Where the edge of each step is. Use tools that help you move around (mobility aids) if they are needed. These include: Canes. Walkers. Scooters. Crutches. Turn on the lights when you go into a dark area. Replace any light bulbs as soon as they burn out. Set up your furniture so you have a clear path. Avoid moving your furniture around. If any of your floors are uneven, fix them. If there are any pets around you, be aware of where they are. Review your medicines with your doctor. Some medicines can make you feel dizzy. This can increase your chance of falling. Ask your doctor what other things that you can do to help prevent falls. This information is not intended to replace advice given to you by your health care provider. Make sure you discuss any questions you have with your health care provider. Document Released: 02/10/2009 Document Revised: 09/22/2015 Document Reviewed: 05/21/2014 Elsevier Interactive Patient Education  2017 [employer].

## 2021-07-19 ENCOUNTER — Other Ambulatory Visit: Payer: Self-pay | Admitting: Internal Medicine

## 2021-07-19 ENCOUNTER — Encounter: Payer: Self-pay | Admitting: Internal Medicine

## 2021-07-20 ENCOUNTER — Other Ambulatory Visit: Payer: Self-pay

## 2021-07-20 MED ORDER — PRAMIPEXOLE DIHYDROCHLORIDE 0.5 MG PO TABS: ORAL_TABLET | ORAL | 1 refills | Status: DC

## 2021-09-07 DIAGNOSIS — Z8669 Personal history of other diseases of the nervous system and sense organs: Secondary | ICD-10-CM | POA: Diagnosis not present

## 2021-09-07 DIAGNOSIS — E7800 Pure hypercholesterolemia, unspecified: Secondary | ICD-10-CM | POA: Diagnosis not present

## 2021-09-07 DIAGNOSIS — I493 Ventricular premature depolarization: Secondary | ICD-10-CM | POA: Diagnosis not present

## 2021-09-11 ENCOUNTER — Encounter: Payer: Self-pay | Admitting: Internal Medicine

## 2021-09-11 ENCOUNTER — Ambulatory Visit (INDEPENDENT_AMBULATORY_CARE_PROVIDER_SITE_OTHER): Payer: Medicare Other

## 2021-09-11 ENCOUNTER — Ambulatory Visit (INDEPENDENT_AMBULATORY_CARE_PROVIDER_SITE_OTHER): Payer: Medicare Other | Admitting: Internal Medicine

## 2021-09-11 VITALS — BP 124/80 | HR 69 | Temp 98.0°F | Resp 14 | Ht 69.0 in | Wt 230.4 lb

## 2021-09-11 DIAGNOSIS — E7800 Pure hypercholesterolemia, unspecified: Secondary | ICD-10-CM

## 2021-09-11 DIAGNOSIS — G2581 Restless legs syndrome: Secondary | ICD-10-CM | POA: Diagnosis not present

## 2021-09-11 DIAGNOSIS — K219 Gastro-esophageal reflux disease without esophagitis: Secondary | ICD-10-CM

## 2021-09-11 DIAGNOSIS — G4730 Sleep apnea, unspecified: Secondary | ICD-10-CM | POA: Diagnosis not present

## 2021-09-11 DIAGNOSIS — Z125 Encounter for screening for malignant neoplasm of prostate: Secondary | ICD-10-CM | POA: Diagnosis not present

## 2021-09-11 DIAGNOSIS — M810 Age-related osteoporosis without current pathological fracture: Secondary | ICD-10-CM

## 2021-09-11 DIAGNOSIS — R7989 Other specified abnormal findings of blood chemistry: Secondary | ICD-10-CM | POA: Diagnosis not present

## 2021-09-11 DIAGNOSIS — R059 Cough, unspecified: Secondary | ICD-10-CM

## 2021-09-11 DIAGNOSIS — J4520 Mild intermittent asthma, uncomplicated: Secondary | ICD-10-CM

## 2021-09-11 DIAGNOSIS — Z1211 Encounter for screening for malignant neoplasm of colon: Secondary | ICD-10-CM

## 2021-09-11 DIAGNOSIS — R062 Wheezing: Secondary | ICD-10-CM | POA: Diagnosis not present

## 2021-09-11 NOTE — Progress Notes (Signed)
Patient ID: Joseph Glimpse., male   DOB: 1945/05/19, 76 y.o.   MRN: [id number]   Subjective:    Patient ID: Joseph Glimpse., male    DOB: [birth year], 75 y.o.   MRN: [id number]   Patient here for a scheduled follow up.   Chief Complaint  Patient presents with   Follow-up    50mofollow up   .   HPI Here to follow up regarding his cholesterol, sleep apnea and documented asthma.  He is accompanied by his wife.  History obtained from both of them.  States he is doing relatively well.  He saw Dr KNehemiah Massed5/11/23 - routine follow up. Stable.  No changes.  He continues to use cpap.  Reports that over the last 2-3 months has noticed some intermittent wheezing.  Has been working in his garage.  Some increased saw dust.  Wife was questioning if this was contributing.  Occasional cough. Taking prilosec.  Discussed bid dosing.  Notices after eating on occasions - some cough.  No dysphagia.  Acid reflux appears to be controlled if takes prilosec.  No abdominal pain or chest pain.  Bowels moving.    Past Medical History:  Diagnosis Date   Asthma    Bladder outlet obstruction    [place], [place]/p append[place]   GERD (gastroesophageal reflux disease)    schatzki ring   Hypercholesterolemia    Restless leg syndrome    Seizure disorder ([facility]    age 76 previously on phenobarbital   Skin cancer, basal cell    Sleep apnea    Past Surgical History:  Procedure Laterality Date   APPENDECTOMY  1992   lysis of adhesions (bowel blockage)   inguinal hernia surgery  1999   left   KNEE ARTHROSCOPY  [place]  TONSILLECTOMY AND ADENOIDECTOMY  1956   Family History  Problem Relation Age of Onset   Heart disease Father    Congestive Heart Failure Mother    Colon cancer Neg Hx    Prostate cancer Neg Hx    Social History   Socioeconomic History   Marital status: Married    Spouse name: Not on file   Number of children: 0   Years of education:  Not on file   Highest education level: Not on file  Occupational History   Not on file  Tobacco Use   Smoking status: Never   Smokeless tobacco: Never  Vaping Use   Vaping Use: Never used  Substance and Sexual Activity   Alcohol use: No    Alcohol/week: 0.0 standard drinks   Drug use: No   Sexual activity: Not Currently  Other Topics Concern   Not on file  Social History Narrative   Not on file   Social Determinants of Health   Financial Resource Strain: Low Risk    Difficulty of Paying Living Expenses: Not hard at all  Food Insecurity: No Food Insecurity   Worried About RCharity fundraiserin the Last Year: Never true   Ran Out of Food in the Last Year: Never true  Transportation Needs: No Transportation Needs   Lack of Transportation (Medical): No   Lack of Transportation (Non-Medical): No  Physical Activity: Sufficiently Active   Days of Exercise per Week: 5 days   Minutes of Exercise per Session: 30 min  Stress: No Stress Concern Present   Feeling of Stress : Not at all  Social Connections: Unknown   Frequency of Communication with Friends and Family: More than three times a week   Frequency of Social Gatherings with Friends and Family: More than three times a week   Attends Religious Services: Not on Electrical engineer or Organizations: Not on file   Attends Archivist Meetings: Not on file   Marital Status: Married     Review of Systems  Constitutional:  Negative for appetite change and unexpected weight change.  HENT:  Negative for congestion and sinus pressure.   Respiratory:  Negative for chest tightness and shortness of breath.        Occasional wheezing and cough as outlined.   Cardiovascular:  Negative for chest pain, palpitations and leg swelling.  Gastrointestinal:  Negative for abdominal pain, diarrhea, nausea and vomiting.  Genitourinary:  Negative for difficulty urinating and dysuria.  Musculoskeletal:  Negative for joint  swelling and myalgias.  Skin:  Negative for color change and rash.  Neurological:  Negative for dizziness, light-headedness and headaches.  Psychiatric/Behavioral:  Negative for agitation and dysphoric mood.       Objective:     BP 124/80 (BP Location: Left Arm, Patient Position: Sitting, Cuff Size: Large)   Pulse 69   Temp 98 F (36.7 C) (Temporal)   Resp 14   Ht '5\' 9"'$  (1.753 m)   Wt 230 lb 6.4 oz (104.5 kg)   SpO2 98%   BMI 34.02 kg/m  Wt Readings from Last 3 Encounters:  09/11/21 230 lb 6.4 oz (104.5 kg)  07/03/21 228 lb (103.4 kg)  03/03/21 228 lb (103.4 kg)    Physical Exam Constitutional:      General: He is not in acute distress.    Appearance: Normal appearance. He is well-developed.  HENT:     Head: Normocephalic and atraumatic.     Right Ear: External ear normal.     Left Ear: External ear normal.  Eyes:     General: No scleral icterus.       Right eye: No discharge.        Left eye: No discharge.  Cardiovascular:     Rate and Rhythm: Normal rate and regular rhythm.  Pulmonary:     Effort: Pulmonary effort is normal. No respiratory distress.     Breath sounds: Normal breath sounds.  Abdominal:     General: Bowel sounds are normal.     Palpations: Abdomen is soft.     Tenderness: There is no abdominal tenderness.  Musculoskeletal:        General: No swelling or tenderness.     Cervical back: Neck supple. No tenderness.  Lymphadenopathy:     Cervical: No cervical adenopathy.  Skin:    Findings: No erythema or rash.  Neurological:     Mental Status: He is alert.  Psychiatric:        Mood and Affect: Mood normal.        Behavior: Behavior normal.     Outpatient Encounter Medications as of 09/11/2021  Medication Sig   albuterol (PROVENTIL HFA;VENTOLIN HFA) 108 (90 Base) MCG/ACT inhaler Inhale 2 puffs into the lungs every 6 (six) hours as needed for wheezing or shortness of breath.   BREO ELLIPTA 100-25 MCG/INH AEPB INHALE 1 PUFF BY MOUTH EVERY DAY    CALCIUM CITRATE PO Take 1 tablet by mouth daily.   Cholecalciferol (VITAMIN D-3) 1000 UNITS CAPS Take 1 capsule by mouth daily.   Coenzyme Q10 (COQ10) 100 MG  CAPS Take 100 mg by mouth daily.   finasteride (PROSCAR) 5 MG tablet Take 1 tablet (5 mg total) by mouth daily.   Magnesium 250 MG TABS Two tablets q day   Multiple Vitamins-Minerals (CENTRUM SILVER 50+MEN PO) Take 1 tablet by mouth daily.   omeprazole (PRILOSEC) 20 MG capsule Take 1 capsule (20 mg total) by mouth 2 (two) times daily before a meal.   pramipexole (MIRAPEX) 0.5 MG tablet TAKE 1 TABLET BY MOUTH EVERYDAY AT BEDTIME   pravastatin (PRAVACHOL) 40 MG tablet Take 1 tablet (40 mg total) by mouth daily.   No facility-administered encounter medications on file as of 09/11/2021.     Lab Results  Component Value Date   WBC 4.9 02/01/2021   HGB 15.1 02/01/2021   HCT 45.3 02/01/2021   PLT 205.0 02/01/2021   GLUCOSE 96 06/05/2021   CHOL 173 06/05/2021   TRIG 95.0 06/05/2021   HDL 45.50 06/05/2021   LDLDIRECT 154.7 05/22/2013   LDLCALC 109 (H) 06/05/2021   ALT 14 06/05/2021   AST 45 (H) 06/05/2021   NA 140 06/05/2021   K 4.5 06/05/2021   CL 102 06/05/2021   CREATININE 1.31 06/05/2021   BUN 18 06/05/2021   CO2 30 06/05/2021   TSH 2.15 02/01/2021   PSA 1.07 08/22/2020   INR 1.00 11/17/2016    MR BRAIN W WO CONTRAST  Result Date: 12/01/2016 CLINICAL DATA:  History of seizure.  Head seizures as a youth. EXAM: MRI HEAD WITHOUT AND WITH CONTRAST TECHNIQUE: Multiplanar, multiecho pulse sequences of the brain and surrounding structures were obtained without and with intravenous contrast. CONTRAST:  4m MULTIHANCE GADOBENATE DIMEGLUMINE 529 MG/ML IV SOLN COMPARISON:  Head CT 11/17/2016 FINDINGS: Brain: No explanation for seizure. No cortical scarring or mass. Accounting for remnant cysts, the hippocampi are symmetric and unremarkable. Normal brain volume. Very mild hyperintensity in the cerebral white matter, usually attributed  to remote microvascular insults. Lacunar infarcts described on previous head CT are instead dilated perivascular spaces. No specific demyelinating pattern. Vascular: Negative. Skull and upper cervical spine: Negative for marrow lesion Sinuses/Orbits: Focal right maxillary sinusitis with T2 hypointense fluid level that may be proteinaceous. Bilateral cataract resection. IMPRESSION: 1. No explanation for seizure.  Unremarkable brain MRI. 2. Structures described is lacunar infarcts on head CT 11/17/2016 are instead dilated perivascular spaces. 3. Right maxillary sinusitis. Electronically Signed   By: JMonte FantasiaM.D.   On: 12/01/2016 15:19       Assessment & Plan:   Problem List Items Addressed This Visit     Abnormal liver function test    Continue diet and exercise.  Follow liver function tests.        Asthma    Continues breo.  Recently has noticed some intermittent wheezing as outlined.  Question if related to increased exposure - saw dust.  Discussed wearing a mask.  Treating acid reflux.  Check cxr.  Has rescue inhaler if needed.         Cough    Intermittent cough and wheezing as outlined.  Using breo.  Has rescue inhaler to use if needed.  Check cxr.        Relevant Orders   DG Chest 2 View (Completed)   GERD (gastroesophageal reflux disease)    On omeprazole.  Discussed bid dosing.  Recent wheezing and intermittent cough.  Acid appears to be controlled on PPI. Due colonoscopy. Will have GI evaluate - question of need for f/u EGD.  Hypercholesterolemia - Primary    Continue statin medication.  Low cholesterol diet and exercise.  Follow lipid panel and liver function tests.         Relevant Orders   Lipid Profile   Basic Metabolic Panel (BMET)   Hepatic function panel   Osteoporosis    Continue weight bearing exercise.  Check vitamin d level.        Relevant Orders   VITAMIN D 25 Hydroxy (Vit-D Deficiency, Fractures)   Vitamin B12   Magnesium   Restless  leg syndrome    Doing well on mirapex.  Follow.         Sleep apnea    Using cpap.  Compliant.         Other Visit Diagnoses     Colon cancer screening       Relevant Orders   Ambulatory referral to Gastroenterology   Prostate cancer screening       Relevant Orders   PSA, Medicare        Einar Pheasant, MD

## 2021-09-13 ENCOUNTER — Telehealth: Payer: Self-pay

## 2021-09-13 NOTE — Telephone Encounter (Signed)
CALLED PATIENT NO ANSWER LEFT VOICEMAIL FOR A CALL BACK ? ?

## 2021-09-14 ENCOUNTER — Encounter: Payer: Self-pay | Admitting: Internal Medicine

## 2021-09-14 NOTE — Assessment & Plan Note (Signed)
Continues breo.  Recently has noticed some intermittent wheezing as outlined.  Question if related to increased exposure - saw dust.  Discussed wearing a mask.  Treating acid reflux.  Check cxr.  Has rescue inhaler if needed.

## 2021-09-14 NOTE — Assessment & Plan Note (Signed)
On omeprazole.  Discussed bid dosing.  Recent wheezing and intermittent cough.  Acid appears to be controlled on PPI. Due colonoscopy. Will have GI evaluate - question of need for f/u EGD.

## 2021-09-14 NOTE — Assessment & Plan Note (Signed)
Using cpap.  Compliant.   

## 2021-09-14 NOTE — Assessment & Plan Note (Signed)
Intermittent cough and wheezing as outlined.  Using breo.  Has rescue inhaler to use if needed.  Check cxr.

## 2021-09-14 NOTE — Assessment & Plan Note (Signed)
Continue statin medication.  Low cholesterol diet and exercise.  Follow lipid panel and liver function tests.   

## 2021-09-14 NOTE — Assessment & Plan Note (Signed)
Continue diet and exercise.  Follow liver function tests.   

## 2021-09-14 NOTE — Assessment & Plan Note (Signed)
Continue weight bearing exercise.  Check vitamin d level.

## 2021-09-14 NOTE — Assessment & Plan Note (Signed)
Doing well on mirapex.  Follow.   

## 2021-09-16 ENCOUNTER — Encounter: Payer: Self-pay | Admitting: Internal Medicine

## 2021-09-16 DIAGNOSIS — R9389 Abnormal findings on diagnostic imaging of other specified body structures: Secondary | ICD-10-CM | POA: Insufficient documentation

## 2021-09-27 ENCOUNTER — Telehealth: Payer: Self-pay

## 2021-09-27 NOTE — Telephone Encounter (Signed)
Pt sched w/ Pulmonology

## 2021-09-28 ENCOUNTER — Other Ambulatory Visit (INDEPENDENT_AMBULATORY_CARE_PROVIDER_SITE_OTHER): Payer: Medicare Other

## 2021-09-28 ENCOUNTER — Encounter: Payer: Self-pay | Admitting: Internal Medicine

## 2021-09-28 DIAGNOSIS — E7800 Pure hypercholesterolemia, unspecified: Secondary | ICD-10-CM | POA: Diagnosis not present

## 2021-09-28 DIAGNOSIS — Z125 Encounter for screening for malignant neoplasm of prostate: Secondary | ICD-10-CM

## 2021-09-28 DIAGNOSIS — M810 Age-related osteoporosis without current pathological fracture: Secondary | ICD-10-CM

## 2021-09-28 LAB — HEPATIC FUNCTION PANEL
ALT: 15 U/L (ref 0–53)
AST: 44 U/L — ABNORMAL HIGH (ref 0–37)
Albumin: 3.9 g/dL (ref 3.5–5.2)
Alkaline Phosphatase: 59 U/L (ref 39–117)
Bilirubin, Direct: 0.1 mg/dL (ref 0.0–0.3)
Total Bilirubin: 0.7 mg/dL (ref 0.2–1.2)
Total Protein: 6.5 g/dL (ref 6.0–8.3)

## 2021-09-28 LAB — LIPID PANEL
Cholesterol: 165 mg/dL (ref 0–200)
HDL: 45.7 mg/dL (ref >39.00)
LDL Cholesterol: 96 mg/dL (ref 0–99)
NonHDL: 119.58
Total CHOL/HDL Ratio: 4
Triglycerides: 116 mg/dL (ref 0.0–149.0)
VLDL: 23.2 mg/dL (ref 0.0–40.0)

## 2021-09-28 LAB — MAGNESIUM: Magnesium: 2 mg/dL (ref 1.5–2.5)

## 2021-09-28 LAB — BASIC METABOLIC PANEL
BUN: 14 mg/dL (ref 6–23)
CO2: 31 mEq/L (ref 19–32)
Calcium: 9.3 mg/dL (ref 8.4–10.5)
Chloride: 101 mEq/L (ref 96–112)
Creatinine, Ser: 1.14 mg/dL (ref 0.40–1.50)
GFR: 63.27 mL/min (ref >60.00)
Glucose, Bld: 84 mg/dL (ref 70–99)
Potassium: 4.2 mEq/L (ref 3.5–5.1)
Sodium: 140 mEq/L (ref 135–145)

## 2021-09-28 LAB — PSA, MEDICARE: PSA: 1.1 ng/ml (ref 0.10–4.00)

## 2021-09-28 LAB — VITAMIN D 25 HYDROXY (VIT D DEFICIENCY, FRACTURES): VITD: 35.18 ng/mL (ref 30.00–100.00)

## 2021-09-28 LAB — VITAMIN B12: Vitamin B-12: 508 pg/mL (ref 211–911)

## 2021-09-29 ENCOUNTER — Other Ambulatory Visit: Payer: Self-pay

## 2021-09-29 MED ORDER — ALBUTEROL SULFATE HFA 108 (90 BASE) MCG/ACT IN AERS: 2.0000 | INHALATION_SPRAY | Freq: Four times a day (QID) | RESPIRATORY_TRACT | 2 refills | Status: DC | PRN

## 2021-10-21 ENCOUNTER — Other Ambulatory Visit: Payer: Self-pay | Admitting: Internal Medicine

## 2021-11-30 DIAGNOSIS — L570 Actinic keratosis: Secondary | ICD-10-CM | POA: Diagnosis not present

## 2021-12-08 ENCOUNTER — Ambulatory Visit (INDEPENDENT_AMBULATORY_CARE_PROVIDER_SITE_OTHER): Payer: Medicare Other | Admitting: Internal Medicine

## 2021-12-08 ENCOUNTER — Ambulatory Visit
Admission: RE | Admit: 2021-12-08 | Discharge: 2021-12-08 | Disposition: A | Discharge status: Home / Self Care | Payer: Medicare Other | Source: Ambulatory Visit | Attending: Internal Medicine | Admitting: Internal Medicine

## 2021-12-08 ENCOUNTER — Encounter: Payer: Self-pay | Admitting: Internal Medicine

## 2021-12-08 ENCOUNTER — Other Ambulatory Visit
Admission: RE | Admit: 2021-12-08 | Discharge: 2021-12-08 | Disposition: A | Discharge status: Home / Self Care | Payer: Medicare Other | Source: Ambulatory Visit | Attending: Internal Medicine | Admitting: Internal Medicine

## 2021-12-08 DIAGNOSIS — R0609 Other forms of dyspnea: Secondary | ICD-10-CM | POA: Insufficient documentation

## 2021-12-08 DIAGNOSIS — R059 Cough, unspecified: Secondary | ICD-10-CM | POA: Diagnosis not present

## 2021-12-08 LAB — D-DIMER, QUANTITATIVE: D-Dimer, Quant: 0.65 ug/mL-FEU — ABNORMAL HIGH (ref 0.00–0.50)

## 2021-12-08 LAB — CBC WITH DIFFERENTIAL/PLATELET
Abs Immature Granulocytes: 0.02 10*3/uL (ref 0.00–0.07)
Basophils Absolute: 0.1 10*3/uL (ref 0.0–0.1)
Basophils Relative: 1 %
Eosinophils Absolute: 0.1 10*3/uL (ref 0.0–0.5)
Eosinophils Relative: 2 %
HCT: 46.2 % (ref 39.0–52.0)
Hemoglobin: 15.7 g/dL (ref 13.0–17.0)
Immature Granulocytes: 0 %
Lymphocytes Relative: 19 %
Lymphs Abs: 1.2 10*3/uL (ref 0.7–4.0)
MCH: 31.5 pg (ref 26.0–34.0)
MCHC: 34 g/dL (ref 30.0–36.0)
MCV: 92.8 fL (ref 80.0–100.0)
Monocytes Absolute: 0.6 10*3/uL (ref 0.1–1.0)
Monocytes Relative: 9 %
Neutro Abs: 4.2 10*3/uL (ref 1.7–7.7)
Neutrophils Relative %: 69 %
Platelets: 214 10*3/uL (ref 150–400)
RBC: 4.98 MIL/uL (ref 4.22–5.81)
RDW: 12.9 % (ref 11.5–15.5)
WBC: 6.1 10*3/uL (ref 4.0–10.5)
nRBC: 0 % (ref 0.0–0.2)

## 2021-12-08 LAB — BRAIN NATRIURETIC PEPTIDE: B Natriuretic Peptide: 25.2 pg/mL (ref 0.0–100.0)

## 2021-12-08 LAB — TSH: TSH: 2.319 u[IU]/mL (ref 0.350–4.500)

## 2021-12-08 MED ORDER — BUDESONIDE-FORMOTEROL FUMARATE 80-4.5 MCG/ACT IN AERO: INHALATION_SPRAY | RESPIRATORY_TRACT | 12 refills | Status: DC

## 2021-12-08 MED ORDER — BREZTRI AEROSPHERE 160-9-4.8 MCG/ACT IN AERO: 2.0000 | INHALATION_SPRAY | Freq: Two times a day (BID) | RESPIRATORY_TRACT | 0 refills | Status: DC

## 2021-12-08 NOTE — Progress Notes (Signed)
Joseph Good., male    DOB: 05/25/1945   MRN: [id number]   Brief patient profile:  76  yowm   never smoker with asthma dx age 76  referred to pulmonary clinic in [facility]  12/08/2021 by Dr Einar Pheasant  for congestion wheezing since late spring of 2023      History of Present Illness  12/08/2021  Pulmonary/ 1st office eval/ [name] / [place] [facility] [place]  Patient presents with   Consult    Hx of asthma since 76.  Under good control until he was doing wood working in shop without mask on.  Last couple of months has had wheezing and heavy breathing.  Sats at home are 93% at rest.  It is higher with activity.  Notices wheezing and heavy breathing can be anytime, notice it after eating and when laying down.  Dyspnea:  hills are more difficult than they were/ breathing is noisier per wife x when he uses cpap  Still doing treadmill was doing it daily now once every other week =  0.7 mph x 30 min level  Cough: "always coughed," worse than usual x shortly after wood dust exp now back to baselin  Sleep: flat one pillow on cpap no wheeze  SABA use: avg one every 2 days   No obvious other patterns inday to day or daytime  variability or assoc excess/ purulent sputum or mucus plugs or hemoptysis or cp or chest tightness, subjective wheeze or overt sinus or hb symptoms.   Sleeping fine on cpap  without nocturnal  or early am exacerbation  of respiratory  c/o's or need for noct saba. Also denies any obvious fluctuation of symptoms with weather or environmental changes or other aggravating or alleviating factors except as outlined above   No unusual exposure hx or h/o childhood pna/ asthma or knowledge of premature birth.  Current Allergies, Complete Past Medical History, Past Surgical History, Family History, and Social History were reviewed in [employer] record.  ROS  The following are not active complaints unless bolded Hoarseness, sore throat,  dysphagia, dental problems, itching, sneezing,  nasal congestion or discharge of excess mucus or purulent secretions, ear ache,   fever, chills, sweats, unintended wt loss or wt gain, classically pleuritic or exertional cp,  orthopnea pnd or arm/hand swelling  or leg swelling, presyncope, palpitations, abdominal pain, anorexia, nausea, vomiting, diarrhea  or change in bowel habits or change in bladder habits, change in stools or change in urine, dysuria, hematuria,  rash, arthralgias, visual complaints, headache, numbness, weakness or ataxia or problems with walking or coordination,  change in mood or  memory.           Past Medical History:  Diagnosis Date   Asthma    Bladder outlet obstruction    Crohn's disease ([place])    appendix, s/p appendectomy   GERD (gastroesophageal reflux disease)    schatzki ring   Hypercholesterolemia    Restless leg syndrome    Seizure disorder (H[facility])    age 37, previously on phenobarbital   Skin cancer, basal cell    Sleep apnea     Outpatient Medications Prior to Visit  Medication Sig Dispense Refill   albuterol (VENTOLIN HFA) 108 (90 Base) MCG/ACT inhaler Inhale 2 puffs into the lungs every 6 (six) hours as needed for wheezing or shortness of breath. 1 each 2   CALCIUM CITRATE PO Take 1 tablet by mouth daily.  Cholecalciferol (VITAMIN D-3) 1000 UNITS CAPS Take 1 capsule by mouth daily.     Coenzyme Q10 (COQ10) 100 MG CAPS Take 100 mg by mouth daily.     finasteride (PROSCAR) 5 MG tablet Take 1 tablet (5 mg total) by mouth daily. 90 tablet 3   Magnesium 250 MG TABS Two tablets q day     Multiple Vitamins-Minerals (CENTRUM SILVER 50+MEN PO) Take 1 tablet by mouth daily.     omeprazole (PRILOSEC) 20 MG capsule Take 1 capsule (20 mg total) by mouth 2 (two) times daily before a meal. 180 capsule 3   pramipexole (MIRAPEX) 0.5 MG tablet TAKE 1 TABLET BY MOUTH EVERYDAY AT BEDTIME 90 tablet 1   pravastatin (PRAVACHOL) 40 MG tablet TAKE 1 TABLET BY MOUTH EVERY  DAY 90 tablet 1   BREO ELLIPTA 100-25 MCG/INH AEPB INHALE 1 PUFF BY MOUTH EVERY DAY 180 each 3   No facility-administered medications prior to visit.     Objective:     BP 120/80 (BP Location: Left Arm, Patient Position: Sitting, Cuff Size: Large)   Pulse 78   Temp 97.7 F (36.5 C) (Axillary)   Ht '5\' 9"'$  (1.753 m)   Wt 229 lb 12.8 oz (104.2 kg)   SpO2 96%   BMI 33.94 kg/m   SpO2: 96 %  Amb mod obese pleasant wm / occ throat clearing/ classic pseudowheeze resolves with PLM   HEENT : Oropharynx  clear     Nasal turbinates nl    NECK :  without  apparent JVD/ palpable Nodes/TM    LUNGS: no acc muscle use,  Nl contour chest which is clear to A and P bilaterally without cough on insp or exp maneuvers   CV:  RRR  no s3 or murmur or increase in P2, and no edema   ABD: obese  soft and nontender with nl inspiratory excursion in the supine position. No bruits or organomegaly appreciated   MS:  Nl gait/ ext warm without deformities Or obvious joint restrictions  calf tenderness, cyanosis or clubbing    SKIN: warm and dry without lesions    NEURO:  alert, approp, nl sensorium with  no motor or cerebellar deficits apparent.   CXR PA and Lateral:   12/08/2021 :    I personally reviewed images and impression is as follows:     Mild T kyphosis    Labs ordered/ reviewed:      Chemistry      Component Value Date/Time   NA 140 09/28/2021 0809   K 4.2 09/28/2021 0809   CL 101 09/28/2021 0809   CO2 31 09/28/2021 0809   BUN 14 09/28/2021 0809   CREATININE 1.14 09/28/2021 0809      Component Value Date/Time   CALCIUM 9.3 09/28/2021 0809   ALKPHOS 59 09/28/2021 0809   AST 44 (H) 09/28/2021 0809   ALT 15 09/28/2021 0809   BILITOT 0.7 09/28/2021 0809        Lab Results  Component Value Date   WBC 6.1 12/08/2021   HGB 15.7 12/08/2021   HCT 46.2 12/08/2021   MCV 92.8 12/08/2021   PLT 214 12/08/2021     Lab Results  Component Value Date   DDIMER 0.65 (H) 12/08/2021       Lab Results  Component Value Date   TSH 2.319 12/08/2021     BNP  12/08/2021   25        Assessment   DOE (dyspnea on exertion) Onset late spring  2024 p wood dust ex> severe cough on BREO - Allergy screen 12/08/2021 >  Eos 0. /  IgE   - 12/08/2021  After extensive coaching inhaler device,  effectiveness =    75% from a baseline of near 0 so not sure really using inhalers previously effectively> try symbicort 80 2bid pending pfts  - 12/08/2021   Walked on RA  x  3  lap(s) =  approx 525  ft  @ nl pace, stopped due to end of study s sob and  with lowest 02 sats 97%  Symptoms are markedly disproportionate to objective findings and not clear to what extent this is actually a pulmonary  problem but pt does appear to have difficult to sort out respiratory symptoms of unknown origin for which  DDX  = almost all start with A and  include Adherence, Ace Inhibitors, Acid Reflux, Active Sinus Disease, Alpha 1 Antitripsin deficiency, Anxiety masquerading as Airways dz,  ABPA,  Allergy(esp in young), Aspiration (esp in elderly), Adverse effects of meds,  Active smoking or Vaping, A bunch of PE's/clot burden (a few small clots can't cause this syndrome unless there is already severe underlying pulm or vascular dz with poor reserve),  Anemia or thyroid disorder, plus two Bs  = Bronchiectasis and Beta blocker use..and one C= CHF     Adherence is always the initial "prime suspect" and is a multilayered concern that requires a "trust but verify" approach in every patient - starting with knowing how to use medications, especially inhalers, correctly, keeping up with refills and understanding the fundamental difference between maintenance and prns vs those medications only taken for a very short course and then stopped and not refilled.  - see hfa teaching - return with all meds in hand using a trust but verify approach to confirm accurate Medication  Reconciliation The principal here is that until we are  certain that the  patients are doing what we've asked, it makes no sense to ask them to do more.   ? Acid (or non-acid) GERD > always difficult to exclude as up to 75% of pts in some series report no assoc GI/ Heartburn symptoms> rec max (24h)  acid suppression and diet restrictions/ reviewed and instructions given in writing.   ? Adverse drug effects > try off DPI given patter of cough more c/w upper airway cough syndrome than asthma  ? Allergy/asthma > try change breo to symb 80 2bid as won't irritate the upper airway as much   Anemia/ thyrodi dz ruled out today.  D dimer nl - while a normal  or high normal value (seen commonly in the elderly or chronically ill)  may miss small peripheral pe, the clot burden with sob is moderately high and the d dimer  has a very high neg pred value if used in this setting.    ?CHF > ruled out today with such a low bnp/ cxr nl   >>> f/u w/in 6 weeks with pfts   Each maintenance medication was reviewed in detail including emphasizing most importantly the difference between maintenance and prns and under what circumstances the prns are to be triggered using an action plan format where appropriate.  Total time for H and P, chart review, counseling, reviewing hfa device(s) , directly observing portions of ambulatory 02 saturation study/ and generating customized AVS unique to this office visit / same day charting = > 45 min new pt eval  Christinia Gully, MD 12/08/2021

## 2021-12-08 NOTE — Patient Instructions (Signed)
Prilosec Take 30- 60 min before your first and last meals of the day     GERD (REFLUX)  is an extremely common cause of respiratory symptoms just like yours , many times with no obvious heartburn at all.    It can be treated with medication, but also with lifestyle changes including elevation of the head of your bed (ideally with 6 -8inch blocks under the headboard of your bed),  Smoking cessation, avoidance of late meals, excessive alcohol, and avoid fatty foods, chocolate, peppermint, colas, red wine, and acidic juices such as orange juice.  NO MINT OR MENTHOL PRODUCTS SO NO COUGH DROPS  USE SUGARLESS CANDY INSTEAD (Jolley ranchers or Stover's or Life Savers) or even ice chips will also do - the key is to swallow to prevent all throat clearing. NO OIL BASED VITAMINS - use powdered substitutes.  Avoid fish oil when coughing.   Stop BREO and start Symbicort 80 Take 2 puffs first thing in am and then another 2 puffs about 12 hours later.   Only use your albuterol as a rescue medication to be used if you can't catch your breath by resting or doing a relaxed purse lip breathing pattern.  - The less you use it, the better it will work when you need it. - Ok to use up to 2 puffs  every 4 hours if you must but call for immediate appointment if use goes up over your usual need - Don't leave home without it !!  (think of it like the spare tire for your car)   Please remember to go to the lab and x-ray department  for your tests - we will call you with the results when they are available.   Please schedule a follow up office visit in 6 weeks, call sooner if needed

## 2021-12-08 NOTE — Assessment & Plan Note (Addendum)
Onset late spring 2024 p wood dust ex> severe cough on BREO - Allergy screen 12/08/2021 >  Eos 0. /  IgE   - 12/08/2021  After extensive coaching inhaler device,  effectiveness =    75% from a baseline of near 0 so not sure really using inhalers previously effectively> try symbicort 80 2bid pending pfts  - 12/08/2021   Walked on RA  x  3  lap(s) =  approx 525  ft  @ nl pace, stopped due to end of study s sob and  with lowest 02 sats 97%  Symptoms are markedly disproportionate to objective findings and not clear to what extent this is actually a pulmonary  problem but pt does appear to have difficult to sort out respiratory symptoms of unknown origin for which  DDX  = almost all start with A and  include Adherence, Ace Inhibitors, Acid Reflux, Active Sinus Disease, Alpha 1 Antitripsin deficiency, Anxiety masquerading as Airways dz,  ABPA,  Allergy(esp in young), Aspiration (esp in elderly), Adverse effects of meds,  Active smoking or Vaping, A bunch of PE's/clot burden (a few small clots can't cause this syndrome unless there is already severe underlying pulm or vascular dz with poor reserve),  Anemia or thyroid disorder, plus two Bs  = Bronchiectasis and Beta blocker use..and one C= CHF     Adherence is always the initial "prime suspect" and is a multilayered concern that requires a "trust but verify" approach in every patient - starting with knowing how to use medications, especially inhalers, correctly, keeping up with refills and understanding the fundamental difference between maintenance and prns vs those medications only taken for a very short course and then stopped and not refilled.  - see hfa teaching - return with all meds in hand using a trust but verify approach to confirm accurate Medication  Reconciliation The principal here is that until we are certain that the  patients are doing what we've asked, it makes no sense to ask them to do more.   ? Acid (or non-acid) GERD > always difficult to  exclude as up to 75% of pts in some series report no assoc GI/ Heartburn symptoms> rec max (24h)  acid suppression and diet restrictions/ reviewed and instructions given in writing.   ? Adverse drug effects > try off DPI given patter of cough more c/w upper airway cough syndrome than asthma  ? Allergy/asthma > try change breo to symb 80 2bid as won't irritate the upper airway as much   Anemia/ thyrodi dz ruled out today.  D dimer nl - while a normal  or high normal value (seen commonly in the elderly or chronically ill)  may miss small peripheral pe, the clot burden with sob is moderately high and the d dimer  has a very high neg pred value if used in this setting.    ?CHF > ruled out today with such a low bnp/ cxr nl   >>> f/u w/in 6 weeks with pfts   Each maintenance medication was reviewed in detail including emphasizing most importantly the difference between maintenance and prns and under what circumstances the prns are to be triggered using an action plan format where appropriate.  Total time for H and P, chart review, counseling, reviewing hfa device(s) , directly observing portions of ambulatory 02 saturation study/ and generating customized AVS unique to this office visit / same day charting = > 45 min new pt eval

## 2021-12-12 LAB — IGE: IgE (Immunoglobulin E), Serum: 239 IU/mL (ref 6–495)

## 2022-01-16 ENCOUNTER — Other Ambulatory Visit: Payer: Self-pay | Admitting: Internal Medicine

## 2022-01-18 ENCOUNTER — Ambulatory Visit (INDEPENDENT_AMBULATORY_CARE_PROVIDER_SITE_OTHER): Payer: Medicare Other | Admitting: Internal Medicine

## 2022-01-18 ENCOUNTER — Encounter: Payer: Self-pay | Admitting: Internal Medicine

## 2022-01-18 ENCOUNTER — Other Ambulatory Visit: Payer: Self-pay | Admitting: Internal Medicine

## 2022-01-18 VITALS — BP 120/80 | HR 68 | Temp 98.0°F | Ht 69.0 in | Wt 233.4 lb

## 2022-01-18 DIAGNOSIS — R0609 Other forms of dyspnea: Secondary | ICD-10-CM | POA: Diagnosis not present

## 2022-01-18 DIAGNOSIS — Z23 Encounter for immunization: Secondary | ICD-10-CM

## 2022-01-18 DIAGNOSIS — R058 Other specified cough: Secondary | ICD-10-CM | POA: Diagnosis not present

## 2022-01-18 NOTE — Assessment & Plan Note (Addendum)
Onset late spring 2023 p wood dust ex> severe cough on BREO - PFTs  04/20/18 nl x for ERV 8% at wt 230 and min concavity to f/v loop - Allergy screen 12/08/2021 >  Eos 0.1 /  IgE  239 - 12/08/2021  After extensive coaching inhaler device,  effectiveness =    75% from a baseline of near 0 so not sure really using inhalers previously effectively> try symbicort 80 2bid pending pfts  - 12/08/2021   Walked on RA  x  3  lap(s) =  approx 525  ft  @ nl pace, stopped due to end of study s sob and  with lowest 02 sats 97%  Reports problems with 1st dose of breztri so never filled rx for symb 80 but improved doe back on breo and bid ppi ac.  Only remaining concern is excess throat clearing (see separate a/p)   - The proper method of use, as well as anticipated side effects, of a metered-dose inhaler were discussed and demonstrated to the patient using teach back method.   Can consider trial of symb 80 2bid if throat clearing continues on diet/bed blocks   Pulmonary f/u is prn

## 2022-01-18 NOTE — Progress Notes (Signed)
Joseph Good., male    DOB: Aug 19, 1945   MRN: [id number]   Brief patient profile:  34yowm  never smoker with asthma dx age 76  referred to pulmonary clinic in [facility]  12/08/2021 by Dr Einar Pheasant  for congestion wheezing since late spring of 2023     History of Present Illness  12/08/2021  Pulmonary/ 1st office eval/ Joseph Good / [place] [facility] [place]  Patient presents with   Consult    Hx of asthma since 3.  Under good control until he was doing wood working in shop without mask on.  Last couple of months has had wheezing and heavy breathing.  Sats at home are 93% at rest.  It is higher with activity.  Notices wheezing and heavy breathing can be anytime, notice it after eating and when laying down.  Dyspnea:  hills are more difficult than they were/ breathing is noisier per wife x when he uses cpap  Still doing treadmill was doing it daily now once every other week =  0.7 mph x 30 min level  Cough: "always coughed," worse than usual x shortly after wood dust exp now back to baselin  Sleep: flat one pillow on cpap no wheeze  SABA use: avg one every 2 days  Rec Prilosec Take 30- 60 min before your first and last meals of the day    GERD diet Stop BREO and start Symbicort 80 Take 2 puffs first thing in am and then another 2 puffs about 12 hours later.  Only use your albuterol as a rescue medication     01/18/2022  f/u ov/Joseph Good/ [facility] re: ? asthma   maint on breo   Chief Complaint  Patient presents with   Follow-up    Breztri caused sob, did not get the symbicort after reaction to [place].  Would prefer to stay on the Breo since it is working and it is once a day.  Dyspnea:  uphill to condo is better and no noisy breathing taking ppi  Cough: still clearing throat  Sleeping: flat be on cpap  SABA use: 3 x since last conversion 02: none  Covid status:   x 3    No obvious day to day or daytime variability or assoc excess/ purulent sputum or  mucus plugs or hemoptysis or cp or chest tightness, subjective wheeze or overt sinus or hb symptoms.   Sleeping ok  without nocturnal  or early am exacerbation  of respiratory  c/o's or need for noct saba. Also denies any obvious fluctuation of symptoms with weather or environmental changes or other aggravating or alleviating factors except as outlined above   No unusual exposure hx or h/o childhood pna/ asthma or knowledge of premature birth.  Current Allergies, Complete Past Medical History, Past Surgical History, Family History, and Social History were reviewed in [employer] record.  ROS  The following are not active complaints unless bolded Hoarseness, sore throat, dysphagia, dental problems, itching, sneezing,  nasal congestion or discharge of excess mucus or purulent secretions, ear ache,   fever, chills, sweats, unintended wt loss or wt gain, classically pleuritic or exertional cp,  orthopnea pnd or arm/hand swelling  or leg swelling, presyncope, palpitations, abdominal pain, anorexia, nausea, vomiting, diarrhea  or change in bowel habits or change in bladder habits, change in stools or change in urine, dysuria, hematuria,  rash, arthralgias, visual complaints, headache, numbness, weakness or ataxia or problems with walking or coordination,  change in mood or  memory.        Current Meds  Medication Sig   albuterol (VENTOLIN HFA) 108 (90 Base) MCG/ACT inhaler Inhale 2 puffs into the lungs every 6 (six) hours as needed for wheezing or shortness of breath.   CALCIUM CITRATE PO Take 1 tablet by mouth daily.   Cholecalciferol (VITAMIN D-3) 1000 UNITS CAPS Take 1 capsule by mouth daily.   Coenzyme Q10 (COQ10) 100 MG CAPS Take 100 mg by mouth daily.   finasteride (PROSCAR) 5 MG tablet Take 1 tablet (5 mg total) by mouth daily.   fluticasone furoate-vilanterol (BREO ELLIPTA) 100-25 MCG/ACT AEPB 1 puff daily.   Magnesium 250 MG TABS Two tablets q day   Multiple  Vitamins-Minerals (CENTRUM SILVER 50+MEN PO) Take 1 tablet by mouth daily.   omeprazole (PRILOSEC) 20 MG capsule Take 1 capsule (20 mg total) by mouth 2 (two) times daily before a meal.   pramipexole (MIRAPEX) 0.5 MG tablet TAKE 1 TABLET BY MOUTH EVERYDAY AT BEDTIME   pravastatin (PRAVACHOL) 40 MG tablet TAKE 1 TABLET BY MOUTH EVERY DAY                   Past Medical History:  Diagnosis Date   Asthma    Bladder outlet obstruction    Crohn's disease ([place])    appendix, s/p appendectomy   GERD (gastroesophageal reflux disease)    schatzki ring   Hypercholesterolemia    Restless leg syndrome    Seizure disorder ([place])    age 12, previously on phenobarbital   Skin cancer, basal cell    Sleep apnea        Objective:       Wt Readings from Last 3 Encounters:  01/18/22 233 lb 6.4 oz (105.9 kg)  12/08/21 229 lb 12.8 oz (104.2 kg)  09/11/21 230 lb 6.4 oz (104.5 kg)    Vital signs reviewed  01/18/2022  - Note at rest 02 sats  96% on RA   General appearance:    amb mod obese wm nad vigorous throat clearing   HEENT : mask in place / declined exam   NECK :  without  apparent JVD/ palpable Nodes/TM    LUNGS: no acc muscle use,  Nl contour chest which is clear to A and P bilaterally without cough on insp or exp maneuvers   CV:  RRR  no s3 or murmur or increase in P2, and no edema   ABD:  obese soft and nontender with nl inspiratory excursion in the supine position. No bruits or organomegaly appreciated   MS:  Nl gait/ ext warm without deformities Or obvious joint restrictions  calf tenderness, cyanosis or clubbing    SKIN: warm and dry without lesions    NEURO:  alert, approp, nl sensorium with  no motor or cerebellar deficits apparent.          Assessment

## 2022-01-18 NOTE — Patient Instructions (Addendum)
Strongly consider bed blocks and avoid chocolate   If you are still having the throat clearing problem, I would symbicort 80 Take 1- 2 puffs first thing in am and then another 2 puffs about 12 hours later.   Follow up as needed

## 2022-01-18 NOTE — Assessment & Plan Note (Signed)
Onset ?  (pt says for decades at ov 01/18/2022 ) - 01/18/2022  rec gerd rx/ consider change DPI to hfa symb 80 1-2 bid   Upper airway cough syndrome (previously labeled PNDS),  is so named because it's frequently impossible to sort out how much is  CR/sinusitis with freq throat clearing (which can be related to primary GERD)   vs  causing  secondary (" extra esophageal")  GERD from wide swings in gastric pressure that occur with throat clearing, often  promoting self use of mint and menthol lozenges that reduce the lower esophageal sphincter tone and exacerbate the problem further in a cyclical fashion.   These are the same pts (now being labeled as having "irritable larynx syndrome" by some cough centers) who not infrequently have a history of having failed to tolerate ace inhibitors,  dry powder inhalers(esp advair in high doses, less of a problem on BREO low doses) or biphosphonates or report having atypical/extraesophageal reflux symptoms that don't respond to standard doses of PPI  and are easily confused as having aecopd or asthma flares by even experienced allergists/ pulmonologists (myself included).  rec bed blocks/ diet/ consider trial off BREO  If persists or consult ENT   F/u here prn          Each maintenance medication was reviewed in detail including emphasizing most importantly the difference between maintenance and prns and under what circumstances the prns are to be triggered using an action plan format where appropriate.  Total time for H and P, chart review, counseling, reviewing hfa/dpi device(s) and generating customized AVS unique to this office visit / same day charting > 30 min for  summary final f/u ov

## 2022-01-19 ENCOUNTER — Other Ambulatory Visit: Payer: Self-pay

## 2022-01-19 MED ORDER — FLUTICASONE FUROATE-VILANTEROL 100-25 MCG/ACT IN AEPB: 1.0000 | INHALATION_SPRAY | Freq: Every day | RESPIRATORY_TRACT | 2 refills | Status: DC

## 2022-01-19 MED ORDER — FLUTICASONE FUROATE-VILANTEROL 100-25 MCG/ACT IN AEPB
1.0000 | INHALATION_SPRAY | Freq: Every day | RESPIRATORY_TRACT | 2 refills | Status: DC
  Filled 2022-01-19: qty 60, 30d supply, fill #0

## 2022-01-19 NOTE — Telephone Encounter (Signed)
Breo inhaler sent to [place].  This was the pharmacy marked.  Please confirm went to correct pharmacy.

## 2022-01-19 NOTE — Telephone Encounter (Signed)
See other phone note, refill re sent to [facility] per patient's request

## 2022-01-30 ENCOUNTER — Ambulatory Visit (INDEPENDENT_AMBULATORY_CARE_PROVIDER_SITE_OTHER): Payer: Medicare Other | Admitting: Gastroenterology

## 2022-01-30 ENCOUNTER — Encounter: Payer: Self-pay | Admitting: Gastroenterology

## 2022-01-30 VITALS — BP 113/73 | HR 62 | Temp 97.9°F | Wt 226.0 lb

## 2022-01-30 DIAGNOSIS — Z86010 Personal history of colonic polyps: Secondary | ICD-10-CM | POA: Diagnosis not present

## 2022-01-30 MED ORDER — NA SULFATE-K SULFATE-MG SULF 17.5-3.13-1.6 GM/177ML PO SOLN: 1.0000 | Freq: Once | ORAL | 0 refills | Status: AC

## 2022-01-30 NOTE — Progress Notes (Signed)
Gastroenterology Consultation  Referring Provider:     Einar Pheasant, MD Primary Care Physician:  Einar Pheasant, MD Primary Gastroenterologist:  Dr. Allen Norris     Reason for Consultation:     Need for colonoscopy        HPI:   Joseph Good. is a 76 y.o. y/o male referred for consultation & management of need for colonoscopy by Dr. Einar Pheasant, MD. This patient comes in today with a history of having a colonoscopy in 2007 with a adenoma found at that time.  The patient had been seen at [facility] endoscopy. He has not had a colonoscopy since. The patient denies any unexplained weight loss fevers chills nausea vomiting black stools or bloody stools.   Past Medical History:  Diagnosis Date   Asthma    Bladder outlet obstruction    Crohn's disease ([place])    appendix, s/p appendectomy   GERD (gastroesophageal reflux disease)    schatzki ring   Hypercholesterolemia    Restless leg syndrome    Seizure disorder [facility])    age 37, previously on phenobarbital   Skin cancer, basal cell    Sleep apnea     Past Surgical History:  Procedure Laterality Date   APPENDECTOMY  1992   lysis of adhesions (bowel blockage)   inguinal hernia surgery  1999   left   KNEE ARTHROSCOPY  [place]    Prior to Admission medications   Medication Sig Start Date End Date Taking? Authorizing Provider  albuterol (VENTOLIN HFA) 108 (90 Base) MCG/ACT inhaler Inhale 2 puffs into the lungs every 6 (six) hours as needed for wheezing or shortness of breath. 09/29/21   Einar Pheasant, MD  CALCIUM CITRATE PO Take 1 tablet by mouth daily.    [provider]  Cholecalciferol (VITAMIN D-3) 1000 UNITS CAPS Take 1 capsule by mouth daily.    [provider]  Coenzyme Q10 (COQ10) 100 MG CAPS Take 100 mg by mouth daily.    [provider]  finasteride (PROSCAR) 5 MG tablet Take 1 tablet (5 mg total) by mouth daily. 04/13/21    Einar Pheasant, MD  fluticasone furoate-vilanterol (BREO ELLIPTA) 100-25 MCG/ACT AEPB Inhale 1 puff into the lungs daily. 01/19/22   Einar Pheasant, MD  Magnesium 250 MG TABS Two tablets q day    [provider]  Multiple Vitamins-Minerals (CENTRUM SILVER 50+MEN PO) Take 1 tablet by mouth daily.    [provider]  omeprazole (PRILOSEC) 20 MG capsule Take 1 capsule (20 mg total) by mouth 2 (two) times daily before a meal. 04/13/21   Einar Pheasant, MD  pramipexole (MIRAPEX) 0.5 MG tablet TAKE 1 TABLET BY MOUTH EVERYDAY AT BEDTIME 01/17/22   Einar Pheasant, MD  pravastatin (PRAVACHOL) 40 MG tablet TAKE 1 TABLET BY MOUTH EVERY DAY 10/21/21   Kennyth Arnold, FNP    Family History  Problem Relation Age of Onset   Heart disease Father    Congestive Heart Failure Mother    Colon cancer Neg Hx    Prostate cancer Neg Hx      Social History   Tobacco Use   Smoking status: Never   Smokeless tobacco: Never  Vaping Use   Vaping Use: Never used  Substance Use Topics   Alcohol use: No    Alcohol/week: 0.0 standard drinks of alcohol   Drug use: No    Allergies as of 01/30/2022 -  Review Complete 01/18/2022  Allergen Reaction Noted   Seldane [terfenadine]  04/29/2012    Review of Systems:    All systems reviewed and negative except where noted in HPI.   Physical Exam:  There were no vitals taken for this visit. No LMP for male patient. General:   Alert,  Well-developed, well-nourished, pleasant and cooperative in NAD Head:  Normocephalic and atraumatic. Eyes:  Sclera clear, no icterus.   Conjunctiva pink. Ears:  Normal auditory acuity. Rectal:  Deferred.  Pulses:  Normal pulses noted. Neurologic:  Alert and oriented x3;  grossly normal neurologically. Skin:  Intact without significant lesions or rashes.  No jaundice.  Psych:  Alert and cooperative. Normal mood and affect.  Imaging Studies: No results found.  Assessment and Plan:   Joseph Good. is a  76 y.o. y/o male Who has a history of colon polyps and had his last colonoscopy 2007.  The patient was found to have an adenomatous polyp at that last colonoscopy.  He has not had a repeat and will be set up for repeat colonoscopy.  The patient has been explained the plan and agrees with it.    Joseph Lame, MD. Joseph Good    Note: This dictation was prepared with Dragon dictation along with smaller phrase technology. Any transcriptional errors that result from this process are unintentional.

## 2022-02-01 ENCOUNTER — Encounter: Payer: Self-pay | Admitting: Gastroenterology

## 2022-02-02 ENCOUNTER — Encounter: Payer: Self-pay | Admitting: Anesthesiology

## 2022-02-20 ENCOUNTER — Other Ambulatory Visit: Payer: Self-pay | Admitting: Internal Medicine

## 2022-03-15 ENCOUNTER — Encounter: Payer: Self-pay | Admitting: Internal Medicine

## 2022-03-15 ENCOUNTER — Ambulatory Visit (INDEPENDENT_AMBULATORY_CARE_PROVIDER_SITE_OTHER): Payer: Medicare Other | Admitting: Internal Medicine

## 2022-03-15 VITALS — BP 130/80 | HR 86 | Temp 97.4°F | Resp 17 | Ht 69.0 in | Wt 232.6 lb

## 2022-03-15 DIAGNOSIS — G2581 Restless legs syndrome: Secondary | ICD-10-CM

## 2022-03-15 DIAGNOSIS — R9389 Abnormal findings on diagnostic imaging of other specified body structures: Secondary | ICD-10-CM

## 2022-03-15 DIAGNOSIS — R7989 Other specified abnormal findings of blood chemistry: Secondary | ICD-10-CM | POA: Diagnosis not present

## 2022-03-15 DIAGNOSIS — K219 Gastro-esophageal reflux disease without esophagitis: Secondary | ICD-10-CM

## 2022-03-15 DIAGNOSIS — J4520 Mild intermittent asthma, uncomplicated: Secondary | ICD-10-CM | POA: Diagnosis not present

## 2022-03-15 DIAGNOSIS — G4730 Sleep apnea, unspecified: Secondary | ICD-10-CM

## 2022-03-15 DIAGNOSIS — Z0000 Encounter for general adult medical examination without abnormal findings: Secondary | ICD-10-CM

## 2022-03-15 DIAGNOSIS — R319 Hematuria, unspecified: Secondary | ICD-10-CM

## 2022-03-15 DIAGNOSIS — E7800 Pure hypercholesterolemia, unspecified: Secondary | ICD-10-CM

## 2022-03-15 MED ORDER — PRAMIPEXOLE DIHYDROCHLORIDE 0.5 MG PO TABS
ORAL_TABLET | ORAL | 1 refills | Status: DC
Start: 2022-03-15 — End: 2022-10-02

## 2022-03-15 MED ORDER — PRAVASTATIN SODIUM 40 MG PO TABS: 40.0000 mg | ORAL_TABLET | Freq: Every day | ORAL | 1 refills | Status: DC

## 2022-03-15 NOTE — Progress Notes (Signed)
Patient ID: [name]., male   DOB: 06/26/1945, 76 y.o.   MRN: [id number]   Subjective:    Patient ID: [name]., male    DOB: Sep 22, 1945, 76 y.o.   MRN: [id number]   Patient here for  Chief Complaint  Patient presents with   Annual Exam    CPE   .   HPI With past history of hypercholesterolemia, asthma and abnormal liver function tests.  He comes in today to follow up on these issues as well as for a complete physical exam.  Saw pulmonary 12/2021 - Breo. Breathing stable.  No increased cough or congestion reported.  No chest pain or sob reported.  No abdominal pain.  Bowels moving.  Wearing cpap.  Uses regularly.  Benefits from wearing.     Past Medical History:  Diagnosis Date   Asthma    Bladder outlet obstruction    Crohn's disease ([place])    appendix, s/p appendectomy   GERD (gastroesophageal reflux disease)    schatzki ring   Hypercholesterolemia    Restless leg syndrome    Seizure disorder [facility])    age 53 & 89, previously on phenobarbital   Skin cancer, basal cell    Sleep apnea    CPAP   Past Surgical History:  Procedure Laterality Date   APPENDECTOMY  1992   lysis of adhesions (bowel blockage)   inguinal hernia surgery  1999   left   KNEE ARTHROSCOPY  [place]   TONSILLECTOMY AND ADENOIDECTOMY  1956   Family History  Problem Relation Age of Onset   Heart disease Father    Congestive Heart Failure Mother    Colon cancer Neg Hx    Prostate cancer Neg Hx    Social History   Socioeconomic History   Marital status: Married    Spouse name: Not on file   Number of children: 0   Years of education: Not on file   Highest education level: Not on file  Occupational History   Not on file  Tobacco Use   Smoking status: Never   Smokeless tobacco: Never  Vaping Use   Vaping Use: Never used  Substance and Sexual Activity   Alcohol use: No    Alcohol/week: 0.0 standard drinks of alcohol   Drug use: No   Sexual  activity: Not Currently  Other Topics Concern   Not on file  Social History Narrative   Not on file   Social Determinants of Health   Financial Resource Strain: Low Risk  (07/03/2021)   Overall Financial Resource Strain (CARDIA)    Difficulty of Paying Living Expenses: Not hard at all  Food Insecurity: No Food Insecurity (07/03/2021)   Hunger Vital Sign    Worried About Running Out of Food in the Last Year: Never true    Ran Out of Food in the Last Year: Never true  Transportation Needs: No Transportation Needs (07/03/2021)   PRAPARE - Hydrologist (Medical): No    Lack of Transportation (Non-Medical): No  Physical Activity: Sufficiently Active (07/03/2021)   Exercise Vital Sign    Days of Exercise per Week: 5 days    Minutes of Exercise per Session: 30 min  Stress: No Stress Concern Present (07/03/2021)   [place]    Feeling of Stress : Not at all  Social Connections: Unknown (07/03/2021)   Social Connection and Isolation  Panel [NHANES]    Frequency of Communication with Friends and Family: More than three times a week    Frequency of Social Gatherings with Friends and Family: More than three times a week    Attends Religious Services: Not on Advertising copywriter or Organizations: Not on file    Attends Archivist Meetings: Not on file    Marital Status: Married     Review of Systems  Constitutional:  Negative for appetite change and unexpected weight change.  HENT:  Negative for congestion, sinus pressure and sore throat.   Eyes:  Negative for pain and visual disturbance.  Respiratory:  Negative for cough, chest tightness and shortness of breath.   Cardiovascular:  Negative for chest pain, palpitations and leg swelling.  Gastrointestinal:  Negative for abdominal pain, diarrhea, nausea and vomiting.  Genitourinary:  Negative for difficulty urinating and dysuria.   Musculoskeletal:  Negative for joint swelling and myalgias.  Skin:  Negative for color change and rash.  Neurological:  Negative for dizziness, light-headedness and headaches.  Hematological:  Negative for adenopathy. Does not bruise/bleed easily.  Psychiatric/Behavioral:  Negative for agitation and dysphoric mood.        Objective:     BP 130/80 (BP Location: Left Arm, Patient Position: Sitting, Cuff Size: Large)   Pulse 86   Temp (!) 97.4 F (36.3 C) (Temporal)   Resp 17   Ht '5\' 9"'$  (1.753 m)   Wt 232 lb 9.6 oz (105.5 kg)   SpO2 96%   BMI 34.35 kg/m  Wt Readings from Last 3 Encounters:  03/15/22 232 lb 9.6 oz (105.5 kg)  01/30/22 226 lb (102.5 kg)  01/18/22 233 lb 6.4 oz (105.9 kg)    Physical Exam Constitutional:      General: He is not in acute distress.    Appearance: Normal appearance. He is well-developed.  HENT:     Head: Normocephalic and atraumatic.     Right Ear: External ear normal.     Left Ear: External ear normal.  Eyes:     General: No scleral icterus.       Right eye: No discharge.        Left eye: No discharge.     Conjunctiva/sclera: Conjunctivae normal.  Neck:     Thyroid: No thyromegaly.  Cardiovascular:     Rate and Rhythm: Normal rate and regular rhythm.  Pulmonary:     Effort: No respiratory distress.     Breath sounds: Normal breath sounds. No wheezing.  Abdominal:     General: Bowel sounds are normal.     Palpations: Abdomen is soft.     Tenderness: There is no abdominal tenderness.  Musculoskeletal:        General: No swelling or tenderness.     Cervical back: Neck supple. No tenderness.  Lymphadenopathy:     Cervical: No cervical adenopathy.  Skin:    Findings: No erythema or rash.  Neurological:     Mental Status: He is alert and oriented to person, place, and time.  Psychiatric:        Mood and Affect: Mood normal.        Behavior: Behavior normal.      Outpatient Encounter Medications as of 03/15/2022  Medication Sig    albuterol (VENTOLIN HFA) 108 (90 Base) MCG/ACT inhaler Inhale 2 puffs into the lungs every 6 (six) hours as needed for wheezing or shortness of breath.   CALCIUM CITRATE PO Take 1  tablet by mouth daily.   Cholecalciferol (VITAMIN D-3) 1000 UNITS CAPS Take 1 capsule by mouth daily.   Coenzyme Q10 (COQ10) 100 MG CAPS Take 100 mg by mouth daily.   finasteride (PROSCAR) 5 MG tablet TAKE 1 TABLET (5 MG TOTAL) BY MOUTH DAILY.   fluticasone furoate-vilanterol (BREO ELLIPTA) 100-25 MCG/ACT AEPB Inhale 1 puff into the lungs daily.   Magnesium 250 MG TABS Two tablets q day   Multiple Vitamins-Minerals (CENTRUM SILVER 50+MEN PO) Take 1 tablet by mouth daily.   omeprazole (PRILOSEC) 20 MG capsule TAKE 1 CAPSULE (20 MG TOTAL) BY MOUTH 2 (TWO) TIMES DAILY BEFORE A MEAL.   [DISCONTINUED] pramipexole (MIRAPEX) 0.5 MG tablet TAKE 1 TABLET BY MOUTH EVERYDAY AT BEDTIME   [DISCONTINUED] pravastatin (PRAVACHOL) 40 MG tablet TAKE 1 TABLET BY MOUTH EVERY DAY   pramipexole (MIRAPEX) 0.5 MG tablet Take 1 tablet by mouth at bedtime   pravastatin (PRAVACHOL) 40 MG tablet Take 1 tablet (40 mg total) by mouth daily.   No facility-administered encounter medications on file as of 03/15/2022.     Lab Results  Component Value Date   WBC 6.1 12/08/2021   HGB 15.7 12/08/2021   HCT 46.2 12/08/2021   PLT 214 12/08/2021   GLUCOSE 84 09/28/2021   CHOL 165 09/28/2021   TRIG 116.0 09/28/2021   HDL 45.70 09/28/2021   LDLDIRECT 154.7 05/22/2013   LDLCALC 96 09/28/2021   ALT 15 09/28/2021   AST 44 (H) 09/28/2021   NA 140 09/28/2021   K 4.2 09/28/2021   CL 101 09/28/2021   CREATININE 1.14 09/28/2021   BUN 14 09/28/2021   CO2 31 09/28/2021   TSH 2.319 12/08/2021   PSA 1.10 09/28/2021   INR 1.00 11/17/2016    DG Chest 2 View  Result Date: 12/08/2021 CLINICAL DATA:  76 year old male with cough EXAM: CHEST - 2 VIEW COMPARISON:  09/11/2021 FINDINGS: Cardiomediastinal silhouette unchanged in size and contour. Low lung  volumes persist. No evidence of central vascular congestion. No interlobular septal thickening. No pneumothorax or pleural effusion. Coarsened interstitial markings, with no confluent airspace disease. No acute displaced fracture. Degenerative changes of the spine. IMPRESSION: No active cardiopulmonary disease. Electronically Signed   By: Corrie Mckusick D.O.   On: 12/08/2021 16:34       Assessment & Plan:   Problem List Items Addressed This Visit     Abnormal CXR    Saw pulmonary.  Breo.  Breathing stable.       Abnormal liver function test - Primary    Continue diet and exercise.  Follow liver function tests.       Relevant Orders   Hepatic function panel   Asthma    Seeing Dr Melvyn Novas.  Breathing stable.  Breo.       GERD (gastroesophageal reflux disease)    Continue omeprazole.  Upper symptoms controlled.       Healthcare maintenance    Physical today 03/15/22.  Follow psa. (09/2021 - 1.10).  Colonoscopy 2013. Planned for 07/2022.       Hematuria    Evaluated by urology 10/2019 - recheck urinalysis next labs.       Relevant Orders   Urinalysis, Routine w reflex microscopic   Hypercholesterolemia    Continue statin medication.  Low cholesterol diet and exercise.  Follow lipid panel and liver function tests.        Relevant Medications   pravastatin (PRAVACHOL) 40 MG tablet   Other Relevant Orders   TSH  Lipid panel   Basic metabolic panel   Restless leg syndrome    Doing well on mirapex.  Follow.        Sleep apnea    Using cpap.  Compliant.          Einar Pheasant, MD

## 2022-03-15 NOTE — Assessment & Plan Note (Signed)
Physical today 03/15/22.  Follow psa. (09/2021 - 1.10).  Colonoscopy 2013. Planned for 07/2022.

## 2022-03-20 DIAGNOSIS — L578 Other skin changes due to chronic exposure to nonionizing radiation: Secondary | ICD-10-CM | POA: Diagnosis not present

## 2022-03-20 DIAGNOSIS — Z872 Personal history of diseases of the skin and subcutaneous tissue: Secondary | ICD-10-CM | POA: Diagnosis not present

## 2022-03-20 DIAGNOSIS — L570 Actinic keratosis: Secondary | ICD-10-CM | POA: Diagnosis not present

## 2022-03-20 DIAGNOSIS — D1801 Hemangioma of skin and subcutaneous tissue: Secondary | ICD-10-CM | POA: Diagnosis not present

## 2022-03-20 DIAGNOSIS — Z859 Personal history of malignant neoplasm, unspecified: Secondary | ICD-10-CM | POA: Diagnosis not present

## 2022-03-25 ENCOUNTER — Encounter: Payer: Self-pay | Admitting: Internal Medicine

## 2022-03-25 NOTE — Assessment & Plan Note (Signed)
Seeing Dr Melvyn Novas.  Breathing stable.  Breo.

## 2022-03-25 NOTE — Assessment & Plan Note (Signed)
Continue omeprazole.  Upper symptoms controlled.

## 2022-03-25 NOTE — Assessment & Plan Note (Signed)
Evaluated by urology 10/2019 - recheck urinalysis next labs.

## 2022-03-25 NOTE — Assessment & Plan Note (Signed)
Saw pulmonary.  Breo.  Breathing stable.

## 2022-03-25 NOTE — Assessment & Plan Note (Signed)
Continue diet and exercise.  Follow liver function tests.   

## 2022-03-25 NOTE — Assessment & Plan Note (Signed)
Continue statin medication.  Low cholesterol diet and exercise.  Follow lipid panel and liver function tests.

## 2022-03-25 NOTE — Assessment & Plan Note (Signed)
Doing well on mirapex.  Follow.

## 2022-03-25 NOTE — Assessment & Plan Note (Signed)
Using cpap.  Compliant.

## 2022-03-27 ENCOUNTER — Other Ambulatory Visit: Payer: Self-pay

## 2022-03-27 DIAGNOSIS — G4730 Sleep apnea, unspecified: Secondary | ICD-10-CM

## 2022-03-30 ENCOUNTER — Other Ambulatory Visit (INDEPENDENT_AMBULATORY_CARE_PROVIDER_SITE_OTHER): Payer: Medicare Other

## 2022-03-30 DIAGNOSIS — E7800 Pure hypercholesterolemia, unspecified: Secondary | ICD-10-CM | POA: Diagnosis not present

## 2022-03-30 DIAGNOSIS — R7989 Other specified abnormal findings of blood chemistry: Secondary | ICD-10-CM | POA: Diagnosis not present

## 2022-03-30 DIAGNOSIS — R319 Hematuria, unspecified: Secondary | ICD-10-CM

## 2022-03-30 LAB — BASIC METABOLIC PANEL
BUN: 15 mg/dL (ref 6–23)
CO2: 33 mEq/L — ABNORMAL HIGH (ref 19–32)
Calcium: 9.3 mg/dL (ref 8.4–10.5)
Chloride: 102 mEq/L (ref 96–112)
Creatinine, Ser: 1.2 mg/dL (ref 0.40–1.50)
GFR: 59.29 mL/min — ABNORMAL LOW (ref >60.00)
Glucose, Bld: 89 mg/dL (ref 70–99)
Potassium: 4.3 mEq/L (ref 3.5–5.1)
Sodium: 141 mEq/L (ref 135–145)

## 2022-03-30 LAB — URINALYSIS, ROUTINE W REFLEX MICROSCOPIC
Bilirubin Urine: NEGATIVE
Hgb urine dipstick: NEGATIVE
Ketones, ur: NEGATIVE
Leukocytes,Ua: NEGATIVE
Nitrite: NEGATIVE
RBC / HPF: NONE SEEN (ref 0–?)
Specific Gravity, Urine: 1.01 (ref 1.000–1.030)
Total Protein, Urine: NEGATIVE
Urine Glucose: NEGATIVE
Urobilinogen, UA: 0.2 (ref 0.0–1.0)
WBC, UA: NONE SEEN (ref 0–?)
pH: 7.5 (ref 5.0–8.0)

## 2022-03-30 LAB — LIPID PANEL
Cholesterol: 176 mg/dL (ref 0–200)
HDL: 49.3 mg/dL (ref >39.00)
LDL Cholesterol: 105 mg/dL — ABNORMAL HIGH (ref 0–99)
NonHDL: 126.7
Total CHOL/HDL Ratio: 4
Triglycerides: 111 mg/dL (ref 0.0–149.0)
VLDL: 22.2 mg/dL (ref 0.0–40.0)

## 2022-03-30 LAB — HEPATIC FUNCTION PANEL
ALT: 16 U/L (ref 0–53)
AST: 47 U/L — ABNORMAL HIGH (ref 0–37)
Albumin: 4.1 g/dL (ref 3.5–5.2)
Alkaline Phosphatase: 65 U/L (ref 39–117)
Bilirubin, Direct: 0.2 mg/dL (ref 0.0–0.3)
Total Bilirubin: 0.9 mg/dL (ref 0.2–1.2)
Total Protein: 6.7 g/dL (ref 6.0–8.3)

## 2022-03-30 LAB — TSH: TSH: 2.41 u[IU]/mL (ref 0.35–5.50)

## 2022-04-18 ENCOUNTER — Encounter: Payer: Self-pay | Admitting: Internal Medicine

## 2022-04-19 MED ORDER — FLUTICASONE FUROATE-VILANTEROL 100-25 MCG/ACT IN AEPB: 1.0000 | INHALATION_SPRAY | Freq: Every day | RESPIRATORY_TRACT | 3 refills | Status: DC

## 2022-04-19 NOTE — Telephone Encounter (Signed)
Rx sent in for [facility]. Note made for 90 day supply

## 2022-06-29 ENCOUNTER — Telehealth: Payer: Self-pay | Admitting: Internal Medicine

## 2022-06-29 NOTE — Telephone Encounter (Signed)
Contacted Joseph Good. to schedule their annual wellness visit. Appointment made for 07/05/2022.  Thank you,  [place] Direct dial  [phone]

## 2022-07-05 ENCOUNTER — Ambulatory Visit (INDEPENDENT_AMBULATORY_CARE_PROVIDER_SITE_OTHER): Payer: Medicare Other

## 2022-07-05 VITALS — Ht 69.0 in | Wt 232.0 lb

## 2022-07-05 DIAGNOSIS — H0012 Chalazion right lower eyelid: Secondary | ICD-10-CM | POA: Diagnosis not present

## 2022-07-05 DIAGNOSIS — Z961 Presence of intraocular lens: Secondary | ICD-10-CM | POA: Diagnosis not present

## 2022-07-05 DIAGNOSIS — Z0000 Encounter for general adult medical examination without abnormal findings: Secondary | ICD-10-CM

## 2022-07-05 NOTE — Patient Instructions (Addendum)
Joseph Good , Thank you for taking time to come for your Medicare Wellness Visit. I appreciate your ongoing commitment to your health goals. Please review the following plan we discussed and let me know if I can assist you in the future.   These are the goals we discussed:  Goals      Follow up with Primary Care Provider     As needed.     I would like to lose a little weight     Weight goal 225lb Portion control        This is a list of the screening recommended for you and due dates:  Health Maintenance  Topic Date Due   COVID-19 Vaccine (4 - 2023-24 season) 07/21/2022*   Colon Cancer Screening  09/13/2022*   Zoster (Shingles) Vaccine (1 of 2) 10/05/2022*   Medicare Annual Wellness Visit  07/05/2023   DTaP/Tdap/Td vaccine (2 - Td or Tdap) 12/29/2025   Pneumonia Vaccine  Completed   Flu Shot  Completed   Hepatitis C Screening: USPSTF Recommendation to screen - Ages 18-79 yo.  Completed   HPV Vaccine  Aged Out  *Topic was postponed. The date shown is not the original due date.    Advanced directives: End of life planning; Advance aging; Advanced directives discussed.  Copy of current HCPOA/Living Will requested.    Conditions/risks identified: none new.  Next appointment: Follow up in one year for your annual wellness visit.   Preventive Care 75 Years and Older, Male  Preventive care refers to lifestyle choices and visits with your health care provider that can promote health and wellness. What does preventive care include? A yearly physical exam. This is also called an annual well check. Dental exams once or twice a year. Routine eye exams. Ask your health care provider how often you should have your eyes checked. Personal lifestyle choices, including: Daily care of your teeth and gums. Regular physical activity. Eating a healthy diet. Avoiding tobacco and drug use. Limiting alcohol use. Practicing safe sex. Taking low doses of aspirin every day. Taking vitamin and  mineral supplements as recommended by your health care provider. What happens during an annual well check? The services and screenings done by your health care provider during your annual well check will depend on your age, overall health, lifestyle risk factors, and family history of disease. Counseling  Your health care provider may ask you questions about your: Alcohol use. Tobacco use. Drug use. Emotional well-being. Home and relationship well-being. Sexual activity. Eating habits. History of falls. Memory and ability to understand (cognition). Work and work Statistician. Screening  You may have the following tests or measurements: Height, weight, and BMI. Blood pressure. Lipid and cholesterol levels. These may be checked every 5 years, or more frequently if you are over 60 years old. Skin check. Lung cancer screening. You may have this screening every year starting at age 55 if you have a 30-pack-year history of smoking and currently smoke or have quit within the past 15 years. Fecal occult blood test (FOBT) of the stool. You may have this test every year starting at age 64. Flexible sigmoidoscopy or colonoscopy. You may have a sigmoidoscopy every 5 years or a colonoscopy every 10 years starting at age 83. Prostate cancer screening. Recommendations will vary depending on your family history and other risks. Hepatitis C blood test. Hepatitis B blood test. Sexually transmitted disease (STD) testing. Diabetes screening. This is done by checking your blood sugar (glucose) after you have not  eaten for a while (fasting). You may have this done every 1-3 years. Abdominal aortic aneurysm (AAA) screening. You may need this if you are a current or former smoker. Osteoporosis. You may be screened starting at age 60 if you are at high risk. Talk with your health care provider about your test results, treatment options, and if necessary, the need for more tests. Vaccines  Your health care  provider may recommend certain vaccines, such as: Influenza vaccine. This is recommended every year. Tetanus, diphtheria, and acellular pertussis (Tdap, Td) vaccine. You may need a Td booster every 10 years. Zoster vaccine. You may need this after age 65. Pneumococcal 13-valent conjugate (PCV13) vaccine. One dose is recommended after age 66. Pneumococcal polysaccharide (PPSV23) vaccine. One dose is recommended after age 58. Talk to your health care provider about which screenings and vaccines you need and how often you need them. This information is not intended to replace advice given to you by your health care provider. Make sure you discuss any questions you have with your health care provider. Document Released: 05/13/2015 Document Revised: 01/04/2016 Document Reviewed: 02/15/2015 Elsevier Interactive Patient Education  2017 [place] Prevention in the Home Falls can cause injuries. They can happen to people of all ages. There are many things you can do to make your home safe and to help prevent falls. What can I do on the outside of my home? Regularly fix the edges of walkways and driveways and fix any cracks. Remove anything that might make you trip as you walk through a door, such as a raised step or threshold. Trim any bushes or trees on the path to your home. Use bright outdoor lighting. Clear any walking paths of anything that might make someone trip, such as rocks or tools. Regularly check to see if handrails are loose or broken. Make sure that both sides of any steps have handrails. Any raised decks and porches should have guardrails on the edges. Have any leaves, snow, or ice cleared regularly. Use sand or salt on walking paths during winter. Clean up any spills in your garage right away. This includes oil or grease spills. What can I do in the bathroom? Use night lights. Install grab bars by the toilet and in the tub and shower. Do not use towel bars as grab  bars. Use non-skid mats or decals in the tub or shower. If you need to sit down in the shower, use a plastic, non-slip stool. Keep the floor dry. Clean up any water that spills on the floor as soon as it happens. Remove soap buildup in the tub or shower regularly. Attach bath mats securely with double-sided non-slip rug tape. Do not have throw rugs and other things on the floor that can make you trip. What can I do in the bedroom? Use night lights. Make sure that you have a light by your bed that is easy to reach. Do not use any sheets or blankets that are too big for your bed. They should not hang down onto the floor. Have a firm chair that has side arms. You can use this for support while you get dressed. Do not have throw rugs and other things on the floor that can make you trip. What can I do in the kitchen? Clean up any spills right away. Avoid walking on wet floors. Keep items that you use a lot in easy-to-reach places. If you need to reach something above you, use a strong step stool that  has a grab bar. Keep electrical cords out of the way. Do not use floor polish or wax that makes floors slippery. If you must use wax, use non-skid floor wax. Do not have throw rugs and other things on the floor that can make you trip. What can I do with my stairs? Do not leave any items on the stairs. Make sure that there are handrails on both sides of the stairs and use them. Fix handrails that are broken or loose. Make sure that handrails are as long as the stairways. Check any carpeting to make sure that it is firmly attached to the stairs. Fix any carpet that is loose or worn. Avoid having throw rugs at the top or bottom of the stairs. If you do have throw rugs, attach them to the floor with carpet tape. Make sure that you have a light switch at the top of the stairs and the bottom of the stairs. If you do not have them, ask someone to add them for you. What else can I do to help prevent  falls? Wear shoes that: Do not have high heels. Have rubber bottoms. Are comfortable and fit you well. Are closed at the toe. Do not wear sandals. If you use a stepladder: Make sure that it is fully opened. Do not climb a closed stepladder. Make sure that both sides of the stepladder are locked into place. Ask someone to hold it for you, if possible. Clearly mark and make sure that you can see: Any grab bars or handrails. First and last steps. Where the edge of each step is. Use tools that help you move around (mobility aids) if they are needed. These include: Canes. Walkers. Scooters. Crutches. Turn on the lights when you go into a dark area. Replace any light bulbs as soon as they burn out. Set up your furniture so you have a clear path. Avoid moving your furniture around. If any of your floors are uneven, fix them. If there are any pets around you, be aware of where they are. Review your medicines with your doctor. Some medicines can make you feel dizzy. This can increase your chance of falling. Ask your doctor what other things that you can do to help prevent falls. This information is not intended to replace advice given to you by your health care provider. Make sure you discuss any questions you have with your health care provider. Document Released: 02/10/2009 Document Revised: 09/22/2015 Document Reviewed: 05/21/2014 Elsevier Interactive Patient Education  2017 [employer].

## 2022-07-05 NOTE — Progress Notes (Signed)
Subjective:   Joseph Good. is a 77 y.o. male who presents for Medicare Annual/Subsequent preventive examination.  Review of Systems    No ROS.  Medicare Wellness Virtual Visit.  Visual/audio telehealth visit, UTA vital signs.   See social history for additional risk factors.   Cardiac Risk Factors include: advanced age (>67mn, >>57women);male gender     Objective:    Today's Vitals   07/05/22 1232  Weight: 232 lb (105.2 kg)  Height: '5\' 9"'$  (1.753 m)   Body mass index is 34.26 kg/m.     07/05/2022   12:33 PM 07/03/2021    9:09 AM 06/30/2020    9:18 AM 06/30/2019    9:15 AM 10/07/2017    4:31 PM 11/17/2016    4:58 PM 09/13/2016    1:24 PM  Advanced Directives  Does Patient Have a Medical Advance Directive? Yes Yes No No No No No  Type of AParamed[place]of [place] will Living will       Does patient want to make changes to medical advance directive? No - Patient declined No - Patient declined No - Patient declined      Copy of [place]? No - copy requested        Would patient like information on creating a medical advance directive?    No - Patient declined Yes (MAU/Ambulatory/Procedural Areas - Information given) No - Patient declined No - Patient declined    Current Medications (verified) Outpatient Encounter Medications as of 07/05/2022  Medication Sig   albuterol (VENTOLIN HFA) 108 (90 Base) MCG/ACT inhaler Inhale 2 puffs into the lungs every 6 (six) hours as needed for wheezing or shortness of breath.   CALCIUM CITRATE PO Take 1 tablet by mouth daily.   Cholecalciferol (VITAMIN D-3) 1000 UNITS CAPS Take 1 capsule by mouth daily.   Coenzyme Q10 (COQ10) 100 MG CAPS Take 100 mg by mouth daily.   finasteride (PROSCAR) 5 MG tablet TAKE 1 TABLET (5 MG TOTAL) BY MOUTH DAILY.   fluticasone furoate-vilanterol (BREO ELLIPTA) 100-25 MCG/ACT AEPB Inhale 1 puff into the lungs daily.   Magnesium 250 MG TABS Two tablets q day    Multiple Vitamins-Minerals (CENTRUM SILVER 50+MEN PO) Take 1 tablet by mouth daily.   omeprazole (PRILOSEC) 20 MG capsule TAKE 1 CAPSULE (20 MG TOTAL) BY MOUTH 2 (TWO) TIMES DAILY BEFORE A MEAL.   pramipexole (MIRAPEX) 0.5 MG tablet Take 1 tablet by mouth at bedtime   pravastatin (PRAVACHOL) 40 MG tablet Take 1 tablet (40 mg total) by mouth daily.   No facility-administered encounter medications on file as of 07/05/2022.    Allergies (verified) Seldane [terfenadine]   History: Past Medical History:  Diagnosis Date   Asthma    Bladder outlet obstruction    Cro[place]'s disease ([place]    appendix, s/p appendectomy   GERD (gastroesophageal reflux disease)    schatzki ring   Hypercholesterolemia    Restless leg syndrome    Seizure disorder ([facility]    age 77& 218 previously on phenobarbital   Skin cancer, basal cell    Sleep apnea    CPAP   Past Surgical History:  Procedure Laterality Date   APPENDECTOMY  1992   lysis of adhesions (bowel blockage)   inguinal hernia surgery  1999   left   KNEE ARTHROSCOPY  1[facility]  Family History  Problem Relation Age of Onset   Heart disease Father    Congestive Heart Failure Mother    Colon cancer Neg Hx    Prostate cancer Neg Hx    Social History   Socioeconomic History   Marital status: Married    Spouse name: Not on file   Number of children: 0   Years of education: Not on file   Highest education level: Not on file  Occupational History   Not on file  Tobacco Use   Smoking status: Never   Smokeless tobacco: Never  Vaping Use   Vaping Use: Never used  Substance and Sexual Activity   Alcohol use: No    Alcohol/week: 0.0 standard drinks of alcohol   Drug use: No   Sexual activity: Not Currently  Other Topics Concern   Not on file  Social History Narrative   Not on file   Social Determinants of Health   Financial Resource Strain: Low Risk  (07/01/2022)    Overall Financial Resource Strain (CARDIA)    Difficulty of Paying Living Expenses: Not hard at all  Food Insecurity: No Food Insecurity (07/01/2022)   Hunger Vital Sign    Worried About Running Out of Food in the Last Year: Never true    Ran Out of Food in the Last Year: Never true  Transportation Needs: No Transportation Needs (07/01/2022)   PRAPARE - Hydrologist (Medical): No    Lack of Transportation (Non-Medical): No  Physical Activity: Sufficiently Active (07/01/2022)   Exercise Vital Sign    Days of Exercise per Week: 5 days    Minutes of Exercise per Session: 30 min  Stress: No Stress Concern Present (07/01/2022)   [place]    Feeling of Stress : Only a little  Social Connections: Unknown (07/01/2022)   Social Connection and Isolation Panel [NHANES]    Frequency of Communication with Friends and Family: Twice a week    Frequency of Social Gatherings with Friends and Family: Twice a week    Attends Religious Services: Not on Advertising copywriter or Organizations: No    Attends Archivist Meetings: Never    Marital Status: Married    Tobacco Counseling Counseling given: Not Answered   Clinical Intake:  Pre-visit preparation completed: Yes        Diabetes: No  How often do you need to have someone help you when you read instructions, pamphlets, or other written materials from your doctor or pharmacy?: 2 - Rarely    Interpreter Needed?: No      Activities of Daily Living    07/01/2022    9:09 AM  In your present state of health, do you have any difficulty performing the following activities:  Hearing? 0  Vision? 0  Difficulty concentrating or making decisions? 0  Walking or climbing stairs? 0  Dressing or bathing? 0  Doing errands, shopping? 0  Preparing Food and eating ? N  Using the Toilet? N  In the past six months, have you accidently leaked  urine? N  Do you have problems with loss of bowel control? N  Managing your Medications? N  Managing your Finances? N  Housekeeping or managing your Housekeeping? N    Patient Care Team: Einar Pheasant, MD as PCP - General (Internal Medicine)  Indicate any recent Medical Services you may have received from other than [facility] providers in the past year (  date may be approximate).     Assessment:   This is a routine wellness examination for Joseph Good.  Patient Medicare AWV questionnaire was completed by the patient on 07/01/22, I have confirmed that all information answered by patient is correct and no changes since this date.   I connected with  [name]. on 07/05/22 by a audio enabled telemedicine application and verified that I am speaking with the correct person using two identifiers.  Patient Location: Home  Provider Location: Office/Clinic  I discussed the limitations of evaluation and management by telemedicine. The patient expressed understanding and agreed to proceed.   Hearing/Vision screen Hearing Screening - Comments::  Hearing aids, bilateral Vision Screening - Comments:: Followed by [facility]  Wears corrective lenses when reading  Cataract extraction, bilateral  They have regular follow up with the ophthalmologist    Dietary issues and exercise activities discussed: Current Exercise Habits: Home exercise routine, Type of exercise: treadmill, Time (Minutes): 30, Frequency (Times/Week): 5, Weekly Exercise (Minutes/Week): 150, Intensity: Mild Healthy diet Good water intake   Goals Addressed             This Visit's Progress    I would like to lose a little weight       Weight goal 225lb Portion control       Depression Screen    07/05/2022   12:36 PM 03/15/2022    8:41 AM 09/11/2021    8:43 AM 07/03/2021    9:07 AM 06/30/2020    9:29 AM 06/30/2019    9:18 AM 02/12/2019    3:09 PM  PHQ 2/9 Scores  PHQ - 2 Score 0 0 0 0 0 0 0    Fall  Risk    07/01/2022    9:09 AM 03/15/2022    8:41 AM 09/11/2021    8:43 AM 07/03/2021    9:10 AM 06/30/2020    9:29 AM  Fall Risk   Falls in the past year? 0 0 0 0 0  Number falls in past yr:  0  0 0  Injury with Fall?  0   0  Risk for fall due to :  No Fall Risks No Fall Risks    Follow up Falls evaluation completed;Falls prevention discussed Falls evaluation completed Falls evaluation completed Falls evaluation completed Falls evaluation completed    FALL RISK PREVENTION PERTAINING TO THE HOME: Home free of loose throw rugs in walkways, pet beds, electrical cords, etc? Yes  Adequate lighting in your home to reduce risk of falls? Yes   ASSISTIVE DEVICES UTILIZED TO PREVENT FALLS: Life alert? No  Use of a cane, walker or w/c? No  Grab bars in the bathroom? Yes  Shower chair or bench in shower? No  Elevated toilet seat or a handicapped toilet? No   TIMED UP AND GO: Was the test performed? No .    Cognitive Function:    10/07/2017    5:05 PM 09/13/2016    1:49 PM 09/14/2015    4:04 PM  MMSE - Mini Mental State Exam  Orientation to time '5 5 5  '$ Orientation to Place '5 5 5  '$ Registration '3 3 3  '$ Attention/ Calculation '5 5 5  '$ Recall '3 3 3  '$ Language- name 2 objects '2 2 2  '$ Language- repeat '1 1 1  '$ Language- follow 3 step command '3 3 3  '$ Language- read & follow direction '1 1 1  '$ Write a sentence '1 1 1  '$ Copy design 1 1  1  Total score '30 30 30        '$ 07/05/2022   12:44 PM 06/30/2019    9:36 AM  6CIT Screen  What Year? 0 points 0 points  What month? 0 points 0 points  What time? 0 points 0 points  Count back from 20 0 points 0 points  Months in reverse 0 points 0 points  Repeat phrase 0 points   Total Score 0 points     Immunizations Immunization History  Administered Date(s) Administered   Fluad Quad(high Dose 65+) 02/03/2019, 03/30/2019, 03/03/2021, 01/18/2022   Influenza Split 03/02/2013   Influenza, High Dose Seasonal PF 02/03/2016, 02/15/2017, 02/25/2018    Influenza,inj,Quad PF,6+ Mos 01/22/2014, 01/31/2015   Influenza-Unspecified 02/25/2018   Janssen (J&J) SARS-COV-2 Vaccination 07/12/2019, 04/08/2020   Moderna Covid-19 Vaccine Bivalent Booster 60yr & up 03/14/2021   Pneumococcal Conjugate-13 07/22/2013   Pneumococcal Polysaccharide-23 02/11/2015   Tdap 12/30/2015   Covid-19 vaccine status: Completed vaccines x3.  Shingrix Completed?: No.    Education has been provided regarding the importance of this vaccine. Patient has been advised to call insurance company to determine out of pocket expense if they have not yet received this vaccine. Advised may also receive vaccine at local pharmacy or Health Dept. Verbalized acceptance and understanding.  Screening Tests Health Maintenance  Topic Date Due   COVID-19 Vaccine (4 - 2023-24 season) 07/21/2022 (Originally 12/29/2021)   COLONOSCOPY (Pts 45-459yrInsurance coverage will need to be confirmed)  09/13/2022 (Originally 12/29/2020)   Zoster Vaccines- Shingrix (1 of 2) 10/05/2022 (Originally 01/16/1997)   Medicare Annual Wellness (AWV)  07/05/2023   DTaP/Tdap/Td (2 - Td or Tdap) 12/29/2025   Pneumonia Vaccine 6548Years old  Completed   INFLUENZA VACCINE  Completed   Hepatitis C Screening  Completed   HPV VACCINES  Aged Out    Health Maintenance There are no preventive care reminders to display for this patient.  Lung Cancer Screening: (Low Dose CT Chest recommended if Age 76-80ears, 30 pack-year currently smoking OR have quit w/in 15years.) does not qualify.   Hepatitis C Screening: Completed 08/2016.   Vision Screening: Recommended annual ophthalmology exams for early detection of glaucoma and other disorders of the eye.  Dental Screening: Recommended annual dental exams for proper oral hygiene  Community Resource Referral / Chronic Care Management: CRR required this visit?  No   CCM required this visit?  No      Plan:     I have personally reviewed and noted the following in the  patient's chart:   Medical and social history Use of alcohol, tobacco or illicit drugs  Current medications and supplements including opioid prescriptions. Patient is not currently taking opioid prescriptions. Functional ability and status Nutritional status Physical activity Advanced directives List of other physicians Hospitalizations, surgeries, and ER visits in previous 12 months Vitals Screenings to include cognitive, depression, and falls Referrals and appointments  In addition, I have reviewed and discussed with patient certain preventive protocols, quality metrics, and best practice recommendations. A written personalized care plan for preventive services as well as general preventive health recommendations were provided to patient.     DeLeta JunglingLPN   [id number]

## 2022-07-06 DIAGNOSIS — H04123 Dry eye syndrome of bilateral lacrimal glands: Secondary | ICD-10-CM | POA: Diagnosis not present

## 2022-07-06 DIAGNOSIS — D3101 Benign neoplasm of right conjunctiva: Secondary | ICD-10-CM | POA: Diagnosis not present

## 2022-07-16 ENCOUNTER — Telehealth: Payer: Self-pay | Admitting: Gastroenterology

## 2022-07-16 NOTE — Telephone Encounter (Signed)
Procedure canceled

## 2022-07-16 NOTE — Telephone Encounter (Signed)
Patient would like to cancel colonoscopy. Does not want to reschedule.

## 2022-08-06 ENCOUNTER — Ambulatory Visit: Admission: RE | Admit: 2022-08-06 | Payer: Medicare Other | Source: Home / Self Care | Admitting: Gastroenterology

## 2022-08-06 SURGERY — COLONOSCOPY WITH PROPOFOL
Anesthesia: Choice

## 2022-08-24 DIAGNOSIS — H903 Sensorineural hearing loss, bilateral: Secondary | ICD-10-CM | POA: Diagnosis not present

## 2022-09-14 ENCOUNTER — Ambulatory Visit: Payer: Medicare Other | Admitting: Internal Medicine

## 2022-09-17 ENCOUNTER — Other Ambulatory Visit: Payer: Self-pay | Admitting: Internal Medicine

## 2022-09-18 ENCOUNTER — Other Ambulatory Visit (HOSPITAL_COMMUNITY): Payer: Self-pay

## 2022-09-18 NOTE — Telephone Encounter (Signed)
PA is needed

## 2022-09-18 NOTE — Telephone Encounter (Signed)
Per test claim, prescription was filled through insurance 09/17/2022, please sign off on encounter as PA team is unable to resolve Rx Requests, thanks

## 2022-09-27 DIAGNOSIS — H903 Sensorineural hearing loss, bilateral: Secondary | ICD-10-CM | POA: Diagnosis not present

## 2022-10-01 ENCOUNTER — Other Ambulatory Visit: Payer: Self-pay | Admitting: Internal Medicine

## 2022-11-13 ENCOUNTER — Ambulatory Visit: Payer: Medicare Other | Admitting: Internal Medicine

## 2022-12-13 ENCOUNTER — Encounter (INDEPENDENT_AMBULATORY_CARE_PROVIDER_SITE_OTHER): Payer: Self-pay

## 2023-01-10 ENCOUNTER — Other Ambulatory Visit: Payer: Self-pay | Admitting: Internal Medicine

## 2023-01-11 ENCOUNTER — Ambulatory Visit (INDEPENDENT_AMBULATORY_CARE_PROVIDER_SITE_OTHER): Payer: Medicare Other | Admitting: Internal Medicine

## 2023-01-11 ENCOUNTER — Encounter: Payer: Self-pay | Admitting: Internal Medicine

## 2023-01-11 VITALS — BP 128/74 | HR 62 | Temp 98.7°F | Ht 69.0 in | Wt 228.4 lb

## 2023-01-11 DIAGNOSIS — K219 Gastro-esophageal reflux disease without esophagitis: Secondary | ICD-10-CM

## 2023-01-11 DIAGNOSIS — R7989 Other specified abnormal findings of blood chemistry: Secondary | ICD-10-CM

## 2023-01-11 DIAGNOSIS — G2581 Restless legs syndrome: Secondary | ICD-10-CM

## 2023-01-11 DIAGNOSIS — Z86010 Personal history of colonic polyps: Secondary | ICD-10-CM | POA: Diagnosis not present

## 2023-01-11 DIAGNOSIS — M810 Age-related osteoporosis without current pathological fracture: Secondary | ICD-10-CM | POA: Diagnosis not present

## 2023-01-11 DIAGNOSIS — G4730 Sleep apnea, unspecified: Secondary | ICD-10-CM

## 2023-01-11 DIAGNOSIS — E7800 Pure hypercholesterolemia, unspecified: Secondary | ICD-10-CM

## 2023-01-11 DIAGNOSIS — Z23 Encounter for immunization: Secondary | ICD-10-CM

## 2023-01-11 DIAGNOSIS — Z125 Encounter for screening for malignant neoplasm of prostate: Secondary | ICD-10-CM

## 2023-01-11 DIAGNOSIS — J4520 Mild intermittent asthma, uncomplicated: Secondary | ICD-10-CM | POA: Diagnosis not present

## 2023-01-11 LAB — HEPATIC FUNCTION PANEL
ALT: 14 U/L (ref 0–53)
AST: 46 U/L — ABNORMAL HIGH (ref 0–37)
Albumin: 3.8 g/dL (ref 3.5–5.2)
Alkaline Phosphatase: 72 U/L (ref 39–117)
Bilirubin, Direct: 0.2 mg/dL (ref 0.0–0.3)
Total Bilirubin: 0.8 mg/dL (ref 0.2–1.2)
Total Protein: 6.7 g/dL (ref 6.0–8.3)

## 2023-01-11 LAB — LIPID PANEL
Cholesterol: 142 mg/dL (ref 0–200)
HDL: 49.4 mg/dL (ref >39.00)
LDL Cholesterol: 76 mg/dL (ref 0–99)
NonHDL: 92.21
Total CHOL/HDL Ratio: 3
Triglycerides: 79 mg/dL (ref 0.0–149.0)
VLDL: 15.8 mg/dL (ref 0.0–40.0)

## 2023-01-11 LAB — CBC WITH DIFFERENTIAL/PLATELET
Basophils Absolute: 0 10*3/uL (ref 0.0–0.1)
Basophils Relative: 1 % (ref 0.0–3.0)
Eosinophils Absolute: 0.1 10*3/uL (ref 0.0–0.7)
Eosinophils Relative: 2.6 % (ref 0.0–5.0)
HCT: 45.1 % (ref 39.0–52.0)
Hemoglobin: 15 g/dL (ref 13.0–17.0)
Lymphocytes Relative: 27.7 % (ref 12.0–46.0)
Lymphs Abs: 1.4 10*3/uL (ref 0.7–4.0)
MCHC: 33.2 g/dL (ref 30.0–36.0)
MCV: 96.4 fl (ref 78.0–100.0)
Monocytes Absolute: 0.7 10*3/uL (ref 0.1–1.0)
Monocytes Relative: 13.4 % — ABNORMAL HIGH (ref 3.0–12.0)
Neutro Abs: 2.7 10*3/uL (ref 1.4–7.7)
Neutrophils Relative %: 55.3 % (ref 43.0–77.0)
Platelets: 217 10*3/uL (ref 150.0–400.0)
RBC: 4.68 Mil/uL (ref 4.22–5.81)
RDW: 13.2 % (ref 11.5–15.5)
WBC: 5 10*3/uL (ref 4.0–10.5)

## 2023-01-11 LAB — BASIC METABOLIC PANEL
BUN: 18 mg/dL (ref 6–23)
CO2: 33 meq/L — ABNORMAL HIGH (ref 19–32)
Calcium: 9 mg/dL (ref 8.4–10.5)
Chloride: 100 meq/L (ref 96–112)
Creatinine, Ser: 1.1 mg/dL (ref 0.40–1.50)
GFR: 65.45 mL/min (ref >60.00)
Glucose, Bld: 76 mg/dL (ref 70–99)
Potassium: 3.8 meq/L (ref 3.5–5.1)
Sodium: 139 meq/L (ref 135–145)

## 2023-01-11 LAB — TSH: TSH: 3.26 u[IU]/mL (ref 0.35–5.50)

## 2023-01-11 LAB — PSA, MEDICARE: PSA: 1.39 ng/mL (ref 0.10–4.00)

## 2023-01-11 MED ORDER — PRAVASTATIN SODIUM 40 MG PO TABS: 40.0000 mg | ORAL_TABLET | Freq: Every day | ORAL | 1 refills | Status: DC

## 2023-01-11 MED ORDER — OMEPRAZOLE 20 MG PO CPDR: 20.0000 mg | DELAYED_RELEASE_CAPSULE | Freq: Two times a day (BID) | ORAL | 3 refills | Status: DC

## 2023-01-11 MED ORDER — FLUTICASONE FUROATE-VILANTEROL 100-25 MCG/ACT IN AEPB: 1.0000 | INHALATION_SPRAY | Freq: Every day | RESPIRATORY_TRACT | 3 refills | Status: DC

## 2023-01-11 MED ORDER — PRAMIPEXOLE DIHYDROCHLORIDE 0.5 MG PO TABS: ORAL_TABLET | ORAL | 1 refills | Status: DC

## 2023-01-11 MED ORDER — FINASTERIDE 5 MG PO TABS: 5.0000 mg | ORAL_TABLET | Freq: Every day | ORAL | 3 refills | Status: DC

## 2023-01-11 NOTE — Assessment & Plan Note (Signed)
Seeing Dr Sherene Sires.  Breathing stable.  Continue Breo.

## 2023-01-11 NOTE — Assessment & Plan Note (Signed)
Discussed f/u colonoscopy.  Declines.  Will notify me if changes her mind.

## 2023-01-11 NOTE — Assessment & Plan Note (Signed)
Continue diet and exercise.  Follow liver function tests.

## 2023-01-11 NOTE — Assessment & Plan Note (Signed)
Continue omeprazole.  Upper symptoms controlled.

## 2023-01-11 NOTE — Assessment & Plan Note (Signed)
Continue statin medication.  Low cholesterol diet and exercise.  Follow lipid panel and liver function tests.

## 2023-01-11 NOTE — Assessment & Plan Note (Signed)
Continue weight bearing exercise.  Check vitamin d level. Consider f/u bone density.

## 2023-01-11 NOTE — Assessment & Plan Note (Signed)
Doing well on mirapex.  Follow.

## 2023-01-11 NOTE — Progress Notes (Signed)
Subjective:    Patient ID: Joseph Good., male    DOB: August 06, 1945, 77 y.o.   MRN: [id number]  Patient here for  Chief Complaint  Patient presents with   Medication Management    HPI Here to follow up regarding hypercholesterolemia, asthma and abnormal liver function tests.  Continues Breo.  Uses cpap regularly. Breathing stable.  No chest pain or sob reported.  No abdominal pain reported.  Had a couple days this week of constipation, but this has resolved. Discussed benefiber to keep bowels moving. Overall he feels he is doing well and that things are stable.    Past Medical History:  Diagnosis Date   Asthma    Bladder outlet obstruction    Crohn's disease ([facility])    appendix, s/p appendectomy   GERD (gastroesophageal reflux disease)    schatzki ring   Hypercholesterolemia    Restless leg syndrome    Seizure disorder [facility])    age 25 & 8, previously on phenobarbital   Skin cancer, basal cell    Sleep apnea    CPAP   Past Surgical History:  Procedure Laterality Date   APPENDECTOMY  1992   lysis of adhesions (bowel blockage)   inguinal hernia surgery  1999   left   KNEE ARTHROSCOPY  1982   NASAL POLYP SURGERY  1963   TONSILLECTOMY AND ADENOIDECTOMY  1956   Family History  Problem Relation Age of Onset   Heart disease Father    Congestive Heart Failure Mother    Colon cancer Neg Hx    Prostate cancer Neg Hx    Social History   Socioeconomic History   Marital status: Married    Spouse name: Not on file   Number of children: 0   Years of education: Not on file   Highest education level: Associate degree: occupational, Scientist, product/process development, or vocational program  Occupational History   Not on file  Tobacco Use   Smoking status: Never   Smokeless tobacco: Never  Vaping Use   Vaping status: Never Used  Substance and Sexual Activity   Alcohol use: No    Alcohol/week: 0.0 standard drinks of alcohol   Drug use: No   Sexual activity: Not Currently  Other Topics  Concern   Not on file  Social History Narrative   Not on file   Social Determinants of Health   Financial Resource Strain: Low Risk  (01/07/2023)   Overall Financial Resource Strain (CARDIA)    Difficulty of Paying Living Expenses: Not hard at all  Food Insecurity: No Food Insecurity (01/07/2023)   Hunger Vital Sign    Worried About Running Out of Food in the Last Year: Never true    Ran Out of Food in the Last Year: Never true  Transportation Needs: No Transportation Needs (01/07/2023)   PRAPARE - Administrator, Civil Service (Medical): No    Lack of Transportation (Non-Medical): No  Physical Activity: Insufficiently Active (01/07/2023)   Exercise Vital Sign    Days of Exercise per Week: 3 days    Minutes of Exercise per Session: 20 min  Stress: No Stress Concern Present (01/07/2023)   [employer] of Occupational Health - Occupational Stress Questionnaire    Feeling of Stress : Not at all  Social Connections: Socially Integrated (01/07/2023)   Social Connection and Isolation Panel [NHANES]    Frequency of Communication with Friends and Family: More than three times a week    Frequency of Social Gatherings with  Friends and Family: Twice a week    Attends Religious Services: More than 4 times per year    Active Member of Clubs or Organizations: Yes    Attends Engineer, structural: More than 4 times per year    Marital Status: Married     Review of Systems  Constitutional:  Negative for appetite change and unexpected weight change.  HENT:  Negative for congestion and sinus pressure.   Respiratory:  Negative for cough, chest tightness and shortness of breath.   Cardiovascular:  Negative for chest pain, palpitations and leg swelling.  Gastrointestinal:  Negative for abdominal pain, diarrhea, nausea and vomiting.  Genitourinary:  Negative for difficulty urinating and dysuria.  Musculoskeletal:  Negative for joint swelling and myalgias.  Skin:  Negative for color  change and rash.  Neurological:  Negative for dizziness and headaches.  Psychiatric/Behavioral:  Negative for agitation and dysphoric mood.        Objective:     BP 128/74   Pulse 62   Temp 98.7 F (37.1 C) (Temporal)   Ht 5\' 9"  (1.753 m)   Wt 228 lb 6.4 oz (103.6 kg)   SpO2 100%   BMI 33.73 kg/m  Wt Readings from Last 3 Encounters:  01/11/23 228 lb 6.4 oz (103.6 kg)  07/05/22 232 lb (105.2 kg)  03/15/22 232 lb 9.6 oz (105.5 kg)    Physical Exam Vitals reviewed.  Constitutional:      General: He is not in acute distress.    Appearance: Normal appearance. He is well-developed.  HENT:     Head: Normocephalic and atraumatic.     Right Ear: External ear normal.     Left Ear: External ear normal.  Eyes:     General: No scleral icterus.       Right eye: No discharge.        Left eye: No discharge.     Conjunctiva/sclera: Conjunctivae normal.  Cardiovascular:     Rate and Rhythm: Normal rate and regular rhythm.  Pulmonary:     Effort: Pulmonary effort is normal. No respiratory distress.     Breath sounds: Normal breath sounds.  Abdominal:     General: Bowel sounds are normal.     Palpations: Abdomen is soft.     Tenderness: There is no abdominal tenderness.  Musculoskeletal:        General: No swelling or tenderness.     Cervical back: Neck supple. No tenderness.  Lymphadenopathy:     Cervical: No cervical adenopathy.  Skin:    Findings: No erythema or rash.  Neurological:     Mental Status: He is alert.  Psychiatric:        Mood and Affect: Mood normal.        Behavior: Behavior normal.      Outpatient Encounter Medications as of 01/11/2023  Medication Sig   albuterol (VENTOLIN HFA) 108 (90 Base) MCG/ACT inhaler Inhale 2 puffs into the lungs every 6 (six) hours as needed for wheezing or shortness of breath.   CALCIUM CITRATE PO Take 1 tablet by mouth daily.   Cholecalciferol (VITAMIN D-3) 1000 UNITS CAPS Take 1 capsule by mouth daily.   Coenzyme Q10  (COQ10) 100 MG CAPS Take 100 mg by mouth daily.   finasteride (PROSCAR) 5 MG tablet Take 1 tablet (5 mg total) by mouth daily.   fluticasone furoate-vilanterol (BREO ELLIPTA) 100-25 MCG/ACT AEPB Inhale 1 puff into the lungs daily.   Magnesium 250 MG TABS Two tablets q  day   Multiple Vitamins-Minerals (CENTRUM SILVER 50+MEN PO) Take 1 tablet by mouth daily.   omeprazole (PRILOSEC) 20 MG capsule Take 1 capsule (20 mg total) by mouth 2 (two) times daily before a meal.   pramipexole (MIRAPEX) 0.5 MG tablet TAKE 1 TABLET BY MOUTH EVERYDAY AT BEDTIME   pravastatin (PRAVACHOL) 40 MG tablet Take 1 tablet (40 mg total) by mouth daily.   [DISCONTINUED] finasteride (PROSCAR) 5 MG tablet TAKE 1 TABLET (5 MG TOTAL) BY MOUTH DAILY.   [DISCONTINUED] fluticasone furoate-vilanterol (BREO ELLIPTA) 100-25 MCG/ACT AEPB Inhale 1 puff into the lungs daily.   [DISCONTINUED] omeprazole (PRILOSEC) 20 MG capsule TAKE 1 CAPSULE (20 MG TOTAL) BY MOUTH 2 (TWO) TIMES DAILY BEFORE A MEAL.   [DISCONTINUED] pramipexole (MIRAPEX) 0.5 MG tablet TAKE 1 TABLET BY MOUTH EVERYDAY AT BEDTIME   [DISCONTINUED] pravastatin (PRAVACHOL) 40 MG tablet TAKE 1 TABLET BY MOUTH EVERY DAY   No facility-administered encounter medications on file as of 01/11/2023.     Lab Results  Component Value Date   WBC 6.1 12/08/2021   HGB 15.7 12/08/2021   HCT 46.2 12/08/2021   PLT 214 12/08/2021   GLUCOSE 89 03/30/2022   CHOL 176 03/30/2022   TRIG 111.0 03/30/2022   HDL 49.30 03/30/2022   LDLDIRECT 154.7 05/22/2013   LDLCALC 105 (H) 03/30/2022   ALT 16 03/30/2022   AST 47 (H) 03/30/2022   NA 141 03/30/2022   K 4.3 03/30/2022   CL 102 03/30/2022   CREATININE 1.20 03/30/2022   BUN 15 03/30/2022   CO2 33 (H) 03/30/2022   TSH 2.41 03/30/2022   PSA 1.10 09/28/2021   INR 1.00 11/17/2016    DG Chest 2 View  Result Date: 12/08/2021 CLINICAL DATA:  77 year old male with cough EXAM: CHEST - 2 VIEW COMPARISON:  09/11/2021 FINDINGS:  Cardiomediastinal silhouette unchanged in size and contour. Low lung volumes persist. No evidence of central vascular congestion. No interlobular septal thickening. No pneumothorax or pleural effusion. Coarsened interstitial markings, with no confluent airspace disease. No acute displaced fracture. Degenerative changes of the spine. IMPRESSION: No active cardiopulmonary disease. Electronically Signed   By: Gilmer Mor D.O.   On: 12/08/2021 16:34       Assessment & Plan:  Hypercholesterolemia Assessment & Plan: Continue statin medication.  Low cholesterol diet and exercise.  Follow lipid panel and liver function tests.    Orders: -     CBC with Differential/Platelet -     Basic metabolic panel -     Lipid panel -     Hepatic function panel -     TSH  Prostate cancer screening -     PSA, Medicare  Encounter for immunization -     Flu Vaccine Trivalent High Dose (Fluad)  Abnormal liver function test Assessment & Plan: Continue diet and exercise.  Follow liver function tests.    Mild intermittent asthma without complication Assessment & Plan: Seeing Dr Sherene Sires.  Breathing stable.  Continue Breo.    Gastroesophageal reflux disease, unspecified whether esophagitis present Assessment & Plan: Continue omeprazole.  Upper symptoms controlled.    History of adenomatous polyp of colon Assessment & Plan: Discussed f/u colonoscopy.  Declines.  Will notify me if changes her mind.    Osteoporosis, unspecified osteoporosis type, unspecified pathological fracture presence Assessment & Plan: Continue weight bearing exercise.  Check vitamin d level. Consider f/u bone density.    Restless leg syndrome Assessment & Plan: Doing well on mirapex.  Follow.  Sleep apnea, unspecified type Assessment & Plan: Using cpap.  Compliant.     Other orders -     Finasteride; Take 1 tablet (5 mg total) by mouth daily.  Dispense: 90 tablet; Refill: 3 -     Fluticasone Furoate-Vilanterol; Inhale  1 puff into the lungs daily.  Dispense: 90 each; Refill: 3 -     Omeprazole; Take 1 capsule (20 mg total) by mouth 2 (two) times daily before a meal.  Dispense: 180 capsule; Refill: 3 -     Pramipexole Dihydrochloride; TAKE 1 TABLET BY MOUTH EVERYDAY AT BEDTIME  Dispense: 90 tablet; Refill: 1 -     Pravastatin Sodium; Take 1 tablet (40 mg total) by mouth daily.  Dispense: 90 tablet; Refill: 1     Dale Conde, MD

## 2023-01-11 NOTE — Assessment & Plan Note (Signed)
Using cpap.  Compliant.

## 2023-03-26 DIAGNOSIS — Z872 Personal history of diseases of the skin and subcutaneous tissue: Secondary | ICD-10-CM | POA: Diagnosis not present

## 2023-03-26 DIAGNOSIS — L578 Other skin changes due to chronic exposure to nonionizing radiation: Secondary | ICD-10-CM | POA: Diagnosis not present

## 2023-03-26 DIAGNOSIS — L570 Actinic keratosis: Secondary | ICD-10-CM | POA: Diagnosis not present

## 2023-03-26 DIAGNOSIS — Z859 Personal history of malignant neoplasm, unspecified: Secondary | ICD-10-CM | POA: Diagnosis not present

## 2023-07-16 ENCOUNTER — Ambulatory Visit: Payer: Medicare Other | Admitting: Internal Medicine

## 2023-07-16 VITALS — BP 122/70 | HR 63 | Temp 98.0°F | Resp 16 | Ht 69.0 in | Wt 228.0 lb

## 2023-07-16 DIAGNOSIS — M810 Age-related osteoporosis without current pathological fracture: Secondary | ICD-10-CM | POA: Diagnosis not present

## 2023-07-16 DIAGNOSIS — G2581 Restless legs syndrome: Secondary | ICD-10-CM

## 2023-07-16 DIAGNOSIS — K219 Gastro-esophageal reflux disease without esophagitis: Secondary | ICD-10-CM

## 2023-07-16 DIAGNOSIS — Z23 Encounter for immunization: Secondary | ICD-10-CM

## 2023-07-16 DIAGNOSIS — G4730 Sleep apnea, unspecified: Secondary | ICD-10-CM | POA: Diagnosis not present

## 2023-07-16 DIAGNOSIS — E7800 Pure hypercholesterolemia, unspecified: Secondary | ICD-10-CM

## 2023-07-16 DIAGNOSIS — Z0000 Encounter for general adult medical examination without abnormal findings: Secondary | ICD-10-CM

## 2023-07-16 LAB — LIPID PANEL
Cholesterol: 165 mg/dL (ref 0–200)
HDL: 47.5 mg/dL (ref >39.00)
LDL Cholesterol: 98 mg/dL (ref 0–99)
NonHDL: 117.3
Total CHOL/HDL Ratio: 3
Triglycerides: 99 mg/dL (ref 0.0–149.0)
VLDL: 19.8 mg/dL (ref 0.0–40.0)

## 2023-07-16 LAB — BASIC METABOLIC PANEL
BUN: 18 mg/dL (ref 6–23)
CO2: 31 meq/L (ref 19–32)
Calcium: 9.3 mg/dL (ref 8.4–10.5)
Chloride: 100 meq/L (ref 96–112)
Creatinine, Ser: 1.08 mg/dL (ref 0.40–1.50)
GFR: 66.67 mL/min (ref >60.00)
Glucose, Bld: 87 mg/dL (ref 70–99)
Potassium: 4 meq/L (ref 3.5–5.1)
Sodium: 139 meq/L (ref 135–145)

## 2023-07-16 LAB — HEPATIC FUNCTION PANEL
ALT: 14 U/L (ref 0–53)
AST: 45 U/L — ABNORMAL HIGH (ref 0–37)
Albumin: 4.1 g/dL (ref 3.5–5.2)
Alkaline Phosphatase: 67 U/L (ref 39–117)
Bilirubin, Direct: 0.2 mg/dL (ref 0.0–0.3)
Total Bilirubin: 0.8 mg/dL (ref 0.2–1.2)
Total Protein: 6.9 g/dL (ref 6.0–8.3)

## 2023-07-16 LAB — VITAMIN D 25 HYDROXY (VIT D DEFICIENCY, FRACTURES): VITD: 48.3 ng/mL (ref 30.00–100.00)

## 2023-07-16 NOTE — Progress Notes (Signed)
 Subjective:    Patient ID: Joseph Good., male    DOB: [birth year], 78 y.o.   MRN: [id number]  Patient here for  Chief Complaint  Patient presents with   Annual Exam    HPI Here for a physical exam. Continues Breo. Uses cpap. Breathing has been stable. Continues on omeprazole. No upper symptoms reported. No chest pain or sob reported. No cough or congestion. Breathing stable. No abdominal pain or bowel change reported. Has questions about his cpap. His switch on his machine has broken. He still uses the machine, but plugs in. Was questioning getting a new machine. Uses his cpap regularly. Feels better when sleeps with the cpap. Is agreeable to a bone density.    Past Medical History:  Diagnosis Date   Asthma    Bladder outlet obstruction    Crohn's disease (HCC)    appendix, s/p appendectomy   GERD (gastroesophageal reflux disease)    schatzki ring   Hypercholesterolemia    Restless leg syndrome    Seizure disorder [facility])    age 66 & 59, previously on phenobarbital   Skin cancer, basal cell    Sleep apnea    CPAP   Past Surgical History:  Procedure Laterality Date   APPENDECTOMY  1992   lysis of adhesions (bowel blockage)   inguinal hernia surgery  1999   left   KNEE ARTHROSCOPY  1982   NASAL POLYP SURGERY  1963   TONSILLECTOMY AND ADENOIDECTOMY  1956   Family History  Problem Relation Age of Onset   Heart disease Father    Congestive Heart Failure Mother    Colon cancer Neg Hx    Prostate cancer Neg Hx    Social History   Socioeconomic History   Marital status: Married    Spouse name: Not on file   Number of children: 0   Years of education: Not on file   Highest education level: Associate degree: academic program  Occupational History   Not on file  Tobacco Use   Smoking status: Never   Smokeless tobacco: Never  Vaping Use   Vaping status: Never Used  Substance and Sexual Activity   Alcohol use: No    Alcohol/week: 0.0 standard drinks of alcohol    Drug use: No   Sexual activity: Not Currently  Other Topics Concern   Not on file  Social History Narrative   Married   Social Drivers of Health   Financial Resource Strain: Low Risk  (07/19/2023)   Overall Financial Resource Strain (CARDIA)    Difficulty of Paying Living Expenses: Not hard at all  Food Insecurity: No Food Insecurity (07/19/2023)   Hunger Vital Sign    Worried About Running Out of Food in the Last Year: Never true    Ran Out of Food in the Last Year: Never true  Transportation Needs: No Transportation Needs (07/19/2023)   PRAPARE - Administrator, Civil Service (Medical): No    Lack of Transportation (Non-Medical): No  Physical Activity: Inactive (07/19/2023)   Exercise Vital Sign    Days of Exercise per Week: 1 day    Minutes of Exercise per Session: 0 min  Stress: No Stress Concern Present (07/19/2023)   [employer] of Occupational Health - Occupational Stress Questionnaire    Feeling of Stress : Not at all  Social Connections: Socially Integrated (07/19/2023)   Social Connection and Isolation Panel [NHANES]    Frequency of Communication with Friends and Family: More than  three times a week    Frequency of Social Gatherings with Friends and Family: Twice a week    Attends Religious Services: More than 4 times per year    Active Member of [employer] or Organizations: Yes    Attends Engineer, structural: More than 4 times per year    Marital Status: Married     Review of Systems  Constitutional:  Negative for appetite change and unexpected weight change.  HENT:  Negative for congestion, sinus pressure and sore throat.   Eyes:  Negative for pain and visual disturbance.  Respiratory:  Negative for cough, chest tightness and shortness of breath.   Cardiovascular:  Negative for chest pain, palpitations and leg swelling.  Gastrointestinal:  Negative for abdominal pain, diarrhea, nausea and vomiting.  Genitourinary:  Negative for difficulty  urinating and dysuria.  Musculoskeletal:  Negative for joint swelling and myalgias.  Skin:  Negative for color change and rash.  Neurological:  Negative for dizziness and headaches.  Hematological:  Negative for adenopathy. Does not bruise/bleed easily.  Psychiatric/Behavioral:  Negative for agitation and dysphoric mood.        Objective:     BP 122/70   Pulse 63   Temp 98 F (36.7 C)   Resp 16   Ht 5\' 9"  (1.753 m)   Wt 228 lb (103.4 kg)   SpO2 98%   BMI 33.67 kg/m  Wt Readings from Last 3 Encounters:  07/19/23 225 lb (102.1 kg)  07/16/23 228 lb (103.4 kg)  01/11/23 228 lb 6.4 oz (103.6 kg)    Physical Exam Constitutional:      General: He is not in acute distress.    Appearance: Normal appearance. He is well-developed.  HENT:     Head: Normocephalic and atraumatic.     Right Ear: External ear normal.     Left Ear: External ear normal.     Mouth/Throat:     Pharynx: No oropharyngeal exudate or posterior oropharyngeal erythema.  Eyes:     General: No scleral icterus.       Right eye: No discharge.        Left eye: No discharge.     Conjunctiva/sclera: Conjunctivae normal.  Neck:     Thyroid: No thyromegaly.  Cardiovascular:     Rate and Rhythm: Normal rate and regular rhythm.  Pulmonary:     Effort: No respiratory distress.     Breath sounds: Normal breath sounds. No wheezing.  Abdominal:     General: Bowel sounds are normal.     Palpations: Abdomen is soft.     Tenderness: There is no abdominal tenderness.  Musculoskeletal:        General: No swelling or tenderness.     Cervical back: Neck supple. No tenderness.  Lymphadenopathy:     Cervical: No cervical adenopathy.  Skin:    Findings: No erythema or rash.  Neurological:     Mental Status: He is alert and oriented to person, place, and time.  Psychiatric:        Mood and Affect: Mood normal.        Behavior: Behavior normal.         Outpatient Encounter Medications as of 07/16/2023  Medication  Sig   albuterol (VENTOLIN HFA) 108 (90 Base) MCG/ACT inhaler Inhale 2 puffs into the lungs every 6 (six) hours as needed for wheezing or shortness of breath.   CALCIUM CITRATE PO Take 1 tablet by mouth daily.   Cholecalciferol (VITAMIN D-3) 1000  UNITS CAPS Take 1 capsule by mouth daily.   Coenzyme Q10 (COQ10) 100 MG CAPS Take 100 mg by mouth daily.   finasteride (PROSCAR) 5 MG tablet Take 1 tablet (5 mg total) by mouth daily.   fluticasone furoate-vilanterol (BREO ELLIPTA) 100-25 MCG/ACT AEPB Inhale 1 puff into the lungs daily.   Magnesium 250 MG TABS Two tablets q day   Multiple Vitamins-Minerals (CENTRUM SILVER 50+MEN PO) Take 1 tablet by mouth daily.   omeprazole (PRILOSEC) 20 MG capsule Take 1 capsule (20 mg total) by mouth 2 (two) times daily before a meal.   pramipexole (MIRAPEX) 0.5 MG tablet TAKE 1 TABLET BY MOUTH EVERYDAY AT BEDTIME   pravastatin (PRAVACHOL) 40 MG tablet Take 1 tablet (40 mg total) by mouth daily.   No facility-administered encounter medications on file as of 07/16/2023.     Lab Results  Component Value Date   WBC 5.0 01/11/2023   HGB 15.0 01/11/2023   HCT 45.1 01/11/2023   PLT 217.0 01/11/2023   GLUCOSE 87 07/16/2023   CHOL 165 07/16/2023   TRIG 99.0 07/16/2023   HDL 47.50 07/16/2023   LDLDIRECT 154.7 05/22/2013   LDLCALC 98 07/16/2023   ALT 14 07/16/2023   AST 45 (H) 07/16/2023   NA 139 07/16/2023   K 4.0 07/16/2023   CL 100 07/16/2023   CREATININE 1.08 07/16/2023   BUN 18 07/16/2023   CO2 31 07/16/2023   TSH 3.26 01/11/2023   PSA 1.39 01/11/2023   INR 1.00 11/17/2016    DG Chest 2 View Result Date: 12/08/2021 CLINICAL DATA:  78 year old male with cough EXAM: CHEST - 2 VIEW COMPARISON:  09/11/2021 FINDINGS: Cardiomediastinal silhouette unchanged in size and contour. Low lung volumes persist. No evidence of central vascular congestion. No interlobular septal thickening. No pneumothorax or pleural effusion. Coarsened interstitial markings, with no  confluent airspace disease. No acute displaced fracture. Degenerative changes of the spine. IMPRESSION: No active cardiopulmonary disease. Electronically Signed   By: Gilmer Mor D.O.   On: 12/08/2021 16:34       Assessment & Plan:  Routine general medical examination at a health care facility  Osteoporosis, unspecified osteoporosis type, unspecified pathological fracture presence Assessment & Plan: Continue weight bearing exercise.  Check vitamin d level.  Agreeable to f/u bone density.   Orders: -     VITAMIN D 25 Hydroxy (Vit-D Deficiency, Fractures) -     DG Bone Density; Future  Hypercholesterolemia Assessment & Plan: Continue statin medication.  Low cholesterol diet and exercise.  Follow lipid panel and liver function tests.    Orders: -     Basic metabolic panel -     Hepatic function panel -     Lipid panel  Need for shingles vaccine  Sleep apnea, unspecified type Assessment & Plan: Uses cpap regularly. Benefits from use. Has questions about his cpap. His switch on his machine has broken. He still uses the machine, but plugs in. Was questioning getting a new machine. Uses his cpap regularly. Feels better when sleeps with the cpap.    Restless leg syndrome Assessment & Plan: Doing well on mirapex.  Follow.     Healthcare maintenance Assessment & Plan: Physical today 07/16/23.  Follow psa. (12/2022 - 1.39).  Colonoscopy - need results of last. (Was scheduled for 07/2022).    Gastroesophageal reflux disease, unspecified whether esophagitis present Assessment & Plan: Continue omeprazole.  Upper symptoms controlled.       Dale Bartlett, MD

## 2023-07-19 ENCOUNTER — Ambulatory Visit: Payer: Medicare Other | Admitting: *Deleted

## 2023-07-19 VITALS — Ht 69.0 in | Wt 225.0 lb

## 2023-07-19 DIAGNOSIS — Z0000 Encounter for general adult medical examination without abnormal findings: Secondary | ICD-10-CM

## 2023-07-19 NOTE — Patient Instructions (Signed)
 Joseph Good , Thank you for taking time to come for your Medicare Wellness Visit. I appreciate your ongoing commitment to your health goals. Please review the following plan we discussed and let me know if I can assist you in the future.   Referrals/Orders/Follow-Ups/Clinician Recommendations: Consider updating your shingles and covid vaccines.  This is a list of the screening recommended for you and due dates:  Health Maintenance  Topic Date Due   COVID-19 Vaccine (4 - 2024-25 season) 08/01/2023*   Zoster (Shingles) Vaccine (1 of 2) 10/16/2023*   Medicare Annual Wellness Visit  07/18/2024   DTaP/Tdap/Td vaccine (2 - Td or Tdap) 12/29/2025   Pneumonia Vaccine  Completed   Flu Shot  Completed   Hepatitis C Screening  Completed   HPV Vaccine  Aged Out   Colon Cancer Screening  Discontinued  *Topic was postponed. The date shown is not the original due date.    Advanced directives: (Declined) Advance directive discussed with you today. Even though you declined this today, please call our office should you change your mind, and we can give you the proper paperwork for you to fill out. Patient stated that he will work on this.  Next Medicare Annual Wellness Visit scheduled for next year: Yes 07/21/24 @ 10:50

## 2023-07-19 NOTE — Progress Notes (Signed)
 Subjective:   Joseph Good. is a 78 y.o. who presents for a Medicare Wellness preventive visit.  Visit Complete: Virtual I connected with  Joseph Good. on 07/19/23 by a audio enabled telemedicine application and verified that I am speaking with the correct person using two identifiers.  Patient Location: Home  Provider Location: Home Office  I discussed the limitations of evaluation and management by telemedicine. The patient expressed understanding and agreed to proceed.  Vital Signs: Because this visit was a virtual/telehealth visit, some criteria may be missing or patient reported. Any vitals not documented were not able to be obtained and vitals that have been documented are patient reported.  VideoDeclined- This patient declined Librarian, academic. Therefore the visit was completed with audio only.  Persons Participating in Visit: Patient.  AWV Questionnaire: Yes: Patient Medicare AWV questionnaire was completed by the patient on 07/18/23; I have confirmed that all information answered by patient is correct and no changes since this date.  Cardiac Risk Factors include: advanced age (>79men, >61 women);male gender;dyslipidemia;obesity (BMI >30kg/m2)     Objective:    Today's Vitals   07/19/23 0929  Weight: 225 lb (102.1 kg)  Height: 5\' 9"  (1.753 m)   Body mass index is 33.23 kg/m.     07/19/2023    9:45 AM 07/05/2022   12:33 PM 07/03/2021    9:09 AM 06/30/2020    9:18 AM 06/30/2019    9:15 AM 10/07/2017    4:31 PM 11/17/2016    4:58 PM  Advanced Directives  Does Patient Have a Medical Advance Directive? No Yes Yes No No No No  Type of Special educational needs teacher of [place];Living will Living will      Does patient want to make changes to medical advance directive?  No - Patient declined No - Patient declined No - Patient declined     Copy of Healthcare Power of Attorney in Chart?  No - copy requested       Would patient like  information on creating a medical advance directive? No - Patient declined    No - Patient declined Yes (MAU/Ambulatory/Procedural Areas - Information given) No - Patient declined    Current Medications (verified) Outpatient Encounter Medications as of 07/19/2023  Medication Sig   albuterol (VENTOLIN HFA) 108 (90 Base) MCG/ACT inhaler Inhale 2 puffs into the lungs every 6 (six) hours as needed for wheezing or shortness of breath.   CALCIUM CITRATE PO Take 1 tablet by mouth daily.   Cholecalciferol (VITAMIN D-3) 1000 UNITS CAPS Take 1 capsule by mouth daily.   Coenzyme Q10 (COQ10) 100 MG CAPS Take 100 mg by mouth daily.   finasteride (PROSCAR) 5 MG tablet Take 1 tablet (5 mg total) by mouth daily.   fluticasone furoate-vilanterol (BREO ELLIPTA) 100-25 MCG/ACT AEPB Inhale 1 puff into the lungs daily.   Magnesium 250 MG TABS Two tablets q day   Multiple Vitamins-Minerals (CENTRUM SILVER 50+MEN PO) Take 1 tablet by mouth daily.   omeprazole (PRILOSEC) 20 MG capsule Take 1 capsule (20 mg total) by mouth 2 (two) times daily before a meal.   pramipexole (MIRAPEX) 0.5 MG tablet TAKE 1 TABLET BY MOUTH EVERYDAY AT BEDTIME   pravastatin (PRAVACHOL) 40 MG tablet Take 1 tablet (40 mg total) by mouth daily.   No facility-administered encounter medications on file as of 07/19/2023.    Allergies (verified) Seldane [terfenadine]   History: Past Medical History:  Diagnosis Date  Asthma    Bladder outlet obstruction    Crohn's disease (HCC)    appendix, s/p appendectomy   GERD (gastroesophageal reflux disease)    schatzki ring   Hypercholesterolemia    Restless leg syndrome    Seizure disorder [facility])    age 15 & 53, previously on phenobarbital   Skin cancer, basal cell    Sleep apnea    CPAP   Past Surgical History:  Procedure Laterality Date   APPENDECTOMY  1992   lysis of adhesions (bowel blockage)   inguinal hernia surgery  1999   left   KNEE ARTHROSCOPY  1982   NASAL POLYP SURGERY   1963   TONSILLECTOMY AND ADENOIDECTOMY  1956   Family History  Problem Relation Age of Onset   Heart disease Father    Congestive Heart Failure Mother    Colon cancer Neg Hx    Prostate cancer Neg Hx    Social History   Socioeconomic History   Marital status: Married    Spouse name: Not on file   Number of children: 0   Years of education: Not on file   Highest education level: Associate degree: academic program  Occupational History   Not on file  Tobacco Use   Smoking status: Never   Smokeless tobacco: Never  Vaping Use   Vaping status: Never Used  Substance and Sexual Activity   Alcohol use: No    Alcohol/week: 0.0 standard drinks of alcohol   Drug use: No   Sexual activity: Not Currently  Other Topics Concern   Not on file  Social History Narrative   Married   Social Drivers of Health   Financial Resource Strain: Low Risk  (07/19/2023)   Overall Financial Resource Strain (CARDIA)    Difficulty of Paying Living Expenses: Not hard at all  Food Insecurity: No Food Insecurity (07/19/2023)   Hunger Vital Sign    Worried About Running Out of Food in the Last Year: Never true    Ran Out of Food in the Last Year: Never true  Transportation Needs: No Transportation Needs (07/19/2023)   PRAPARE - Administrator, Civil Service (Medical): No    Lack of Transportation (Non-Medical): No  Physical Activity: Inactive (07/19/2023)   Exercise Vital Sign    Days of Exercise per Week: 1 day    Minutes of Exercise per Session: 0 min  Stress: No Stress Concern Present (07/19/2023)   [employer] of Occupational Health - Occupational Stress Questionnaire    Feeling of Stress : Not at all  Social Connections: Socially Integrated (07/19/2023)   Social Connection and Isolation Panel [NHANES]    Frequency of Communication with Friends and Family: More than three times a week    Frequency of Social Gatherings with Friends and Family: Twice a week    Attends Religious  Services: More than 4 times per year    Active Member of [employer] or Organizations: Yes    Attends Engineer, structural: More than 4 times per year    Marital Status: Married    Tobacco Counseling Counseling given: Not Answered    Clinical Intake:  Pre-visit preparation completed: Yes  Pain : No/denies pain     BMI - recorded: 33.23 Nutritional Status: BMI > 30  Obese Nutritional Risks: None Diabetes: No  No results found for: "HGBA1C"   How often do you need to have someone help you when you read instructions, pamphlets, or other written materials from your doctor  or pharmacy?: 1 - Never  Interpreter Needed?: No  Information entered by :: R. Lory Nowaczyk LPN   Activities of Daily Living     07/18/2023   10:35 AM  In your present state of health, do you have any difficulty performing the following activities:  Hearing? 1  Comment wears aids  Vision? 0  Comment readers  Difficulty concentrating or making decisions? 0  Walking or climbing stairs? 0  Dressing or bathing? 0  Doing errands, shopping? 0  Preparing Food and eating ? N  Using the Toilet? N  In the past six months, have you accidently leaked urine? N  Do you have problems with loss of bowel control? N  Managing your Medications? N  Managing your Finances? N  Housekeeping or managing your Housekeeping? N    Patient Care Team: Dale Salem, MD as PCP - General (Internal Medicine)  Indicate any recent Medical Services you may have received from other than [facility] providers in the past year (date may be approximate).     Assessment:   This is a routine wellness examination for Kayde.  Hearing/Vision screen Hearing Screening - Comments:: Wears aids Vision Screening - Comments:: readers   Goals Addressed             This Visit's Progress    Patient Stated       Wants to try to exercise at least 5 days a week       Depression Screen     07/19/2023    9:38 AM 01/11/2023    7:16 AM  07/05/2022   12:36 PM 03/15/2022    8:41 AM 09/11/2021    8:43 AM 07/03/2021    9:07 AM 06/30/2020    9:29 AM  PHQ 2/9 Scores  PHQ - 2 Score 0 0 0 0 0 0 0  PHQ- 9 Score 0 0         Fall Risk     07/18/2023   10:35 AM 01/11/2023    7:16 AM 07/01/2022    9:09 AM 03/15/2022    8:41 AM 09/11/2021    8:43 AM  Fall Risk   Falls in the past year? 0 0 0 0 0  Number falls in past yr: 0 0  0   Injury with Fall? 0 0  0   Risk for fall due to : No Fall Risks No Fall Risks  No Fall Risks No Fall Risks  Follow up Falls prevention discussed;Falls evaluation completed Falls evaluation completed Falls evaluation completed;Falls prevention discussed Falls evaluation completed Falls evaluation completed    MEDICARE RISK AT HOME:  Medicare Risk at Home Any stairs in or around the home?: (Patient-Rptd) Yes If so, are there any without handrails?: (Patient-Rptd) No Home free of loose throw rugs in walkways, pet beds, electrical cords, etc?: (Patient-Rptd) Yes Adequate lighting in your home to reduce risk of falls?: (Patient-Rptd) Yes Life alert?: (Patient-Rptd) No Use of a cane, walker or w/c?: (Patient-Rptd) No Grab bars in the bathroom?: (Patient-Rptd) Yes Shower chair or bench in shower?: (Patient-Rptd) No Elevated toilet seat or a handicapped toilet?: (Patient-Rptd) Yes  TIMED UP AND GO:  Was the test performed?  No  Cognitive Function: 6CIT completed    10/07/2017    5:05 PM 09/13/2016    1:49 PM 09/14/2015    4:04 PM  MMSE - Mini Mental State Exam  Orientation to time 5 5 5   Orientation to Place 5 5 5   Registration 3 3 3   Attention/  Calculation 5 5 5   Recall 3 3 3   Language- name 2 objects 2 2 2   Language- repeat 1 1 1   Language- follow 3 step command 3 3 3   Language- read & follow direction 1 1 1   Write a sentence 1 1 1   Copy design 1 1 1   Total score 30 30 30         07/19/2023    9:48 AM 07/05/2022   12:44 PM 06/30/2019    9:36 AM  6CIT Screen  What Year? 0 points 0 points 0 points   What month? 0 points 0 points 0 points  What time? 0 points 0 points 0 points  Count back from 20 0 points 0 points 0 points  Months in reverse 0 points 0 points 0 points  Repeat phrase 0 points 0 points   Total Score 0 points 0 points     Immunizations Immunization History  Administered Date(s) Administered   Fluad Quad(high Dose 65+) 02/03/2019, 03/30/2019, 03/03/2021, 01/18/2022   Fluad Trivalent(High Dose 65+) 01/11/2023   Influenza Split 03/02/2013   Influenza, High Dose Seasonal PF 02/03/2016, 02/15/2017, 02/25/2018   Influenza,inj,Quad PF,6+ Mos 01/22/2014, 01/31/2015   Influenza-Unspecified 02/25/2018   Janssen (J&J) SARS-COV-2 Vaccination 07/12/2019, 04/08/2020   Moderna Covid-19 Vaccine Bivalent Booster 70yrs & up 03/14/2021   Pneumococcal Conjugate-13 07/22/2013   Pneumococcal Polysaccharide-23 02/11/2015   Tdap 12/30/2015    Screening Tests Health Maintenance  Topic Date Due   COVID-19 Vaccine (4 - 2024-25 season) 08/01/2023 (Originally 12/30/2022)   Zoster Vaccines- Shingrix (1 of 2) 10/16/2023 (Originally 01/16/1997)   Medicare Annual Wellness (AWV)  07/18/2024   DTaP/Tdap/Td (2 - Td or Tdap) 12/29/2025   Pneumonia Vaccine 11+ Years old  Completed   INFLUENZA VACCINE  Completed   Hepatitis C Screening  Completed   HPV VACCINES  Aged Out   Colonoscopy  Discontinued    Health Maintenance  There are no preventive care reminders to display for this patient.  Health Maintenance Items Addressed: Discussed the need to update shingles and covid vaccines.  Additional Screening:  Vision Screening: Recommended annual ophthalmology exams for early detection of glaucoma and other disorders of the eye.  Patient needs an eye exam and will call and schedule an appointment at [facility], ENT in [place] where his wife has been.  Dental Screening: Recommended annual dental exams for proper oral hygiene  Community Resource Referral / Chronic Care Management: CRR required this  visit?  No   CCM required this visit?  No     Plan:     I have personally reviewed and noted the following in the patient's chart:   Medical and social history Use of alcohol, tobacco or illicit drugs  Current medications and supplements including opioid prescriptions. Patient is not currently taking opioid prescriptions. Functional ability and status Nutritional status Physical activity Advanced directives List of other physicians Hospitalizations, surgeries, and ER visits in previous 12 months Vitals Screenings to include cognitive, depression, and falls Referrals and appointments  In addition, I have reviewed and discussed with patient certain preventive protocols, quality metrics, and best practice recommendations. A written personalized care plan for preventive services as well as general preventive health recommendations were provided to patient.     Sydell Axon, LPN   1/61/0960   After Visit Summary: (MyChart) Due to this being a telephonic visit, the after visit summary with patients personalized plan was offered to patient via MyChart   Notes: Nothing significant to report at this time.

## 2023-07-21 ENCOUNTER — Encounter: Payer: Self-pay | Admitting: Internal Medicine

## 2023-07-21 ENCOUNTER — Telehealth: Payer: Self-pay | Admitting: Internal Medicine

## 2023-07-21 DIAGNOSIS — G4730 Sleep apnea, unspecified: Secondary | ICD-10-CM

## 2023-07-21 NOTE — Assessment & Plan Note (Signed)
 Continue weight bearing exercise.  Check vitamin d level.  Agreeable to f/u bone density.

## 2023-07-21 NOTE — Assessment & Plan Note (Signed)
Doing well on mirapex.  Follow.

## 2023-07-21 NOTE — Assessment & Plan Note (Signed)
 Physical today 07/16/23.  Follow psa. (12/2022 - 1.39).  Colonoscopy - need results of last. (Was scheduled for 07/2022).

## 2023-07-21 NOTE — Assessment & Plan Note (Signed)
 Uses cpap regularly. Benefits from use. Has questions about his cpap. His switch on his machine has broken. He still uses the machine, but plugs in. Was questioning getting a new machine. Uses his cpap regularly. Feels better when sleeps with the cpap.

## 2023-07-21 NOTE — Telephone Encounter (Signed)
 Joseph Good has sleep apnea. Uses adapt -for cpap. He uses his machine regularly. His switch is broke. He plugs in his machine, but given that his switch is broken, he would like a new machine. Also, need copy of his last colonoscopy (question 2024).

## 2023-07-21 NOTE — Assessment & Plan Note (Signed)
 Continue statin medication.  Low cholesterol diet and exercise.  Follow lipid panel and liver function tests.

## 2023-07-21 NOTE — Assessment & Plan Note (Signed)
Continue omeprazole.  Upper symptoms controlled.

## 2023-07-25 NOTE — Telephone Encounter (Signed)
 Ok for order for cpap.

## 2023-07-25 NOTE — Telephone Encounter (Signed)
 Copied from CRM [id number]. Topic: General - Other >> Jul 25, 2023  9:39 AM Truddie Crumble wrote: Reason for CRM: patient would like the nurse to call him back regarding his cpap machine

## 2023-07-25 NOTE — Telephone Encounter (Signed)
 Spoke to pt. Pt stated that he needs a new Cpap machine order called into [facility]. He received this information from the company on the back of his machine.

## 2023-07-25 NOTE — Telephone Encounter (Signed)
 Spoke with patient and advised to call the number on his machine to initiate getting a new one since his is broken. Also asked patient about his last colonoscopy. Says it was not in 2024 but Dr Barbie Banner in [place] did his last one. Will request documents once  I return to office.

## 2023-07-26 NOTE — Telephone Encounter (Signed)
DME printed for signature 

## 2023-07-26 NOTE — Addendum Note (Signed)
 Addended by: Rita Ohara D on: 07/26/2023 11:45 AM   Modules accepted: Orders

## 2023-08-20 ENCOUNTER — Ambulatory Visit
Admission: RE | Admit: 2023-08-20 | Discharge: 2023-08-20 | Disposition: A | Discharge status: Home / Self Care | Source: Ambulatory Visit | Attending: Internal Medicine | Admitting: Internal Medicine

## 2023-08-20 DIAGNOSIS — M810 Age-related osteoporosis without current pathological fracture: Secondary | ICD-10-CM | POA: Insufficient documentation

## 2023-08-20 DIAGNOSIS — Z13820 Encounter for screening for osteoporosis: Secondary | ICD-10-CM | POA: Diagnosis not present

## 2023-09-03 ENCOUNTER — Encounter: Payer: Self-pay | Admitting: Internal Medicine

## 2023-09-03 ENCOUNTER — Ambulatory Visit (INDEPENDENT_AMBULATORY_CARE_PROVIDER_SITE_OTHER): Admitting: Internal Medicine

## 2023-09-03 VITALS — BP 120/70 | HR 69 | Temp 98.0°F | Resp 16 | Ht 69.0 in | Wt 229.0 lb

## 2023-09-03 DIAGNOSIS — G4730 Sleep apnea, unspecified: Secondary | ICD-10-CM | POA: Diagnosis not present

## 2023-09-03 DIAGNOSIS — M810 Age-related osteoporosis without current pathological fracture: Secondary | ICD-10-CM

## 2023-09-03 DIAGNOSIS — J4520 Mild intermittent asthma, uncomplicated: Secondary | ICD-10-CM | POA: Diagnosis not present

## 2023-09-03 NOTE — Assessment & Plan Note (Signed)
 Discussed results of bone density. Discussed treatment options as outlined. Desires to start reclast. Refer to endocrinology. Continue calcium, vitamin  D and weight bearing exercise. Follow.

## 2023-09-03 NOTE — Progress Notes (Signed)
 Subjective:    Patient ID: Joseph Good., male    DOB: 13-May-1945, 78 y.o.   MRN: [id number]  Patient here for  Chief Complaint  Patient presents with   Osteoporosis    HPI Here to discuss treatment for osteoporosis. Had bone density 08/20/23 - T score -2.6 in lumbar spine. Discussed bone density results. Discussed treatment options, including oral bisphosphonates, reclast, prolia. Discussed possible side effects of these medications. Previous history  - GI issues - history of hiatal hernia. Doing well now.    Past Medical History:  Diagnosis Date   Asthma    Bladder outlet obstruction    Crohn's disease (HCC)    appendix, s/p appendectomy   GERD (gastroesophageal reflux disease)    schatzki ring   Hypercholesterolemia    Restless leg syndrome    Seizure disorder [facility])    age 36 & 68, previously on phenobarbital   Skin cancer, basal cell    Sleep apnea    CPAP   Past Surgical History:  Procedure Laterality Date   APPENDECTOMY  1992   lysis of adhesions (bowel blockage)   inguinal hernia surgery  1999   left   KNEE ARTHROSCOPY  1982   NASAL POLYP SURGERY  1963   TONSILLECTOMY AND ADENOIDECTOMY  1956   Family History  Problem Relation Age of Onset   Heart disease Father    Congestive Heart Failure Mother    Colon cancer Neg Hx    Prostate cancer Neg Hx    Social History   Socioeconomic History   Marital status: Married    Spouse name: Not on file   Number of children: 0   Years of education: Not on file   Highest education level: Associate degree: academic program  Occupational History   Not on file  Tobacco Use   Smoking status: Never   Smokeless tobacco: Never  Vaping Use   Vaping status: Never Used  Substance and Sexual Activity   Alcohol use: No    Alcohol/week: 0.0 standard drinks of alcohol   Drug use: No   Sexual activity: Not Currently  Other Topics Concern   Not on file  Social History Narrative   Married   Social Drivers of Health    Financial Resource Strain: Low Risk  (07/19/2023)   Overall Financial Resource Strain (CARDIA)    Difficulty of Paying Living Expenses: Not hard at all  Food Insecurity: No Food Insecurity (07/19/2023)   Hunger Vital Sign    Worried About Running Out of Food in the Last Year: Never true    Ran Out of Food in the Last Year: Never true  Transportation Needs: No Transportation Needs (07/19/2023)   PRAPARE - Administrator, Civil Service (Medical): No    Lack of Transportation (Non-Medical): No  Physical Activity: Inactive (07/19/2023)   Exercise Vital Sign    Days of Exercise per Week: 1 day    Minutes of Exercise per Session: 0 min  Stress: No Stress Concern Present (07/19/2023)   [employer] of Occupational Health - Occupational Stress Questionnaire    Feeling of Stress : Not at all  Social Connections: Socially Integrated (07/19/2023)   Social Connection and Isolation Panel [NHANES]    Frequency of Communication with Friends and Family: More than three times a week    Frequency of Social Gatherings with Friends and Family: Twice a week    Attends Religious Services: More than 4 times per year  Active Member of Clubs or Organizations: Yes    Attends Banker Meetings: More than 4 times per year    Marital Status: Married     Review of Systems  Constitutional:  Negative for appetite change and unexpected weight change.  HENT:  Negative for congestion and sinus pressure.   Respiratory:  Negative for cough, chest tightness and shortness of breath.   Cardiovascular:  Negative for chest pain, palpitations and leg swelling.  Gastrointestinal:  Negative for abdominal pain, diarrhea, nausea and vomiting.  Genitourinary:  Negative for difficulty urinating and dysuria.  Musculoskeletal:  Negative for joint swelling and myalgias.  Skin:  Negative for color change and rash.  Neurological:  Negative for dizziness and headaches.  Psychiatric/Behavioral:  Negative  for agitation and dysphoric mood.        Objective:     BP 120/70   Pulse 69   Temp 98 F (36.7 C)   Resp 16   Ht 5\' 9"  (1.753 m)   Wt 229 lb (103.9 kg)   SpO2 98%   BMI 33.82 kg/m  Wt Readings from Last 3 Encounters:  09/03/23 229 lb (103.9 kg)  07/19/23 225 lb (102.1 kg)  07/16/23 228 lb (103.4 kg)    Physical Exam Vitals reviewed.  Constitutional:      General: He is not in acute distress.    Appearance: Normal appearance. He is well-developed.  HENT:     Head: Normocephalic and atraumatic.     Right Ear: External ear normal.     Left Ear: External ear normal.  Eyes:     General: No scleral icterus.       Right eye: No discharge.        Left eye: No discharge.     Conjunctiva/sclera: Conjunctivae normal.  Cardiovascular:     Rate and Rhythm: Normal rate and regular rhythm.  Pulmonary:     Effort: Pulmonary effort is normal. No respiratory distress.     Breath sounds: Normal breath sounds.  Abdominal:     General: Bowel sounds are normal.     Palpations: Abdomen is soft.     Tenderness: There is no abdominal tenderness.  Musculoskeletal:        General: No swelling or tenderness.     Cervical back: Neck supple. No tenderness.  Lymphadenopathy:     Cervical: No cervical adenopathy.  Skin:    Findings: No erythema or rash.  Neurological:     Mental Status: He is alert.  Psychiatric:        Mood and Affect: Mood normal.        Behavior: Behavior normal.         Outpatient Encounter Medications as of 09/03/2023  Medication Sig   albuterol  (VENTOLIN  HFA) 108 (90 Base) MCG/ACT inhaler Inhale 2 puffs into the lungs every 6 (six) hours as needed for wheezing or shortness of breath.   CALCIUM CITRATE PO Take 1 tablet by mouth daily.   Cholecalciferol (VITAMIN D -3) 1000 UNITS CAPS Take 1 capsule by mouth daily.   Coenzyme Q10 (COQ10) 100 MG CAPS Take 100 mg by mouth daily.   finasteride  (PROSCAR ) 5 MG tablet Take 1 tablet (5 mg total) by mouth daily.    fluticasone  furoate-vilanterol (BREO ELLIPTA ) 100-25 MCG/ACT AEPB Inhale 1 puff into the lungs daily.   Magnesium 250 MG TABS Two tablets q day   Multiple Vitamins-Minerals (CENTRUM SILVER 50+MEN PO) Take 1 tablet by mouth daily.   omeprazole  (PRILOSEC) 20 MG  capsule Take 1 capsule (20 mg total) by mouth 2 (two) times daily before a meal.   pramipexole  (MIRAPEX ) 0.5 MG tablet TAKE 1 TABLET BY MOUTH EVERYDAY AT BEDTIME   pravastatin  (PRAVACHOL ) 40 MG tablet Take 1 tablet (40 mg total) by mouth daily.   No facility-administered encounter medications on file as of 09/03/2023.     Lab Results  Component Value Date   WBC 5.0 01/11/2023   HGB 15.0 01/11/2023   HCT 45.1 01/11/2023   PLT 217.0 01/11/2023   GLUCOSE 87 07/16/2023   CHOL 165 07/16/2023   TRIG 99.0 07/16/2023   HDL 47.50 07/16/2023   LDLDIRECT 154.7 05/22/2013   LDLCALC 98 07/16/2023   ALT 14 07/16/2023   AST 45 (H) 07/16/2023   NA 139 07/16/2023   K 4.0 07/16/2023   CL 100 07/16/2023   CREATININE 1.08 07/16/2023   BUN 18 07/16/2023   CO2 31 07/16/2023   TSH 3.26 01/11/2023   PSA 1.39 01/11/2023   INR 1.00 11/17/2016    DG Bone Density Result Date: 08/20/2023 EXAM: DUAL X-RAY ABSORPTIOMETRY (DXA) FOR BONE MINERAL DENSITY 08/20/2023 9:26 am CLINICAL DATA:  78 year old Male Screening for osteoporosis TECHNIQUE: An axial (e.g., hips, spine) and/or appendicular (e.g., radius) exam was performed, as appropriate, using GE Secretary/administrator at [facility]. Images are obtained for bone mineral density measurement and are not obtained for diagnostic purposes. [id number] Exclusions: L3-L4 due to degenerative changes. COMPARISON:  None. FINDINGS: Scan quality: Good. LUMBAR SPINE (L1-L2): BMD (in g/cm2): 0.890 T-score: -2.6 Z-score: -2.0 LEFT FEMORAL NECK: BMD (in g/cm2): 0.944 T-score: -1.0 Z-score: 0.5 LEFT TOTAL HIP: BMD (in g/cm2): 1.014 T-score: -0.6 Z-score: 0.4 RIGHT FEMORAL NECK: BMD (in g/cm2): 0.850  T-score: -1.7 Z-score: -0.3 RIGHT TOTAL HIP: BMD (in g/cm2): 0.915 T-score: -1.3 Z-score: -0.3 LEFT FOREARM (RADIUS 33%): BMD (in g/cm2): 1.012 T-score: 0.2 Z-score: 1.3 FRAX 10-YEAR PROBABILITY OF FRACTURE: FRAX not reported as the lowest BMD is not in the osteopenia range. IMPRESSION: Osteoporosis based on BMD. Fracture risk is increased. Increased risk is based on low BMD. RECOMMENDATIONS: 1. All patients should optimize calcium and vitamin D  intake. 2. Consider FDA-approved medical therapies in postmenopausal women and men aged 59 years and older, based on the following: - A hip or vertebral (clinical or morphometric) fracture - T-score less than or equal to -2.5 and secondary causes have been excluded. - Low bone mass (T-score between -1.0 and -2.5) and a 10-year probability of a hip fracture greater than or equal to 3% or a 10-year probability of a major osteoporosis-related fracture greater than or equal to 20% based on the [place] -adapted WHO algorithm. - Clinician judgment and/or patient preferences may indicate treatment for people with 10-year fracture probabilities above or below these levels 3. Patients with diagnosis of osteoporosis or at high risk for fracture should have regular bone mineral density tests. For patients eligible for Medicare, routine testing is allowed once every 2 years. The testing frequency can be increased to one year for patients who have rapidly progressing disease, those who are receiving or discontinuing medical therapy to restore bone mass, or have additional risk factors. Electronically Signed   By: Sundra Engel M.D.   On: 08/20/2023 11:45       Assessment & Plan:  Osteoporosis, unspecified osteoporosis type, unspecified pathological fracture presence Assessment & Plan: Discussed results of bone density. Discussed treatment options as outlined. Desires to start reclast. Refer to endocrinology. Continue calcium, vitamin  D  and weight bearing exercise. Follow.   Orders: -      Ambulatory referral to Endocrinology  Mild intermittent asthma without complication Assessment & Plan: Breathing stable. Continue breo.       Dellar Fenton, MD

## 2023-09-03 NOTE — Assessment & Plan Note (Signed)
Breathing stable.  Continue breo.  

## 2023-09-04 NOTE — Addendum Note (Signed)
 Addended by: Victorino Grates D on: 09/04/2023 07:50 AM   Modules accepted: Orders

## 2023-09-19 ENCOUNTER — Encounter: Payer: Self-pay | Admitting: Internal Medicine

## 2023-09-19 DIAGNOSIS — G4730 Sleep apnea, unspecified: Secondary | ICD-10-CM

## 2023-09-30 DIAGNOSIS — Z961 Presence of intraocular lens: Secondary | ICD-10-CM | POA: Diagnosis not present

## 2023-09-30 DIAGNOSIS — H0012 Chalazion right lower eyelid: Secondary | ICD-10-CM | POA: Diagnosis not present

## 2023-09-30 DIAGNOSIS — H527 Unspecified disorder of refraction: Secondary | ICD-10-CM | POA: Diagnosis not present

## 2023-09-30 DIAGNOSIS — H43813 Vitreous degeneration, bilateral: Secondary | ICD-10-CM | POA: Diagnosis not present

## 2023-10-03 ENCOUNTER — Other Ambulatory Visit: Payer: Self-pay | Admitting: Internal Medicine

## 2023-10-09 ENCOUNTER — Telehealth: Payer: Self-pay | Admitting: Internal Medicine

## 2023-10-09 NOTE — Telephone Encounter (Signed)
 Copied from CRM [id number]. Topic: General - Other >> Oct 09, 2023 10:54 AM Magdalene School wrote: Reason for CRM: Angelica calling from from [facility] requesting clinical notes stating that patient is benefiting from [employer] G3 Auto-CPAP Machine and prescription with medicare valid supply order with machine pressure settings.  Also requesting sleep study to be signed by boared certified sleep doctor and sent to fax number below.  Fax: [phone]

## 2023-10-11 ENCOUNTER — Other Ambulatory Visit: Payer: Self-pay

## 2023-10-11 DIAGNOSIS — G4730 Sleep apnea, unspecified: Secondary | ICD-10-CM

## 2023-10-11 NOTE — Telephone Encounter (Signed)
 I am his internal medicine MD.  I see him and f/u on his sleep apnea. Order signed.

## 2023-10-11 NOTE — Telephone Encounter (Signed)
 Order, sleep study and note faxed.

## 2023-10-11 NOTE — Progress Notes (Signed)
 NEW CPAP ORDER PRINTED AGAIN

## 2023-10-11 NOTE — Telephone Encounter (Signed)
 Sleep study, clinic note and new CPAP order (3rd order since March) has been printed for signature.

## 2023-10-15 ENCOUNTER — Telehealth: Payer: Self-pay

## 2023-10-15 DIAGNOSIS — L570 Actinic keratosis: Secondary | ICD-10-CM | POA: Diagnosis not present

## 2023-10-15 DIAGNOSIS — L2089 Other atopic dermatitis: Secondary | ICD-10-CM | POA: Diagnosis not present

## 2023-10-15 NOTE — Telephone Encounter (Signed)
Faxed to [facility]

## 2023-10-15 NOTE — Telephone Encounter (Signed)
 Signed and placed in box.

## 2023-10-15 NOTE — Telephone Encounter (Signed)
 Copied from CRM [id number]. Topic: General - Other >> Oct 15, 2023  1:12 PM Jenice Mitts wrote: Reason for CRM: Kim from [facility] calling because the prescription did not include the humidifier. They have faxed over paperwork that needs to be filled out and sent back for the  humidifer fax#[phone].

## 2023-10-15 NOTE — Telephone Encounter (Signed)
 Order printed for signature.

## 2023-10-25 NOTE — Telephone Encounter (Unsigned)
 Copied from CRM [id number]. Topic: General - Other >> Oct 25, 2023  2:34 PM Deaijah H wrote: Reason for CRM: Jared w/ [facility] called in to follow up on CPAP Machine order advised it was signed & faxed 10/15/23 stated they need another document due to previous one with missing code & Second document needs date w/ signature. Any notes or changes will need to to documented and dated Fax number : [phone] Call back: [phone]

## 2023-10-30 ENCOUNTER — Other Ambulatory Visit: Payer: Self-pay

## 2023-10-30 NOTE — Telephone Encounter (Signed)
 We have sent 4 orders for CPAP since March. Called [facility] and asked that they send the correct order to have Dr Glendia so patient can get his machine.

## 2023-10-31 NOTE — Telephone Encounter (Signed)
Signed.  Placed on your desk.  

## 2023-10-31 NOTE — Telephone Encounter (Signed)
 Correct order placed out for signature

## 2023-10-31 NOTE — Telephone Encounter (Signed)
 Faxed back to [facility].

## 2023-11-07 DIAGNOSIS — Z1331 Encounter for screening for depression: Secondary | ICD-10-CM | POA: Diagnosis not present

## 2023-11-07 DIAGNOSIS — M810 Age-related osteoporosis without current pathological fracture: Secondary | ICD-10-CM | POA: Diagnosis not present

## 2024-01-16 ENCOUNTER — Ambulatory Visit: Admitting: Internal Medicine

## 2024-01-16 VITALS — BP 130/70 | HR 63 | Resp 16 | Ht 69.0 in | Wt 228.0 lb

## 2024-01-16 DIAGNOSIS — Z23 Encounter for immunization: Secondary | ICD-10-CM | POA: Diagnosis not present

## 2024-01-16 DIAGNOSIS — G4730 Sleep apnea, unspecified: Secondary | ICD-10-CM | POA: Diagnosis not present

## 2024-01-16 DIAGNOSIS — J4520 Mild intermittent asthma, uncomplicated: Secondary | ICD-10-CM

## 2024-01-16 DIAGNOSIS — E7800 Pure hypercholesterolemia, unspecified: Secondary | ICD-10-CM

## 2024-01-16 DIAGNOSIS — K219 Gastro-esophageal reflux disease without esophagitis: Secondary | ICD-10-CM | POA: Diagnosis not present

## 2024-01-16 DIAGNOSIS — M810 Age-related osteoporosis without current pathological fracture: Secondary | ICD-10-CM | POA: Diagnosis not present

## 2024-01-16 DIAGNOSIS — Z125 Encounter for screening for malignant neoplasm of prostate: Secondary | ICD-10-CM

## 2024-01-16 NOTE — Progress Notes (Signed)
 Subjective:    Patient ID: Joseph Good., male    DOB: 11/28/1945, 78 y.o.   MRN: [id number]  Patient here for  Chief Complaint  Patient presents with   CPAP Compliance   Medical Management of Chronic Issues    HPI Here for a scheduled follow up - has known sleep apnea. Uses cpap regularly and benefits from use. Using cpap every night - at least 6 hours per night. Tolerating. Saw Dr Solum 11/07/23 - f/u osteoporosis. Trial of fosamax. No chest pain or sob reported. Breathing stable. Uses albuterol  1x/week. No abdominal pain reported.    Past Medical History:  Diagnosis Date   Asthma    Bladder outlet obstruction    Crohn's disease (HCC)    appendix, s/p appendectomy   GERD (gastroesophageal reflux disease)    schatzki ring   Hypercholesterolemia    Restless leg syndrome    Seizure disorder [facility])    age 38 & 5, previously on phenobarbital   Skin cancer, basal cell    Sleep apnea    CPAP   Past Surgical History:  Procedure Laterality Date   APPENDECTOMY  1992   lysis of adhesions (bowel blockage)   inguinal hernia surgery  1999   left   KNEE ARTHROSCOPY  1982   NASAL POLYP SURGERY  1963   TONSILLECTOMY AND ADENOIDECTOMY  1956   Family History  Problem Relation Age of Onset   Heart disease Father    Congestive Heart Failure Mother    Colon cancer Neg Hx    Prostate cancer Neg Hx    Social History   Socioeconomic History   Marital status: Married    Spouse name: Not on file   Number of children: 0   Years of education: Not on file   Highest education level: Associate degree: academic program  Occupational History   Not on file  Tobacco Use   Smoking status: Never   Smokeless tobacco: Never  Vaping Use   Vaping status: Never Used  Substance and Sexual Activity   Alcohol use: No    Alcohol/week: 0.0 standard drinks of alcohol   Drug use: No   Sexual activity: Not Currently  Other Topics Concern   Not on file  Social History Narrative   Married    Social Drivers of Health   Financial Resource Strain: Low Risk  (01/12/2024)   Overall Financial Resource Strain (CARDIA)    Difficulty of Paying Living Expenses: Not hard at all  Food Insecurity: No Food Insecurity (01/12/2024)   Hunger Vital Sign    Worried About Running Out of Food in the Last Year: Never true    Ran Out of Food in the Last Year: Never true  Transportation Needs: No Transportation Needs (01/12/2024)   PRAPARE - Administrator, Civil Service (Medical): No    Lack of Transportation (Non-Medical): No  Physical Activity: Insufficiently Active (01/12/2024)   Exercise Vital Sign    Days of Exercise per Week: 2 days    Minutes of Exercise per Session: 30 min  Stress: No Stress Concern Present (01/12/2024)   [employer] of Occupational Health - Occupational Stress Questionnaire    Feeling of Stress: Only a little  Social Connections: Socially Integrated (01/12/2024)   Social Connection and Isolation Panel    Frequency of Communication with Friends and Family: Twice a week    Frequency of Social Gatherings with Friends and Family: Twice a week    Attends Religious Services:  More than 4 times per year    Active Member of Clubs or Organizations: Yes    Attends Banker Meetings: More than 4 times per year    Marital Status: Married     Review of Systems  Constitutional:  Negative for appetite change and unexpected weight change.  HENT:  Negative for congestion and sinus pressure.   Respiratory:  Negative for cough, chest tightness and shortness of breath.   Cardiovascular:  Negative for chest pain, palpitations and leg swelling.  Gastrointestinal:  Negative for abdominal pain, diarrhea, nausea and vomiting.  Genitourinary:  Negative for difficulty urinating and dysuria.  Musculoskeletal:  Negative for joint swelling and myalgias.  Skin:  Negative for color change and rash.  Neurological:  Negative for dizziness and headaches.   Psychiatric/Behavioral:  Negative for agitation and dysphoric mood.        Objective:     BP 130/70   Pulse 63   Resp 16   Ht 5' 9 (1.753 m)   Wt 228 lb (103.4 kg)   SpO2 98%   BMI 33.67 kg/m  Wt Readings from Last 3 Encounters:  01/16/24 228 lb (103.4 kg)  09/03/23 229 lb (103.9 kg)  07/19/23 225 lb (102.1 kg)    Physical Exam Vitals reviewed.  Constitutional:      General: He is not in acute distress.    Appearance: Normal appearance. He is well-developed.  HENT:     Head: Normocephalic and atraumatic.     Right Ear: External ear normal.     Left Ear: External ear normal.     Mouth/Throat:     Pharynx: No oropharyngeal exudate or posterior oropharyngeal erythema.  Eyes:     General: No scleral icterus.       Right eye: No discharge.        Left eye: No discharge.     Conjunctiva/sclera: Conjunctivae normal.  Cardiovascular:     Rate and Rhythm: Normal rate and regular rhythm.  Pulmonary:     Effort: Pulmonary effort is normal. No respiratory distress.     Breath sounds: Normal breath sounds.  Abdominal:     General: Bowel sounds are normal.     Palpations: Abdomen is soft.     Tenderness: There is no abdominal tenderness.  Musculoskeletal:        General: No swelling or tenderness.     Cervical back: Neck supple. No tenderness.  Lymphadenopathy:     Cervical: No cervical adenopathy.  Skin:    Findings: No erythema or rash.  Neurological:     Mental Status: He is alert.  Psychiatric:        Mood and Affect: Mood normal.        Behavior: Behavior normal.         Outpatient Encounter Medications as of 01/16/2024  Medication Sig   alendronate (FOSAMAX) 70 MG tablet Take 70 mg by mouth once a week. Take with a full glass of water on an empty stomach.   CALCIUM CITRATE PO Take 1 tablet by mouth daily.   Cholecalciferol (VITAMIN D -3) 1000 UNITS CAPS Take 1 capsule by mouth daily.   Coenzyme Q10 (COQ10) 100 MG CAPS Take 100 mg by mouth daily.    finasteride  (PROSCAR ) 5 MG tablet Take 1 tablet (5 mg total) by mouth daily.   fluticasone  furoate-vilanterol (BREO ELLIPTA ) 100-25 MCG/ACT AEPB Inhale 1 puff into the lungs daily.   Magnesium 250 MG TABS Two tablets q day   Multiple  Vitamins-Minerals (CENTRUM SILVER 50+MEN PO) Take 1 tablet by mouth daily.   omeprazole  (PRILOSEC) 20 MG capsule Take 1 capsule (20 mg total) by mouth 2 (two) times daily before a meal.   pramipexole  (MIRAPEX ) 0.5 MG tablet TAKE 1 TABLET BY MOUTH EVERYDAY AT BEDTIME   pravastatin  (PRAVACHOL ) 40 MG tablet TAKE 1 TABLET BY MOUTH EVERY DAY   [DISCONTINUED] albuterol  (VENTOLIN  HFA) 108 (90 Base) MCG/ACT inhaler Inhale 2 puffs into the lungs every 6 (six) hours as needed for wheezing or shortness of breath.   No facility-administered encounter medications on file as of 01/16/2024.     Lab Results  Component Value Date   WBC 5.9 01/16/2024   HGB 15.5 01/16/2024   HCT 45.8 01/16/2024   PLT 219.0 01/16/2024   GLUCOSE 87 01/16/2024   CHOL 164 01/16/2024   TRIG 74.0 01/16/2024   HDL 47.00 01/16/2024   LDLDIRECT 154.7 05/22/2013   LDLCALC 102 (H) 01/16/2024   ALT 17 01/16/2024   AST 46 (H) 01/16/2024   NA 140 01/16/2024   K 4.1 01/16/2024   CL 101 01/16/2024   CREATININE 1.07 01/16/2024   BUN 18 01/16/2024   CO2 32 01/16/2024   TSH 2.38 01/16/2024   PSA 1.18 01/16/2024   INR 1.00 11/17/2016    DG Bone Density Result Date: 08/20/2023 EXAM: DUAL X-RAY ABSORPTIOMETRY (DXA) FOR BONE MINERAL DENSITY 08/20/2023 9:26 am CLINICAL DATA:  78 year old Male Screening for osteoporosis TECHNIQUE: An axial (e.g., hips, spine) and/or appendicular (e.g., radius) exam was performed, as appropriate, using GE Secretary/administrator at [facility]. Images are obtained for bone mineral density measurement and are not obtained for diagnostic purposes. [id number] Exclusions: L3-L4 due to degenerative changes. COMPARISON:  None. FINDINGS: Scan quality: Good. LUMBAR  SPINE (L1-L2): BMD (in g/cm2): 0.890 T-score: -2.6 Z-score: -2.0 LEFT FEMORAL NECK: BMD (in g/cm2): 0.944 T-score: -1.0 Z-score: 0.5 LEFT TOTAL HIP: BMD (in g/cm2): 1.014 T-score: -0.6 Z-score: 0.4 RIGHT FEMORAL NECK: BMD (in g/cm2): 0.850 T-score: -1.7 Z-score: -0.3 RIGHT TOTAL HIP: BMD (in g/cm2): 0.915 T-score: -1.3 Z-score: -0.3 LEFT FOREARM (RADIUS 33%): BMD (in g/cm2): 1.012 T-score: 0.2 Z-score: 1.3 FRAX 10-YEAR PROBABILITY OF FRACTURE: FRAX not reported as the lowest BMD is not in the osteopenia range. IMPRESSION: Osteoporosis based on BMD. Fracture risk is increased. Increased risk is based on low BMD. RECOMMENDATIONS: 1. All patients should optimize calcium and vitamin D  intake. 2. Consider FDA-approved medical therapies in postmenopausal women and men aged 3 years and older, based on the following: - A hip or vertebral (clinical or morphometric) fracture - T-score less than or equal to -2.5 and secondary causes have been excluded. - Low bone mass (T-score between -1.0 and -2.5) and a 10-year probability of a hip fracture greater than or equal to 3% or a 10-year probability of a major osteoporosis-related fracture greater than or equal to 20% based on the [place] -adapted WHO algorithm. - Clinician judgment and/or patient preferences may indicate treatment for people with 10-year fracture probabilities above or below these levels 3. Patients with diagnosis of osteoporosis or at high risk for fracture should have regular bone mineral density tests. For patients eligible for Medicare, routine testing is allowed once every 2 years. The testing frequency can be increased to one year for patients who have rapidly progressing disease, those who are receiving or discontinuing medical therapy to restore bone mass, or have additional risk factors. Electronically Signed   By: Reyes Phi M.D.   On:  08/20/2023 11:45       Assessment & Plan:  Mild intermittent asthma without complication Assessment & Plan: Breathing  stable. Continue breo. Uses albuterol  prn. Follow.    Osteoporosis, unspecified osteoporosis type, unspecified pathological fracture presence  Prostate cancer screening -     PSA, Medicare  Hypercholesterolemia Assessment & Plan: Continue statin medication.  Low cholesterol diet and exercise.  Follow lipid panel.   Orders: -     TSH -     Hepatic function panel -     Lipid panel -     Basic metabolic panel with GFR -     CBC with Differential/Platelet  Gastroesophageal reflux disease, unspecified whether esophagitis present Assessment & Plan: No upper symptoms reported. Continue prilosec.    Sleep apnea, unspecified type Assessment & Plan: Uses cpap nightly. Uses more than 6 hours per night. Benefits from use. Continue cpap.    Other orders -     Flu vaccine HIGH DOSE PF(Fluzone Trivalent)     Allena Hamilton, MD

## 2024-01-17 LAB — CBC WITH DIFFERENTIAL/PLATELET
Basophils Absolute: 0 K/uL (ref 0.0–0.1)
Basophils Relative: 0.8 % (ref 0.0–3.0)
Eosinophils Absolute: 0.2 K/uL (ref 0.0–0.7)
Eosinophils Relative: 2.6 % (ref 0.0–5.0)
HCT: 45.8 % (ref 39.0–52.0)
Hemoglobin: 15.5 g/dL (ref 13.0–17.0)
Lymphocytes Relative: 23.2 % (ref 12.0–46.0)
Lymphs Abs: 1.4 K/uL (ref 0.7–4.0)
MCHC: 33.8 g/dL (ref 30.0–36.0)
MCV: 94.7 fl (ref 78.0–100.0)
Monocytes Absolute: 0.6 K/uL (ref 0.1–1.0)
Monocytes Relative: 9.9 % (ref 3.0–12.0)
Neutro Abs: 3.7 K/uL (ref 1.4–7.7)
Neutrophils Relative %: 63.5 % (ref 43.0–77.0)
Platelets: 219 K/uL (ref 150.0–400.0)
RBC: 4.84 Mil/uL (ref 4.22–5.81)
RDW: 13.5 % (ref 11.5–15.5)
WBC: 5.9 K/uL (ref 4.0–10.5)

## 2024-01-17 LAB — BASIC METABOLIC PANEL WITH GFR
BUN: 18 mg/dL (ref 6–23)
CO2: 32 meq/L (ref 19–32)
Calcium: 9.4 mg/dL (ref 8.4–10.5)
Chloride: 101 meq/L (ref 96–112)
Creatinine, Ser: 1.07 mg/dL (ref 0.40–1.50)
GFR: 67.18 mL/min (ref >60.00)
Glucose, Bld: 87 mg/dL (ref 70–99)
Potassium: 4.1 meq/L (ref 3.5–5.1)
Sodium: 140 meq/L (ref 135–145)

## 2024-01-17 LAB — LIPID PANEL
Cholesterol: 164 mg/dL (ref 0–200)
HDL: 47 mg/dL (ref >39.00)
LDL Cholesterol: 102 mg/dL — ABNORMAL HIGH (ref 0–99)
NonHDL: 117.03
Total CHOL/HDL Ratio: 3
Triglycerides: 74 mg/dL (ref 0.0–149.0)
VLDL: 14.8 mg/dL (ref 0.0–40.0)

## 2024-01-17 LAB — PSA, MEDICARE: PSA: 1.18 ng/mL (ref 0.10–4.00)

## 2024-01-17 LAB — HEPATIC FUNCTION PANEL
ALT: 17 U/L (ref 0–53)
AST: 46 U/L — ABNORMAL HIGH (ref 0–37)
Albumin: 4.3 g/dL (ref 3.5–5.2)
Alkaline Phosphatase: 59 U/L (ref 39–117)
Bilirubin, Direct: 0.1 mg/dL (ref 0.0–0.3)
Total Bilirubin: 0.6 mg/dL (ref 0.2–1.2)
Total Protein: 7 g/dL (ref 6.0–8.3)

## 2024-01-17 LAB — TSH: TSH: 2.38 u[IU]/mL (ref 0.35–5.50)

## 2024-01-20 ENCOUNTER — Ambulatory Visit: Payer: Self-pay | Admitting: Internal Medicine

## 2024-01-20 ENCOUNTER — Encounter: Payer: Self-pay | Admitting: Internal Medicine

## 2024-01-20 MED ORDER — ALBUTEROL SULFATE HFA 108 (90 BASE) MCG/ACT IN AERS: 2.0000 | INHALATION_SPRAY | Freq: Four times a day (QID) | RESPIRATORY_TRACT | 2 refills | Status: AC | PRN

## 2024-01-20 NOTE — Telephone Encounter (Signed)
 FYI- I have sent this In. I was on the phone with him for his lab results when this was sent to you. He is not sick, not having any acute symptoms. His current inhaler is expired and he would like to have one to keep at home as needed. He says this was discussed at his appt last week.

## 2024-01-25 ENCOUNTER — Encounter: Payer: Self-pay | Admitting: Internal Medicine

## 2024-01-25 NOTE — Assessment & Plan Note (Signed)
 Uses cpap nightly. Uses more than 6 hours per night. Benefits from use. Continue cpap.

## 2024-01-25 NOTE — Assessment & Plan Note (Signed)
 Continue statin medication. Low cholesterol diet and exercise. Follow lipid panel.

## 2024-01-25 NOTE — Assessment & Plan Note (Signed)
 Breathing stable. Continue breo. Uses albuterol  prn. Follow.

## 2024-01-25 NOTE — Assessment & Plan Note (Signed)
No upper symptoms reported.  Continue prilosec.  

## 2024-01-27 ENCOUNTER — Ambulatory Visit: Admitting: Internal Medicine

## 2024-03-05 ENCOUNTER — Encounter: Payer: Self-pay | Admitting: Internal Medicine

## 2024-03-06 MED ORDER — FLUTICASONE FUROATE-VILANTEROL 100-25 MCG/ACT IN AEPB: 1.0000 | INHALATION_SPRAY | Freq: Every day | RESPIRATORY_TRACT | 3 refills | Status: AC

## 2024-03-21 ENCOUNTER — Other Ambulatory Visit: Payer: Self-pay | Admitting: Internal Medicine

## 2024-03-31 ENCOUNTER — Other Ambulatory Visit: Payer: Self-pay | Admitting: Internal Medicine

## 2024-07-16 ENCOUNTER — Encounter: Admitting: Internal Medicine

## 2024-07-21 ENCOUNTER — Ambulatory Visit
